# Patient Record
Sex: Male | Born: 1980 | Race: White | Hispanic: No | Marital: Married | Smoking: Former smoker
Health system: Southern US, Community
[De-identification: ages and names within clinical notes are randomized; demographics above are authoritative.]

## PROBLEM LIST (undated history)

## (undated) DIAGNOSIS — F1910 Other psychoactive substance abuse, uncomplicated: Secondary | ICD-10-CM

## (undated) DIAGNOSIS — G43909 Migraine, unspecified, not intractable, without status migrainosus: Secondary | ICD-10-CM

## (undated) DIAGNOSIS — I10 Essential (primary) hypertension: Secondary | ICD-10-CM

## (undated) DIAGNOSIS — K449 Diaphragmatic hernia without obstruction or gangrene: Secondary | ICD-10-CM

## (undated) DIAGNOSIS — F419 Anxiety disorder, unspecified: Secondary | ICD-10-CM

## (undated) DIAGNOSIS — K269 Duodenal ulcer, unspecified as acute or chronic, without hemorrhage or perforation: Secondary | ICD-10-CM

## (undated) DIAGNOSIS — F322 Major depressive disorder, single episode, severe without psychotic features: Secondary | ICD-10-CM

## (undated) DIAGNOSIS — F1011 Alcohol abuse, in remission: Secondary | ICD-10-CM

## (undated) DIAGNOSIS — K259 Gastric ulcer, unspecified as acute or chronic, without hemorrhage or perforation: Secondary | ICD-10-CM

## (undated) HISTORY — DX: Other psychoactive substance abuse, uncomplicated: F19.10

## (undated) HISTORY — DX: Essential (primary) hypertension: I10

## (undated) HISTORY — PX: OTHER SURGICAL HISTORY: SHX169

## (undated) HISTORY — DX: Anxiety disorder, unspecified: F41.9

## (undated) HISTORY — PX: ORIF ANKLE FRACTURE: SHX5408

## (undated) HISTORY — PX: WISDOM TOOTH EXTRACTION: SHX21

## (undated) HISTORY — DX: Alcohol abuse, in remission: F10.11

## (undated) HISTORY — DX: Migraine, unspecified, not intractable, without status migrainosus: G43.909

## (undated) HISTORY — DX: Major depressive disorder, single episode, severe without psychotic features: F32.2

---
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
## 2001-01-30 ENCOUNTER — Ambulatory Visit (HOSPITAL_COMMUNITY): Admission: RE | Admit: 2001-01-30 | Discharge: 2001-01-30 | Payer: Self-pay | Admitting: Orthopedic Surgery

## 2001-08-27 ENCOUNTER — Emergency Department (HOSPITAL_COMMUNITY): Admission: EM | Admit: 2001-08-27 | Discharge: 2001-08-27 | Payer: Self-pay | Admitting: Emergency Medicine

## 2004-01-03 ENCOUNTER — Emergency Department (HOSPITAL_COMMUNITY): Admission: EM | Admit: 2004-01-03 | Discharge: 2004-01-03 | Payer: Self-pay

## 2004-01-03 ENCOUNTER — Emergency Department (HOSPITAL_COMMUNITY): Admission: EM | Admit: 2004-01-03 | Discharge: 2004-01-03 | Payer: Self-pay | Admitting: Emergency Medicine

## 2004-01-06 ENCOUNTER — Emergency Department (HOSPITAL_COMMUNITY): Admission: EM | Admit: 2004-01-06 | Discharge: 2004-01-06 | Payer: Self-pay | Admitting: Emergency Medicine

## 2004-01-10 ENCOUNTER — Emergency Department (HOSPITAL_COMMUNITY): Admission: EM | Admit: 2004-01-10 | Discharge: 2004-01-10 | Payer: Self-pay | Admitting: Emergency Medicine

## 2004-01-17 ENCOUNTER — Emergency Department (HOSPITAL_COMMUNITY): Admission: EM | Admit: 2004-01-17 | Discharge: 2004-01-17 | Payer: Self-pay | Admitting: Emergency Medicine

## 2004-01-31 ENCOUNTER — Emergency Department (HOSPITAL_COMMUNITY): Admission: EM | Admit: 2004-01-31 | Discharge: 2004-01-31 | Payer: Self-pay | Admitting: Emergency Medicine

## 2004-04-16 ENCOUNTER — Ambulatory Visit: Payer: Self-pay | Admitting: Internal Medicine

## 2004-08-28 ENCOUNTER — Ambulatory Visit: Payer: Self-pay | Admitting: Endocrinology

## 2005-01-17 ENCOUNTER — Ambulatory Visit: Payer: Self-pay | Admitting: Internal Medicine

## 2005-06-09 ENCOUNTER — Encounter: Admission: RE | Admit: 2005-06-09 | Discharge: 2005-06-09 | Payer: Self-pay | Admitting: Internal Medicine

## 2005-06-09 ENCOUNTER — Ambulatory Visit: Payer: Self-pay | Admitting: Internal Medicine

## 2005-09-22 ENCOUNTER — Emergency Department (HOSPITAL_COMMUNITY): Admission: EM | Admit: 2005-09-22 | Discharge: 2005-09-22 | Payer: Self-pay | Admitting: Emergency Medicine

## 2005-09-30 ENCOUNTER — Ambulatory Visit: Payer: Self-pay | Admitting: Internal Medicine

## 2006-05-21 ENCOUNTER — Ambulatory Visit: Payer: Self-pay | Admitting: Internal Medicine

## 2007-04-10 ENCOUNTER — Encounter: Payer: Self-pay | Admitting: *Deleted

## 2007-04-10 DIAGNOSIS — G43909 Migraine, unspecified, not intractable, without status migrainosus: Secondary | ICD-10-CM

## 2007-04-10 DIAGNOSIS — S61209A Unspecified open wound of unspecified finger without damage to nail, initial encounter: Secondary | ICD-10-CM | POA: Insufficient documentation

## 2007-04-10 HISTORY — DX: Migraine, unspecified, not intractable, without status migrainosus: G43.909

## 2009-07-04 ENCOUNTER — Encounter: Admission: RE | Admit: 2009-07-04 | Discharge: 2009-07-04 | Payer: Self-pay | Admitting: Sports Medicine

## 2009-07-04 ENCOUNTER — Encounter: Payer: Self-pay | Admitting: Internal Medicine

## 2009-09-13 ENCOUNTER — Ambulatory Visit: Payer: Self-pay | Admitting: Internal Medicine

## 2009-09-13 DIAGNOSIS — F322 Major depressive disorder, single episode, severe without psychotic features: Secondary | ICD-10-CM

## 2009-09-13 HISTORY — DX: Major depressive disorder, single episode, severe without psychotic features: F32.2

## 2009-10-02 ENCOUNTER — Telehealth: Payer: Self-pay | Admitting: Internal Medicine

## 2009-10-11 ENCOUNTER — Ambulatory Visit: Payer: Self-pay | Admitting: Internal Medicine

## 2010-04-25 NOTE — Assessment & Plan Note (Signed)
Summary: 3-4 wk f/u // #/ cd   Vital Signs:  Patient profile:   30 year old male Height:      70 inches Weight:      144 pounds BMI:     20.74 O2 Sat:      98 % on Room air Temp:     97.3 degrees F oral Pulse rate:   51 / minute BP sitting:   126 / 80  (left arm) Cuff size:   regular  Vitals Entered By: Bill Salinas CMA (October 11, 2009 3:11 PM)  O2 Flow:  Room air CC: follow-up visit/ ab   CC:  follow-up visit/ ab.  History of Present Illness: Patient presents for follow-up. He was started on fluoxetine June 23rd. In the interval the dose has been increased to 40mg  daily. He did have some diarrhea but has adjust his metamucil dose and now has a normal bowel pattern. He has not had any adverse effects and does report that he is doing better.   Current Medications (verified): 1)  Imitrex 100 Mg  Tabs (Sumatriptan Succinate) .... Take One Tablet As Needed 2)  Fluoxetine Hcl 40 Mg Caps (Fluoxetine Hcl) .Marland Kitchen.. 1 By Mouth Once Daily  Allergies (verified): No Known Drug Allergies PMH-FH-SH reviewed-no changes except otherwise noted  Review of Systems  The patient denies anorexia, weight loss, hoarseness, dyspnea on exertion, abdominal pain, and muscle weakness.    Physical Exam  General:  Well-developed,well-nourished,in no acute distress; alert,appropriate and cooperative throughout examination Eyes:  C&S clear Lungs:  normal respiratory effort.   Heart:  normal rate and regular rhythm.   Neurologic:  no tremor or restless leg Psych:  Oriented X3, good eye contact, and not anxious appearing.     Impression & Recommendations:  Problem # 1:  DEPRESSION, MAJOR, SEVERE (ICD-296.23) Doing much better on fluoxitine.   Plan - continue present meds for at least 4 months.           for sleep - zolpidem every 2nd or 3rd night.        Complete Medication List: 1)  Imitrex 100 Mg Tabs (Sumatriptan succinate) .... Take one tablet as needed 2)  Fluoxetine Hcl 40 Mg Caps (Fluoxetine  hcl) .Marland Kitchen.. 1 by mouth once daily 3)  Zolpidem Tartrate 10 Mg Tabs (Zolpidem tartrate) .Marland Kitchen.. 1 by mouth at bedtime as needed for insomnia Prescriptions: ZOLPIDEM TARTRATE 10 MG TABS (ZOLPIDEM TARTRATE) 1 by mouth at bedtime as needed for insomnia  #20 x 5   Entered and Authorized by:   Jacques Navy MD   Signed by:   Jacques Navy MD on 10/11/2009   Method used:   Print then Give to Patient   RxID:   [id number]

## 2010-04-25 NOTE — Progress Notes (Signed)
Summary: FLUOXETINE  Phone Note Call from Patient Call back at Home Phone [phone]   Summary of Call: Patient is requesting a call back regarding med given at last office visit.  Initial call taken by: Lamar Sprinkles, CMA,  October 02, 2009 11:46 AM  Follow-up for Phone Call        Spoke w/pt. He has been taking 2 fluoxetine daily in error, he did not read the bottle. Pt says that he is starting to feel relief from his depression. Pt is req rx for 2 pills daily, he is out of medication tomorrow.  Also c/o diarrhea from approx 4 am to 11 am daily since being on medication. Loose watery stools 5 to 6 daily. He has not taken the metmucil daily as directed at office visit. Advised to start and we would ask MD for further suggestions. He has tried to alternate time he takes meds w/no change in symptoms.  Follow-up by: Lamar Sprinkles, CMA,  October 02, 2009 4:32 PM  Additional Follow-up for Phone Call Additional follow up Details #1::        ok to rx fluoxetine 40mg  once daily. if diarrhea continues may need to change meds Additional Follow-up by: Jacques Navy MD,  October 02, 2009 6:45 PM    Additional Follow-up for Phone Call Additional follow up Details #2::    Pt informed, He says that he read the metamucil bottle and he was only taking 1 pill daily when it reads to take up to 5. He has started 2 metamucil pills two times a day and is starting to get relief from diarrhea.  Follow-up by: Lamar Sprinkles, CMA,  October 03, 2009 9:44 AM  New/Updated Medications: FLUOXETINE HCL 40 MG CAPS (FLUOXETINE HCL) 1 by mouth once daily Prescriptions: FLUOXETINE HCL 40 MG CAPS (FLUOXETINE HCL) 1 by mouth once daily  #90 x 1   Entered by:   Lamar Sprinkles, CMA   Authorized by:   Jacques Navy MD   Signed by:   Lamar Sprinkles, CMA on 10/03/2009   Method used:   [facility]  [place] [address]       [address]       [place], [place]  [address]       Ph: [phone] or  [phone]       Fax: [phone]   RxID:   [id number]

## 2010-04-25 NOTE — Assessment & Plan Note (Signed)
Summary: HEADACHE-- APPETITE LOSS--STC   Vital Signs:  Patient profile:   30 year old male Height:      70 inches Weight:      147 pounds BMI:     21.17 O2 Sat:      97 % on Room air Temp:     98.5 degrees F oral Pulse rate:   67 / minute BP sitting:   110 / 82  (left arm) Cuff size:   regular  Vitals Entered By: Bill Salinas CMA (September 13, 2009 3:43 PM)  O2 Flow:  Room air CC: Pt here with c/o migraines and diarrhea that he gets on a daily basis.He states that symptoms are gone by noon but leaves him extremely weak and fatigue. symptoms present x 4 weeks/ ab   CC:  Pt here with c/o migraines and diarrhea that he gets on a daily basis.He states that symptoms are gone by noon but leaves him extremely weak and fatigue. symptoms present x 4 weeks/ ab.  History of Present Illness: Patient presents ostensibly for morning headache and diarrhea. On interview his chief complaint is lack of sleep. He is not sure what is keeping him from sleeping: Donnald Garre is ok, work is ok, no new financial stress. He does admit to irritability, marked loss of appetite, decreased libido, disrupted sleep pattern, emotionality, difficulty with focus and attention and perseveration. He does have several loose stools in the morning and does have a fronto-occipital HA during the day. His fatigue and symptoms have resulted in many missed days of work.   He has been suffering with these symptoms for 4 weeks. He states that he used to think that seeking help for this type of problem was for men of weak character. He finally seeks help because of potential effect on his job and feeling miserable.  Current Medications (verified): 1)  Imitrex 100 Mg  Tabs (Sumatriptan Succinate) .... Take One Tablet As Needed  Allergies (verified): No Known Drug Allergies  Past History:  Past Medical History: Last updated: 04/10/2007 Hx of LACERATION OF FINGER (ICD-883.0) MIGRAINE HEADACHE (ICD-346.90)    Past Surgical  History: Last updated: 04/10/2007 * Hx of SURGICAL REPAIR FOR  LACERATION TO MCP  JOINT RIGHT THUMB.  Review of Systems       The patient complains of anorexia, weight loss, headaches, abdominal pain, and depression.  The patient denies fever, vision loss, chest pain, syncope, dyspnea on exertion, melena, hematochezia, severe indigestion/heartburn, muscle weakness, difficulty walking, abnormal bleeding, and enlarged lymph nodes.    Physical Exam  General:  Alert but anxious appearing thin white male in no acute distress Head:  normocephalic and atraumatic.   Eyes:  C&S clear Lungs:  normal respiratory effort.   Heart:  normal rate and regular rhythm.   Neurologic:  alert & oriented X3, cranial nerves II-XII intact, and gait normal.   Skin:  turgor normal and color normal.   Psych:  Oriented X3, memory intact for recent and remote, normally interactive, and poor eye contact.  Appears moderately anxious.   Impression & Recommendations:  Problem # 1:  DEPRESSION, MAJOR, SEVERE (ICD-296.23) Patient with strongly positive inventory of vegative signs of depression. He denies any suicidal ideation. Long discussion about depression: onset- physiologic causes, i.e. neurochemical imbalance and need for treatment with medications and possibly counseling.  Plan - fluoxetine 20mg  once a day           return in 3 weeks for follow-up.  Complete Medication List: 1)  Imitrex 100 Mg Tabs (Sumatriptan succinate) .... Take one tablet as needed 2)  Fluoxetine Hcl 20 Mg Caps (Fluoxetine hcl) .Marland Kitchen.. 1 by mouth daily  Patient Instructions: 1)  Depression - many symptoms of depression: irritability, poor appetite, change in sleep pattern, decrease sex drive, worry, lack of fun and pleasure, diffiuclty focusing. Plan - start on fluoxetine, a serotonin uptake inhibitor that will help resolve the symptoms. May need a higher dose. Come back for follow up in 3 -4 weeks, sooner for any problems. For the guts in  addition to the fluoxetine take metamusil or a similar product once a day in the evening.  Prescriptions: FLUOXETINE HCL 20 MG CAPS (FLUOXETINE HCL) 1 by mouth daily  #30 x 2   Entered and Authorized by:   Jacques Navy MD   Signed by:   Jacques Navy MD on 09/13/2009   Method used:   [facility]  [place] [address]       [address]       [place], [place]  [address]       Ph: [phone] or [phone]       Fax: [phone]   RxID:   [id number]

## 2010-04-25 NOTE — Letter (Signed)
Summary: [facility]  [facility]   Imported By: Lester  07/24/2009 11:44:52  _____________________________________________________________________  External Attachment:    Type:   Image     Comment:   External Document

## 2010-05-03 ENCOUNTER — Ambulatory Visit (HOSPITAL_BASED_OUTPATIENT_CLINIC_OR_DEPARTMENT_OTHER)
Admission: RE | Admit: 2010-05-03 | Discharge: 2010-05-03 | Disposition: A | Discharge status: Home / Self Care | Payer: Worker's Compensation | Source: Ambulatory Visit | Attending: Orthopedic Surgery | Admitting: Orthopedic Surgery

## 2010-05-03 DIAGNOSIS — X58XXXA Exposure to other specified factors, initial encounter: Secondary | ICD-10-CM | POA: Insufficient documentation

## 2010-05-03 DIAGNOSIS — S61209A Unspecified open wound of unspecified finger without damage to nail, initial encounter: Secondary | ICD-10-CM | POA: Insufficient documentation

## 2010-05-03 LAB — POCT HEMOGLOBIN-HEMACUE: Hemoglobin: 15.9 g/dL (ref 13.0–17.0)

## 2010-05-05 LAB — WOUND CULTURE
Culture: NO GROWTH
Gram Stain: NONE SEEN

## 2010-05-08 LAB — ANAEROBIC CULTURE: Gram Stain: NONE SEEN

## 2010-05-13 NOTE — Op Note (Signed)
  NAMERIGEL, FILSINGER              ACCOUNT NO.:  [id number]  MEDICAL RECORD NO.:  [id number]            PATIENT TYPE:  LOCATION:                                 FACILITY:  PHYSICIAN:  Cindee Salt, M.D.            DATE OF BIRTH:  DATE OF PROCEDURE:  05/03/2010 DATE OF DISCHARGE:                              OPERATIVE REPORT   PREOPERATIVE DIAGNOSIS:  Laceration, proximal interphalangeal joint, right little finger.  POSTOPERATIVE DIAGNOSIS:  Laceration, proximal interphalangeal joint, right little finger.  OPERATION:  Incision and drainage with cultures, proximal interphalangeal, right small finger.  It is noted that he has a laceration to the extensor tendon.  SURGEON:  Cindee Salt, MD  ANESTHESIA:  IV regional with local infiltration.  ANESTHESIOLOGIST:  Janetta Hora. Gelene Mink, MD  HISTORY:  The patient is a 30 year old male who suffered a laceration over the PIP joint of his right little finger.  This was not treated primarily.  He is seen approximately 24 hours later.  He complains of pain with resisted extension of his finger with swelling, pain with motion of PIP joint.  X-rays were negative.  He was advised of exploration, incision and drainage with debridement, irrigation of the PIP joint for an open joint.  Pre, peri, and postoperative course were discussed.  In the preoperative area, the patient is seen, extremity is marked by both the patient and surgeon, and antibiotic is given.  PROCEDURE:  The patient was brought to the operating room where a forearm-based IV regional anesthetic was carried out without difficulty. He was prepped using ChloraPrep, supine position, right arm free.  A 3- minute dry time was allowed.  A time-out was taken confirming the patient and procedure.  The wound was extended proximally and distally and carried down through the subcutaneous tissue.  A laceration of the extensor tendon was immediately apparent.  This was extended slightly  to gain increased access to the joint.  The joint was copiously irrigated with saline after cultures were taken for both aerobic and anaerobic cultures.  Again, the wound was irrigated.  A vessel loop drain was placed through the extensor tendon into the joint.  A sterile compressive dressing splint to the little finger was applied.  On deflation of the tourniquet, all fingers immediately pinked.  He was taken to the recovery room for observation in satisfactory condition.  He will be discharged to home to return to the [facility] [place] on Tuesday, on Talwin NX and Septra DS.          ______________________________ Cindee Salt, M.D.     GK/MEDQ  D:  05/03/2010  T:  05/04/2010  Job:  [id number]  Electronically Signed by Cindee Salt M.D. on 05/13/2010 02:25:57 PM

## 2010-05-27 ENCOUNTER — Ambulatory Visit (HOSPITAL_BASED_OUTPATIENT_CLINIC_OR_DEPARTMENT_OTHER)
Admission: RE | Admit: 2010-05-27 | Discharge: 2010-05-27 | Disposition: A | Discharge status: Home / Self Care | Payer: Worker's Compensation | Source: Ambulatory Visit | Attending: Orthopedic Surgery | Admitting: Orthopedic Surgery

## 2010-05-27 DIAGNOSIS — F17200 Nicotine dependence, unspecified, uncomplicated: Secondary | ICD-10-CM | POA: Insufficient documentation

## 2010-05-27 DIAGNOSIS — S61209A Unspecified open wound of unspecified finger without damage to nail, initial encounter: Secondary | ICD-10-CM | POA: Insufficient documentation

## 2010-05-27 DIAGNOSIS — G43909 Migraine, unspecified, not intractable, without status migrainosus: Secondary | ICD-10-CM | POA: Insufficient documentation

## 2010-05-27 DIAGNOSIS — X58XXXA Exposure to other specified factors, initial encounter: Secondary | ICD-10-CM | POA: Insufficient documentation

## 2010-06-19 LAB — AFB CULTURE WITH SMEAR (NOT AT ARMC)

## 2010-08-09 NOTE — Op Note (Signed)
[facility]  Patient:    Trevor Mendez, Trevor Mendez Visit Number: [id number] MRN: [id number]          Service Type: DSU Location: DAY Attending Physician:  Dominica Severin Dictated by:   Elisha Ponder, M.D. Admit Date:  01/30/2001 Discharge Date: 01/30/2001                             Operative Report  DATE OF BIRTH:  09/05/1980.  PREOPERATIVE DIAGNOSES:  Right thumb dorsal laceration, status post on-the-job injury with bone and tendon involvement.  POSTOPERATIVE DIAGNOSES:  Right thumb dorsal laceration, status post on-the-job injury with bone and tendon involvement (extensor pollicis longus laceration) and cutting injury down to the bone and periosteum with encroachment of the bone and periosteum, intact superficial radially nerve branches radially and ulnarly explored intraoperatively.  OPERATION/PROCEDURE: 1. Irrigation and debridement, right thumb, skin and subcutaneous tissue,    tendinous tissue and bone including the periosteum. 2. Repair extensor pollicis longus, dorsal right thumb with associated    sagittal band.  Repair as well with multiple Mersilene throws. 3. Exploration of the radial and ulnar superficial radial nerve branches.    This was noted to be intact.  SURGEON:  Elisha Ponder, M.D.  ASSISTANT:  Sherri Rad.  ANESTHESIA:  General.  COMPLICATIONS:  None.  TOURNIQUET TIME:  Less than an hour.  INDICATIONS:  This patient is a 30 year old male who sustained laceration to his dorsal thumb 48 hours ago.  He has been seen by myself.  I have counseled him regarding the risks and benefits of surgery including the risk of amputation, bleeding, anesthesia, damage to normal structures, further surgery, damage to nerves and/or infection.  With this in mind, he desires to proceed.  All questions had been encouraged and answered preoperatively.  OPERATIVE FINDINGS:  This patient had complete extensor pollicis longus laceration  as well as sagittal band disruptions.  He had encroachment down to the periosteum and including the bone of the first metacarpal.  He underwent I&D repair of the EPL and exploration of the superficial radial nerve radially and ulnarly.  These branches were noted to be intact and there was no major nerve damage.  DESCRIPTION OF PROCEDURE:  The patient was identified by myself and anesthesia, taken to the operative suite, underwent smooth induction of general anesthesia, laid supine, appropriately padded, prepped and draped in the usual sterile fashion.  The right upper extremity with Betadine scrub and paint.  Following this, the arm arm was elevated, tourniquet was insufflated to 250 mmHg.  Previous sutures placed in urgent care were removed and the patient then underwent a I&D of the skin and subcutaneous tissue, bone, periosteum and tendon.  The patient tolerated this without difficulty.  There were no complications.  There was no significant dirt in the wound.  I did remove 1 mm of skin edge including subcu.  Following this and copious I&D of the wound, the patient underwent exploration of the superficial radial nerve radially and ulnarly. These nerve branches were noted to be intact without significant abnormalities.  I did dissect both of these to show that they were not lacerated as the patient did have some preoperative numbness.  These were noted to be intact.  After exploration, the patient then underwent repair of the periosteum with interrupted 5-0 chromic followed by repair of the extensor pollicis longus and sagittal band with multiple interrupted Mersilene throws. He tolerated this  without difficulty.  He had excellent sound repair. Following this, the patient had the tourniquet deflated.  Hemostasis was obtained with bipolar electrocautery and further irrigation was applied to the wound.  The patient then underwent closure of the wound with interrupted Prolene.  He tolerated  the procedure well without difficulty. There were no complications and all sponge and instrument counts were reported as correct.  He was placed in a sterile dressing with thumb spica splint.  The thumb was in full extension and the wrist was in extension as well.  He tolerated this well.  He was transported to the recovery room in stable condition.  All sponge and instrument counts reported as correct.  DISPOSITION:  The patient will be monitored in recovery care area.  He will then be discharged home.  He will follow up in 10-14 days and notify us if there are any problems.  All questions have been encouraged and answered. Dictated by:   Elisha Ponder, M.D. Attending Physician:  Dominica Severin DD:  01/30/01 TD:  02/01/01 Job: [id number]

## 2010-08-09 NOTE — Assessment & Plan Note (Signed)
[facility]                                 ON-CALL NOTE   DARRIE, MACMILLAN                    MRN:          [id number]  DATE:11/10/2006                            DOB:          06/09/1980    The caller is Waunita Schooner.  Primary is Dr. Debby Bud.  Phone number [phone].   SUBJECTIVE:  Mr. Polak thought he was taking some Demerol earlier today  at around 6 p.m. for wisdom tooth extraction. He by accident took his  father's blood pressure medication Toprol XL 100 mg. He himself is also  on blood pressure medication and accidentally again later in the evening  took his 10 mg beta blocker. In addition to this he has also had 6 beers  this evening. His friend is calling now because he is sleeping. She has  been taking his pulse and it has been above 60 but she does not have a  way to check blood pressure at this time. She can get a blood pressure  cuff from his parents.   ASSESSMENT/PLAN:  I notified her that the Toprol XL will stay in his  system for 24 hours. If his blood pressure was going to be low it was  probably low now. If she can get the blood pressure cuff, and verify  that his blood pressure is not less than 90/60 then he is likely safe to  stay at home, and continue to follow blood pressure and pulse. I  encouraged her to have him elevate his feet above his heart. If his  blood pressure is low, I instructed her to take him to the emergency  room.     Kerby Nora, MD  Electronically Signed    AB/MedQ  DD: 11/10/2006  DT: 11/11/2006  Job #: [id number]

## 2010-10-11 NOTE — Op Note (Signed)
  [name], Trevor              ACCOUNT NO.:  [id number]  MEDICAL RECORD NO.:  [id number]          PATIENT TYPE:  LOCATION:                                 FACILITY:  PHYSICIAN:  Cindee Salt, M.D.            DATE OF BIRTH:  DATE OF PROCEDURE:  05/27/2010 DATE OF DISCHARGE:                              OPERATIVE REPORT   PREOPERATIVE DIAGNOSIS:  Delayed repair, extensor tendon, right little finger.  POSTOPERATIVE DIAGNOSIS:  Delayed repair, extensor tendon, right little finger.  OPERATION:  Delayed repair, extensor tendon, right little finger.  SURGEON:  Cindee Salt, MD  ASSISTANT:  None.  ANESTHESIA:  General with local infiltration.  ANESTHESIOLOGIST:  Bedelia Person, MD.  HISTORY:  The patient is a 30 year old male who suffered a laceration over the PIP joint, right little finger.  He did not seek medical attention initially with a delayed presentation.  Incision and drainage was performed with irrigation of the joint.  He is admitted now for repair to the extensor tendon.  Pre, peri, and postoperative course have been discussed along with risks and complications.  He is aware there is no guarantee with surgery, possibility of infection, recurrence injury to arteries, nerves, tendons, incomplete relief of symptoms, dystrophy, possibility of loss of mobility.  In the preoperative area, the patient is seen, the extremity marked by both the patient and surgeon. Antibiotic given.  PROCEDURE:  The patient was brought to the operating room where general anesthetic was carried out without difficulty, was prepped using ChloraPrep, supine position, right arm free.  A 3-minute dry time was allowed.  Time-out taken confirming the patient and procedure.  The limb was exsanguinated with an Esmarch bandage.  Tourniquet placed high on the arm  was inflated to 250 mmHg.  The old incision was opened, extended proximally and distally.  The laceration to the extensor tendon,  approximately half of the central slip was noted.  This was opened, irrigated.  He had some feeling.  A metacarpal block was given, 0.25% Marcaine without epinephrine, 7 mL was used.  The repair was then performed with figure-of-eight 4-0 Mersilene sutures.  The wound again irrigated and the skin closed with interrupted 5-0 Vicryl Rapide sutures.  Sterile compressive dressing and splint to the finger was applied. Deflation of the tourniquet, all fingers immediately pinked.  He was taken to the recovery room for observation in satisfactory condition. He will be discharged home to return to the [facility] of [place] in 1 week on Talwin NX.          ______________________________ Cindee Salt, M.D.     GK/MEDQ  D:  05/27/2010  T:  05/28/2010  Job:  [id number]  Electronically Signed by Cindee Salt M.D. on 10/11/2010 09:00:57 AM

## 2011-10-08 ENCOUNTER — Ambulatory Visit: Payer: Self-pay | Admitting: Internal Medicine

## 2011-10-08 ENCOUNTER — Telehealth: Payer: Self-pay

## 2011-10-08 NOTE — Telephone Encounter (Signed)
Spouse called stating that pt had appt scheduled today with VAL @ 1:30 for N/V/D but was unable to make it. Pt is requesting Rx suppository to treat nausea, last OV 10/11/2009, please advise.

## 2011-10-09 ENCOUNTER — Ambulatory Visit (INDEPENDENT_AMBULATORY_CARE_PROVIDER_SITE_OTHER): Payer: Managed Care, Other (non HMO) | Admitting: Internal Medicine

## 2011-10-09 ENCOUNTER — Other Ambulatory Visit (INDEPENDENT_AMBULATORY_CARE_PROVIDER_SITE_OTHER): Payer: Managed Care, Other (non HMO)

## 2011-10-09 ENCOUNTER — Encounter: Payer: Self-pay | Admitting: Internal Medicine

## 2011-10-09 VITALS — BP 132/102 | HR 65 | Temp 97.5°F | Ht 70.0 in | Wt 147.5 lb

## 2011-10-09 DIAGNOSIS — R112 Nausea with vomiting, unspecified: Secondary | ICD-10-CM

## 2011-10-09 DIAGNOSIS — F322 Major depressive disorder, single episode, severe without psychotic features: Secondary | ICD-10-CM

## 2011-10-09 DIAGNOSIS — I10 Essential (primary) hypertension: Secondary | ICD-10-CM

## 2011-10-09 DIAGNOSIS — R197 Diarrhea, unspecified: Secondary | ICD-10-CM | POA: Insufficient documentation

## 2011-10-09 HISTORY — DX: Essential (primary) hypertension: I10

## 2011-10-09 LAB — CBC WITH DIFFERENTIAL/PLATELET
Basophils Absolute: 0 10*3/uL (ref 0.0–0.1)
Basophils Relative: 0.3 % (ref 0.0–3.0)
Eosinophils Absolute: 0.2 10*3/uL (ref 0.0–0.7)
Eosinophils Relative: 1.9 % (ref 0.0–5.0)
HCT: 53 % — ABNORMAL HIGH (ref 39.0–52.0)
Hemoglobin: 17.6 g/dL — ABNORMAL HIGH (ref 13.0–17.0)
Lymphocytes Relative: 14.4 % (ref 12.0–46.0)
Lymphs Abs: 1.2 10*3/uL (ref 0.7–4.0)
MCHC: 33.2 g/dL (ref 30.0–36.0)
MCV: 87.9 fl (ref 78.0–100.0)
Monocytes Absolute: 0.8 10*3/uL (ref 0.1–1.0)
Monocytes Relative: 10 % (ref 3.0–12.0)
Neutro Abs: 6 10*3/uL (ref 1.4–7.7)
Neutrophils Relative %: 73.4 % (ref 43.0–77.0)
Platelets: 263 10*3/uL (ref 150.0–400.0)
RBC: 6.02 Mil/uL — ABNORMAL HIGH (ref 4.22–5.81)
RDW: 14.3 % (ref 11.5–14.6)
WBC: 8.2 10*3/uL (ref 4.5–10.5)

## 2011-10-09 LAB — HEPATIC FUNCTION PANEL
ALT: 37 U/L (ref 0–53)
AST: 30 U/L (ref 0–37)
Albumin: 4.8 g/dL (ref 3.5–5.2)
Alkaline Phosphatase: 71 U/L (ref 39–117)
Bilirubin, Direct: 0.3 mg/dL (ref 0.0–0.3)
Total Bilirubin: 2.5 mg/dL — ABNORMAL HIGH (ref 0.3–1.2)
Total Protein: 7.9 g/dL (ref 6.0–8.3)

## 2011-10-09 LAB — BASIC METABOLIC PANEL
BUN: 15 mg/dL (ref 6–23)
CO2: 27 mEq/L (ref 19–32)
Calcium: 9.8 mg/dL (ref 8.4–10.5)
Chloride: 99 mEq/L (ref 96–112)
Creatinine, Ser: 1.1 mg/dL (ref 0.4–1.5)
GFR: 81.01 mL/min (ref >60.00)
Glucose, Bld: 125 mg/dL — ABNORMAL HIGH (ref 70–99)
Potassium: 4.7 mEq/L (ref 3.5–5.1)
Sodium: 138 mEq/L (ref 135–145)

## 2011-10-09 MED ORDER — PROMETHAZINE HCL 25 MG PO TABS: 25.0000 mg | ORAL_TABLET | Freq: Four times a day (QID) | ORAL | Status: DC | PRN

## 2011-10-09 MED ORDER — AMLODIPINE BESYLATE 5 MG PO TABS: 5.0000 mg | ORAL_TABLET | Freq: Every day | ORAL | Status: DC

## 2011-10-09 MED ORDER — FLUOXETINE HCL 20 MG PO CAPS: 20.0000 mg | ORAL_CAPSULE | Freq: Every day | ORAL | Status: DC

## 2011-10-09 MED ORDER — DIPHENOXYLATE-ATROPINE 2.5-0.025 MG PO TABS: 1.0000 | ORAL_TABLET | Freq: Four times a day (QID) | ORAL | Status: AC | PRN

## 2011-10-09 NOTE — Telephone Encounter (Signed)
Ok for phenergan 25 mg suppository # 12, 1 pr q 6. STONGLY advise OV for his problem.

## 2011-10-09 NOTE — Assessment & Plan Note (Signed)
Incidental, for amlod 5 qd to start next wk when improved from current illness, f/u next visit with PCP

## 2011-10-09 NOTE — Assessment & Plan Note (Addendum)
Milder at this time, but start prozac 20 qd, f/u 4 wks, consider increase to 40 as was needed previously, declines need for counsseling at this time

## 2011-10-09 NOTE — Patient Instructions (Addendum)
Take all new medications as prescribed - the phenergan for nausea, and the lomotil for diarrhea The amlodipine for blood pressure, and the generic prozac can be started next Monday when you should start to feel better Please go to LAB in the Basement for the blood and/or urine tests to be done today You will be contacted by phone if any changes need to be made immediately.  Otherwise, you will receive a letter about your results with an explanation. You are given the work note today Please return in 1 month, to Dr Debby Bud

## 2011-10-09 NOTE — Progress Notes (Signed)
  Subjective:    Patient ID: Trevor Mendez, male    DOB: 12/13/1980, 31 y.o.   MRN: [id number]  HPI  Here with acute illness; awoke sat am (5 days ago) with n/v/d - watery without abd pain, blood, or fever;  Has felt occasional chill;  Denies urinary symptoms such as dysuria, frequency, urgency,or hematuria.  Also with significant fatigue, and has lost from 158 to 147 today with decreased po intake solids and liquids.  Denies worsening reflux, dysphagia, abd pain, n/v.  No back pain, HA, rash or joint pain.  No sick contacts at work or home.  No prior hx.  Incidently notes BP elevated several occasions at work before current illness over the past 6 mo, similar to BP today. Also with mild worsening depressive symptoms, fatigue, similar to major depression in the past  - tx with prozac 40 for 6 mo;  Denies worsening suicidal ideation, or panic Past Medical History  Diagnosis Date  . MIGRAINE HEADACHE 04/10/2007    Qualifier: Diagnosis of  By: Genelle Gather CMA, [place]    . DEPRESSION, MAJOR, SEVERE 09/13/2009    Qualifier: Diagnosis of  By: Debby Bud MD, Rosalyn Gess   . HTN (hypertension) 10/09/2011   Past Surgical History  Procedure Date  . Finger surgury     reports that he has been smoking.  He uses smokeless tobacco. He reports that he drinks alcohol. He reports that he does not use illicit drugs. family history includes Hypertension in his father and mother. Allergies  Allergen Reactions  . Codeine    No current outpatient prescriptions on file prior to visit.   Review of Systems Constitutional: Negative for diaphoresis  HENT: Negative for tinnitus.   Eyes: Negative for photophobia and visual disturbance.  Respiratory: Negative for choking and stridor.   Gastrointestinal: Negative for blood in stool.  Genitourinary: Negative for hematuria and decreased urine volume.  Musculoskeletal: Negative for gait problem.  Skin: Negative for color change and wound. no rash Neurological: Negative for  tremors and numbness.  Psychiatric/Behavioral: Negative for decreased concentration. The patient is not hyperactive.      Objective:   Physical Exam Blood pressure 132/102, pulse 65, temperature 97.5 F (36.4 C), temperature source Oral, height 5\' 10"  (1.778 m), weight 147 lb 8 oz (66.906 kg), SpO2 97.00%. Physical Exam  VS noted, fatigued, mild ill appearing Constitutional: Pt appears well-developed and well-nourished.  HENT: Head: Normocephalic.  Right Ear: External ear normal.  Left Ear: External ear normal.  Bilat tm's mild erythema.  Sinus nontender.  Pharynx mild erythema Eyes: Conjunctivae and EOM are normal. Pupils are equal, round, and reactive to light.  Neck: Normal range of motion. Neck supple.  Cardiovascular: Normal rate and regular rhythm.   Pulmonary/Chest: Effort normal and breath sounds normal.  Abd:  Soft, NT, non-distended, + BS -  benign Neurological: Pt is alert. No confused No joint swelling  Skin: Skin is warm. No erythema. No rash Psychiatric: Pt behavior is normal. Thought content normal. but + depressed affect    Assessment & Plan:

## 2011-10-09 NOTE — Assessment & Plan Note (Signed)
C/w gastroenteritis as above, prob viral, for prn lomotil, check cbc, for work note as well

## 2011-10-09 NOTE — Assessment & Plan Note (Signed)
5 days with watery diarrhea, o/w bening exam - c/w prob acute gastroenteritis, prob viral most likely, for bmet today but o/w phenergan prn

## 2011-10-10 NOTE — Telephone Encounter (Signed)
Patient states saw Dr. Milton Ferguson for this problem and is feeling better

## 2011-11-11 ENCOUNTER — Ambulatory Visit (INDEPENDENT_AMBULATORY_CARE_PROVIDER_SITE_OTHER): Payer: Managed Care, Other (non HMO) | Admitting: Internal Medicine

## 2011-11-11 DIAGNOSIS — I10 Essential (primary) hypertension: Secondary | ICD-10-CM

## 2011-11-11 DIAGNOSIS — F322 Major depressive disorder, single episode, severe without psychotic features: Secondary | ICD-10-CM

## 2011-11-11 MED ORDER — HYDROCHLOROTHIAZIDE 12.5 MG PO CAPS: 12.5000 mg | ORAL_CAPSULE | Freq: Every day | ORAL | Status: DC

## 2011-11-11 NOTE — Patient Instructions (Addendum)
Blood pressure - not quite there - need to be 130's over 80's. Plan - add HCTZ 12.,5 mg once a day to the amlodipine           Continue to check BP at home - call in if the numbers are 1`40+ over 90+ and we can make adjustments without an office visit  Psych - glad you are doing better on the prozac. In terms of "what else you can do" give serious consideration to short term problem focused counseling to understand what is driving you and tools you can add to your tool box in order to cope better and possibly do better on less or no medication. IT IS HARD WORK TO DO COUNSELING!!! You have to be brave and be willing to face you inner demons.

## 2011-11-11 NOTE — Progress Notes (Signed)
  Subjective:    Patient ID: Scarlette Ar, male    DOB: [birth year], 31 y.o.   MRN: [id number]  HPI Mr. Stohr returns for follow-up hypertension- recently started on Amlodipine 5 mg. He continues to have DBP with SBP in the 140 range.   He feels that he is doing better on the Prozac 20 mg bid.   Past Medical History  Diagnosis Date  . MIGRAINE HEADACHE 04/10/2007    Qualifier: Diagnosis of  By: Genelle Gather CMA, [place]    . DEPRESSION, MAJOR, SEVERE 09/13/2009    Qualifier: Diagnosis of  By: Debby Bud MD, Rosalyn Gess   . HTN (hypertension) 10/09/2011   Past Surgical History  Procedure Date  . Finger surgury    Family History  Problem Relation Age of Onset  . Hypertension Mother   . Hypertension Father    History   Social History  . Marital Status: Married    Spouse Name: N/A    Number of Children: 1  . 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 Smoking status: Current Everyday Smoker  . Smokeless tobacco: Current User  . Alcohol Use: Yes     occasoinal social  . Drug Use: No  . Sexually Active: Yes -- Male partner(s)   Other Topics Concern  . 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   Current Outpatient Prescriptions on File Prior to Visit  Medication Sig Dispense Refill  . amLODipine (NORVASC) 5 MG tablet Take 1 tablet (5 mg total) by mouth daily.  90 tablet  3  . FLUoxetine (PROZAC) 20 MG capsule Take 1 capsule (20 mg total) by mouth daily.  90 capsule  3  . hydrochlorothiazide (MICROZIDE) 12.5 MG capsule Take 1 capsule (12.5 mg total) by mouth daily.  30 capsule  11  . promethazine (PHENERGAN) 25 MG tablet Take 1 tablet (25 mg total) by mouth every 6 (six) hours as needed for nausea.  40 tablet  0       Review of Systems System review is negative for any constitutional, cardiac, pulmonary, GI or neuro symptoms or complaints other  than as described in the HPI.     Objective:   Physical Exam BP 150/100, on repeat 148/88 Gen'l - WNWD white man in no distress HEENT- C&S cler Cor- RRR Pulm normal respirations Pscyh - calm, animated affect       Assessment & Plan:

## 2011-11-12 ENCOUNTER — Encounter: Payer: Self-pay | Admitting: Internal Medicine

## 2011-11-12 MED ORDER — FLUOXETINE HCL 20 MG PO CAPS: 20.0000 mg | ORAL_CAPSULE | Freq: Two times a day (BID) | ORAL | Status: DC

## 2011-11-12 NOTE — Assessment & Plan Note (Signed)
Strong family history of HTN. Few life-style factors. Tolerating medication but not at good level of control. He asked about duration of therapy - given lack of modifiable life style issues and family history he is informed of the probable need for life-time therapy. Discussed the chronic nature of disease and the long term effects.  Plan Continue amlodipine 5 mg daily  Add HCTZ 12.5 mg daily  Monitor BP at home and report back.

## 2011-11-12 NOTE — Assessment & Plan Note (Signed)
Recurrent depression. He has plenty of stressor but there may be a neurochemical basis. He is interested in adjunct treatments to medication. Discussed the value of therapeutic counseling and how it may help with insight and development of helpful coping mechanisms.  Plan - continue Prozac 20 mg BID  He will consider counseling and investigate his insurance coverage then get back in touch.  Follow-up in 3 months

## 2011-11-20 ENCOUNTER — Other Ambulatory Visit: Payer: Self-pay | Admitting: *Deleted

## 2011-11-20 DIAGNOSIS — F322 Major depressive disorder, single episode, severe without psychotic features: Secondary | ICD-10-CM

## 2011-11-20 MED ORDER — FLUOXETINE HCL 20 MG PO CAPS: 20.0000 mg | ORAL_CAPSULE | Freq: Two times a day (BID) | ORAL | Status: DC

## 2011-11-20 NOTE — Telephone Encounter (Signed)
Received fax stating pt states fluoxetine has been increase to two capsule daily. Need new rx .../LMB

## 2011-11-26 ENCOUNTER — Encounter: Payer: Self-pay | Admitting: Internal Medicine

## 2011-11-26 ENCOUNTER — Ambulatory Visit (INDEPENDENT_AMBULATORY_CARE_PROVIDER_SITE_OTHER): Payer: Managed Care, Other (non HMO) | Admitting: Internal Medicine

## 2011-11-26 VITALS — BP 104/82 | HR 86 | Temp 98.7°F | Resp 16 | Wt 148.0 lb

## 2011-11-26 DIAGNOSIS — I10 Essential (primary) hypertension: Secondary | ICD-10-CM

## 2011-11-26 DIAGNOSIS — F322 Major depressive disorder, single episode, severe without psychotic features: Secondary | ICD-10-CM

## 2011-11-26 DIAGNOSIS — G43909 Migraine, unspecified, not intractable, without status migrainosus: Secondary | ICD-10-CM

## 2011-11-26 MED ORDER — KETOROLAC TROMETHAMINE 10 MG PO TABS: 10.0000 mg | ORAL_TABLET | Freq: Four times a day (QID) | ORAL | Status: AC | PRN

## 2011-11-26 MED ORDER — ALPRAZOLAM 0.5 MG PO TABS: 0.5000 mg | ORAL_TABLET | Freq: Four times a day (QID) | ORAL | Status: AC | PRN

## 2011-11-26 NOTE — Patient Instructions (Addendum)
Headache and symptoms that are consistent with anxiety with panic. Plan For headache pain take ketorolac 10 mg every 6 hours as needed  For breakthrough anxiety take alprazolam 0.5 mg every 6 hours as needed; for panic take 1 mg (2 tabs).  Anxiety and Panic Attacks Your caregiver has informed you that you are having an anxiety or panic attack. There may be many forms of this. Most of the time these attacks come suddenly and without warning. They come at any time of day, including periods of sleep, and at any time of life. They may be strong and unexplained. Although panic attacks are very scary, they are physically harmless. Sometimes the cause of your anxiety is not known. Anxiety is a protective mechanism of the body in its fight or flight mechanism. Most of these perceived danger situations are actually nonphysical situations (such as anxiety over losing a job). CAUSES   The causes of an anxiety or panic attack are many. Panic attacks may occur in otherwise healthy people given a certain set of circumstances. There may be a genetic cause for panic attacks. Some medications may also have anxiety as a side effect. SYMPTOMS   Some of the most common feelings are:  Intense terror.   Dizziness, feeling faint.   Hot and cold flashes.   Fear of going crazy.   Feelings that nothing is real.   Sweating.   Shaking.   Chest pain or a fast heartbeat (palpitations).   Smothering, choking sensations.   Feelings of impending doom and that death is near.   Tingling of extremities, this may be from over-breathing.   Altered reality (derealization).   Being detached from yourself (depersonalization).  Several symptoms can be present to make up anxiety or panic attacks. DIAGNOSIS   The evaluation by your caregiver will depend on the type of symptoms you are experiencing. The diagnosis of anxiety or panic attack is made when no physical illness can be determined to be a cause of the  symptoms. TREATMENT   Treatment to prevent anxiety and panic attacks may include:  Avoidance of circumstances that cause anxiety.   Reassurance and relaxation.   Regular exercise.   Relaxation therapies, such as yoga.   Psychotherapy with a psychiatrist or therapist.   Avoidance of caffeine, alcohol and illegal drugs.   Prescribed medication.  SEEK IMMEDIATE MEDICAL CARE IF:    You experience panic attack symptoms that are different than your usual symptoms.   You have any worsening or concerning symptoms.  Document Released: 03/10/2005 Document Revised: 02/27/2011 Document Reviewed: 07/12/2009 [facility] Patient Information 2012 [place], [place].

## 2011-11-29 NOTE — Assessment & Plan Note (Signed)
Patient with depression now with evidence of anxiety with panic - sense of being overwhelmed and that his life is coming apart. He is in a difficult situation with a new child and a job he doesn't like. He has his worst times in the early morning before having to go to work. Currently on prozac 40 mg  Plan Continue prozac  Add alprazolam 0.5 mg prn anxiety, 1 mg for panic.  Consider counseling

## 2011-11-29 NOTE — Assessment & Plan Note (Signed)
Presentation today is not like his typical migraine - suspect tension HA  Plan  Ketorolac 10 mg q 8 prn

## 2011-11-29 NOTE — Progress Notes (Signed)
  Subjective:    Patient ID: Trevor Mendez, male    DOB: 05-13-1980, 31 y.o.   MRN: [id number]  HPI [name] returns for evaluation of continued HA. This is different from his usual migrain and describes a pressure like sensation that he has trouble characterizng. The pain has been present for several days. He has no photophobia, visual changes, N/V, paresthesia. He denies any sinus symptoms: rhinorrhea, fever. He does have considerable stress related to family responsibilities and a work situation that is difficult for him - considering leaving this job.   Past Medical History  Diagnosis Date  . MIGRAINE HEADACHE 04/10/2007    Qualifier: Diagnosis of  By: Genelle Gather CMA, [place]    . DEPRESSION, MAJOR, SEVERE 09/13/2009    Qualifier: Diagnosis of  By: Debby Bud MD, Rosalyn Gess   . HTN (hypertension) 10/09/2011   Past Surgical History  Procedure Date  . Finger surgury    Family History  Problem Relation Age of Onset  . Hypertension Mother   . Hypertension Father    History   Social History  . Marital Status: Married    Spouse Name: N/A    Number of Children: 1  . 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 Smoking status: Current Everyday Smoker  . Smokeless tobacco: Current User  . Alcohol Use: Yes     occasoinal social  . Drug Use: No  . Sexually Active: Yes -- Male partner(s)   Other Topics Concern  . 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    Current Outpatient Prescriptions on File Prior to Visit  Medication Sig Dispense Refill  . amLODipine (NORVASC) 5 MG tablet Take 1 tablet (5 mg total) by mouth daily.  90 tablet  3  . FLUoxetine (PROZAC) 20 MG capsule Take 1 capsule (20 mg total) by mouth 2 (two) times daily.  60 capsule  3  . hydrochlorothiazide (MICROZIDE) 12.5 MG capsule Take 1 capsule (12.5 mg total) by mouth daily.  30  capsule  11  . promethazine (PHENERGAN) 25 MG tablet Take 1 tablet (25 mg total) by mouth every 6 (six) hours as needed for nausea.  40 tablet  0      Review of Systems System review is negative for any constitutional, cardiac, pulmonary, GI or neuro symptoms or complaints other than as described in the HPI.     Objective:   Physical Exam Filed Vitals:   11/26/11 1707  BP: 104/82  Pulse: 86  Temp: 98.7 F (37.1 C)  Resp: 16   WNWD white man in no acute distress HEENT- C&S clear, PERRLA Cor- RRR Pulm - normal respirations Neuro - A&O x 3, no focal deficits.       Assessment & Plan:

## 2011-11-29 NOTE — Assessment & Plan Note (Signed)
BP Readings from Last 3 Encounters:  11/26/11 104/82  10/09/11 132/102  10/11/09 126/80   Good control

## 2012-02-25 ENCOUNTER — Telehealth: Payer: Self-pay | Admitting: Internal Medicine

## 2012-02-25 ENCOUNTER — Emergency Department (HOSPITAL_COMMUNITY)
Admission: EM | Admit: 2012-02-25 | Discharge: 2012-02-25 | Disposition: A | Discharge status: Home / Self Care | Payer: Managed Care, Other (non HMO) | Attending: Emergency Medicine | Admitting: Emergency Medicine

## 2012-02-25 ENCOUNTER — Other Ambulatory Visit: Payer: Self-pay | Admitting: *Deleted

## 2012-02-25 ENCOUNTER — Emergency Department (HOSPITAL_COMMUNITY): Payer: Managed Care, Other (non HMO)

## 2012-02-25 ENCOUNTER — Encounter (HOSPITAL_COMMUNITY): Payer: Self-pay

## 2012-02-25 DIAGNOSIS — R5381 Other malaise: Secondary | ICD-10-CM | POA: Insufficient documentation

## 2012-02-25 DIAGNOSIS — R0789 Other chest pain: Secondary | ICD-10-CM

## 2012-02-25 DIAGNOSIS — R42 Dizziness and giddiness: Secondary | ICD-10-CM | POA: Insufficient documentation

## 2012-02-25 DIAGNOSIS — Z7982 Long term (current) use of aspirin: Secondary | ICD-10-CM | POA: Insufficient documentation

## 2012-02-25 DIAGNOSIS — I10 Essential (primary) hypertension: Secondary | ICD-10-CM | POA: Insufficient documentation

## 2012-02-25 DIAGNOSIS — F322 Major depressive disorder, single episode, severe without psychotic features: Secondary | ICD-10-CM | POA: Insufficient documentation

## 2012-02-25 DIAGNOSIS — Z79899 Other long term (current) drug therapy: Secondary | ICD-10-CM | POA: Insufficient documentation

## 2012-02-25 DIAGNOSIS — Z8669 Personal history of other diseases of the nervous system and sense organs: Secondary | ICD-10-CM | POA: Insufficient documentation

## 2012-02-25 DIAGNOSIS — F17200 Nicotine dependence, unspecified, uncomplicated: Secondary | ICD-10-CM | POA: Insufficient documentation

## 2012-02-25 DIAGNOSIS — F410 Panic disorder [episodic paroxysmal anxiety] without agoraphobia: Secondary | ICD-10-CM | POA: Insufficient documentation

## 2012-02-25 DIAGNOSIS — R209 Unspecified disturbances of skin sensation: Secondary | ICD-10-CM | POA: Insufficient documentation

## 2012-02-25 LAB — CBC WITH DIFFERENTIAL/PLATELET
Eosinophils Absolute: 0.1 10*3/uL (ref 0.0–0.7)
HCT: 45.3 % (ref 39.0–52.0)
Hemoglobin: 15.8 g/dL (ref 13.0–17.0)
Lymphs Abs: 1.3 10*3/uL (ref 0.7–4.0)
MCH: 30.2 pg (ref 26.0–34.0)
MCV: 86.6 fL (ref 78.0–100.0)
Monocytes Absolute: 1 10*3/uL (ref 0.1–1.0)
Monocytes Relative: 13 % — ABNORMAL HIGH (ref 3–12)
Neutrophils Relative %: 70 % (ref 43–77)
RBC: 5.23 MIL/uL (ref 4.22–5.81)

## 2012-02-25 LAB — POCT I-STAT TROPONIN I: Troponin i, poc: 0 ng/mL (ref 0.00–0.08)

## 2012-02-25 LAB — BASIC METABOLIC PANEL
BUN: 14 mg/dL (ref 6–23)
Creatinine, Ser: 0.82 mg/dL (ref 0.50–1.35)
GFR calc non Af Amer: >90 mL/min (ref >90)
Glucose, Bld: 85 mg/dL (ref 70–99)
Potassium: 3.4 mEq/L — ABNORMAL LOW (ref 3.5–5.1)

## 2012-02-25 MED ORDER — POTASSIUM CHLORIDE CRYS ER 20 MEQ PO TBCR
40.0000 meq | EXTENDED_RELEASE_TABLET | Freq: Once | ORAL | Status: AC
  Administered 2012-02-25: 40 meq via ORAL
  Filled 2012-02-25: qty 2

## 2012-02-25 MED ORDER — ASPIRIN 81 MG PO CHEW
324.0000 mg | CHEWABLE_TABLET | Freq: Once | ORAL | Status: AC
  Administered 2012-02-25: 324 mg via ORAL
  Filled 2012-02-25: qty 4

## 2012-02-25 MED ORDER — ALPRAZOLAM 0.5 MG PO TABS: 0.5000 mg | ORAL_TABLET | Freq: Four times a day (QID) | ORAL | Status: DC | PRN

## 2012-02-25 MED ORDER — DIAZEPAM 5 MG/ML IJ SOLN
5.0000 mg | Freq: Once | INTRAMUSCULAR | Status: AC
  Administered 2012-02-25: 5 mg via INTRAVENOUS
  Filled 2012-02-25: qty 2

## 2012-02-25 NOTE — ED Notes (Signed)
Pt sts the back of his neck starting hurting yesterday but took meds and it went away.  Pt sts this morning he started feeling pressure in his chest and had a feeling of something not being right throughout his body.  Pt sts his tongue also feel thick

## 2012-02-25 NOTE — Telephone Encounter (Signed)
Patient went to [facility] ED

## 2012-02-25 NOTE — Telephone Encounter (Signed)
Pt is having problems with depression and anxiety.  He cannot sleep.  BP is 145/90.  Nerves are shot.  Wants to come in today.

## 2012-02-25 NOTE — ED Notes (Addendum)
Since 3am today pt states dizziness, central chest pressure, pressure to lower back of head, feels like he is "spinning out of control" rates pain 6/10. Currently has dizziness, headache and intermittent chest pressure. A&Ox4, ambulatory, VSS. EKG NSR.

## 2012-02-25 NOTE — ED Provider Notes (Addendum)
History     CSN: [id number]  Arrival date & time 02/25/12  1054   First MD Initiated Contact with Patient 02/25/12 1137      Chief Complaint  Patient presents with  . Chest Pain  . Dizziness  . Headache    (Consider location/radiation/quality/duration/timing/severity/associated sxs/prior treatment) HPI Comments: Mr. Pickrel presents for evaluation of a chest discomfort.  He reports this is his second missed day of work in less than a month for similar discomfort.  He states he feels dizzy and out of control.  This is associate with chest tightness, numbness and tingling in his hands, and a headache.  He states he has been treated previously for both migraine and tension headaches and has gotten to a point where he can usually distinguish one type of headache from the other but his current discomfort is different from both.  He denies any drug use or alcohol abuse, but he does smoke.  He denies any family history of blood clots or early heart disease.  The history is provided by the patient. No language interpreter was used.    Past Medical History  Diagnosis Date  . MIGRAINE HEADACHE 04/10/2007    Qualifier: Diagnosis of  By: Genelle Gather CMA, [place]    . DEPRESSION, MAJOR, SEVERE 09/13/2009    Qualifier: Diagnosis of  By: Debby Bud MD, Rosalyn Gess   . HTN (hypertension) 10/09/2011    Past Surgical History  Procedure Date  . Finger surgury     Family History  Problem Relation Age of Onset  . Hypertension Mother   . Hypertension Father     History  Substance Use Topics  . Smoking status: Current Every Day Smoker  . Smokeless tobacco: Current User  . Alcohol Use: Yes     Comment: occasoinal social      Review of Systems  Constitutional: Positive for fatigue. Negative for fever, chills, diaphoresis, activity change and appetite change.  HENT: Negative.   Eyes: Negative.   Respiratory: Positive for chest tightness. Negative for cough, choking, shortness of breath, wheezing and  stridor.   Cardiovascular: Negative for chest pain, palpitations and leg swelling.  Gastrointestinal: Negative for nausea, vomiting, abdominal pain and diarrhea.  Genitourinary: Negative.   Musculoskeletal: Negative.   Skin: Negative.   Neurological: Positive for dizziness, light-headedness and numbness. Negative for tremors, seizures, syncope, facial asymmetry, speech difficulty, weakness and headaches.  Hematological: Negative.   Psychiatric/Behavioral: Positive for dysphoric mood. Negative for suicidal ideas, hallucinations, behavioral problems, confusion, self-injury, decreased concentration and agitation. The patient is nervous/anxious. The patient is not hyperactive.     Allergies  Codeine  Home Medications   Current Outpatient Rx  Name  Route  Sig  Dispense  Refill  . AMLODIPINE BESYLATE 5 MG PO TABS   Oral   Take 1 tablet (5 mg total) by mouth daily.   90 tablet   3   . FLUOXETINE HCL 20 MG PO CAPS   Oral   Take 1 capsule (20 mg total) by mouth 2 (two) times daily.   60 capsule   3   . HYDROCHLOROTHIAZIDE 12.5 MG PO CAPS   Oral   Take 1 capsule (12.5 mg total) by mouth daily.   30 capsule   11   . PROMETHAZINE HCL 25 MG PO TABS   Oral   Take 1 tablet (25 mg total) by mouth every 6 (six) hours as needed for nausea.   40 tablet   0     BP 153/97  Pulse 70  Temp 97.9 F (36.6 C) (Oral)  Resp 18  SpO2 100%  Physical Exam  Nursing note and vitals reviewed. Constitutional: He is oriented to person, place, and time. He appears well-developed and well-nourished. No distress.  HENT:  Head: Normocephalic and atraumatic.  Right Ear: External ear normal.  Left Ear: External ear normal.  Nose: Nose normal.  Mouth/Throat: Oropharynx is clear and moist. No oropharyngeal exudate.  Eyes: Conjunctivae normal are normal. Pupils are equal, round, and reactive to light. Right eye exhibits no discharge. Left eye exhibits no discharge. No scleral icterus.  Neck: Normal  range of motion. Neck supple. No JVD present. No tracheal deviation present.  Cardiovascular: Normal rate, regular rhythm and intact distal pulses.  Exam reveals no gallop and no friction rub.   No murmur heard. Pulmonary/Chest: Effort normal and breath sounds normal. No stridor. No respiratory distress. He has no wheezes. He has no rales. He exhibits no tenderness.  Abdominal: Soft. Bowel sounds are normal. He exhibits no distension. There is no tenderness. There is no rebound and no guarding.  Musculoskeletal: Normal range of motion. He exhibits no edema and no tenderness.  Lymphadenopathy:    He has no cervical adenopathy.  Neurological: He is alert and oriented to person, place, and time. No cranial nerve deficit.  Skin: Skin is warm and dry. No rash noted. He is not diaphoretic. No erythema. No pallor.  Psychiatric: His behavior is normal. Judgment and thought content normal. His mood appears anxious. His affect is not angry, not blunt, not labile and not inappropriate. His speech is not rapid and/or pressured, not delayed and not tangential. He is not agitated, not aggressive, is not hyperactive, not slowed, not withdrawn, not actively hallucinating and not combative. Thought content is not paranoid and not delusional. Cognition and memory are normal. Cognition and memory are not impaired. He does not exhibit a depressed mood. He expresses no homicidal and no suicidal ideation. He expresses no suicidal plans and no homicidal plans. He is attentive.    ED Course  Procedures (including critical care time)   Labs Reviewed  CBC WITH DIFFERENTIAL  BASIC METABOLIC PANEL   No results found.   No diagnosis found.   Date: 02/25/2012  Rate: 84 bpm  Rhythm: sinus  QRS Axis: normal  Intervals: normal  ST/T Wave abnormalities: normal  Conduction Disutrbances:incomplete RBBB (RSR' in V1)  Narrative Interpretation:   Old EKG Reviewed: none available      MDM  Pt presents for  evaluation of nonspecific chest discomfort.  He appears acutely anxious, note elevated BP, NAD.  There is no evidence of acute ischemia on his EKG.  Will perform a routine screening evaluation consisting of basic labs, EKG, and CXR.  He has a hx of depression and describes symptoms that are less concerning for angina, thromboembolic, event, PTX, PNA, or dissection and more like a panic or anxiety disorder.  Will reassess as the results become available.  1525.  Pt stable, NAD.  Trop is now negative x2.  He is currently pain free.  BP is also improved.  His symptoms do not appear to be secondary to ischemic heart disease.  He is at low risk for PE by Well's criteria.  There is no PNA, PTX, or effusion on CXR.  Plan discharge home to follow-up with his PMD.  Will also provide him information to establish care with a mental health provider for further evaluation of anxiety.      Tanette Chauca T  Robin Petrakis, MD 02/25/12 1530  Tobin Chad, MD 03/12/12 [phone]

## 2012-02-26 ENCOUNTER — Encounter: Payer: Self-pay | Admitting: Internal Medicine

## 2012-02-26 ENCOUNTER — Ambulatory Visit (INDEPENDENT_AMBULATORY_CARE_PROVIDER_SITE_OTHER): Payer: Managed Care, Other (non HMO) | Admitting: Internal Medicine

## 2012-02-26 VITALS — BP 120/90 | HR 60 | Temp 98.8°F | Ht 70.0 in | Wt 150.4 lb

## 2012-02-26 DIAGNOSIS — F419 Anxiety disorder, unspecified: Secondary | ICD-10-CM

## 2012-02-26 DIAGNOSIS — F411 Generalized anxiety disorder: Secondary | ICD-10-CM

## 2012-02-26 MED ORDER — ALPRAZOLAM 0.5 MG PO TABS: 0.5000 mg | ORAL_TABLET | Freq: Four times a day (QID) | ORAL | Status: DC | PRN

## 2012-02-26 NOTE — Patient Instructions (Signed)

## 2012-02-26 NOTE — Progress Notes (Signed)
Subjective:    Patient ID: Trevor Mendez, male    DOB: 05-02-1980, 31 y.o.   MRN: [id number]  HPI  Pt presents to the clinic to f/u from his ER visit on. He went in with c/o chest pains. He was visably anxious. The drew labs and got an EKG to rule out cardiac ischemia. Ekg and troponins were all negative. They gave him some IV diazepam and he felt better after that. He was discharged with a diagnosis of anxiety. He feels like this was precipitated by running out of his xanax. 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 He masks the stress by drinking alcohol every night. He is ready to make some changes. He denies SI/HI.  Review of Systems      Past Medical History  Diagnosis Date  . MIGRAINE HEADACHE 04/10/2007    Qualifier: Diagnosis of  By: Genelle Gather CMA, [place]    . DEPRESSION, MAJOR, SEVERE 09/13/2009    Qualifier: Diagnosis of  By: Debby Bud MD, Rosalyn Gess   . HTN (hypertension) 10/09/2011    Current Outpatient Prescriptions  Medication Sig Dispense Refill  . ALPRAZolam (XANAX) 0.5 MG tablet Take 1 tablet (0.5 mg total) by mouth 4 (four) times daily as needed. For anxiety  90 tablet  3  . amLODipine (NORVASC) 5 MG tablet Take 5 mg by mouth daily.      . Aspirin-Acetaminophen-Caffeine (GOODY HEADACHE PO) Take 1 packet by mouth daily.      Marland Kitchen FLUoxetine (PROZAC) 20 MG capsule Take 20 mg by mouth 2 (two) times daily.      . hydrochlorothiazide (MICROZIDE) 12.5 MG capsule Take 12.5 mg by mouth daily.      . promethazine (PHENERGAN) 25 MG tablet Take 1 tablet (25 mg total) by mouth every 6 (six) hours as needed for nausea.  40 tablet  0   No current facility-administered medications for this visit.   Facility-Administered Medications Ordered in Other Visits  Medication Dose Route Frequency Provider Last Rate Last Dose  . [COMPLETED] potassium chloride SA (K-DUR,KLOR-CON) CR tablet 40 mEq  40 mEq Oral Once Tobin Chad, MD   40 mEq at  02/25/12 1453    Allergies  Allergen Reactions  . Codeine Nausea And Vomiting    Family History  Problem Relation Age of Onset  . Hypertension Mother   . Hypertension Father     History   Social History  . Marital Status: Married    Spouse Name: N/A    Number of Children: 1  . 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 Smoking status: Current Every Day Smoker  . Smokeless tobacco: Current User  . Alcohol Use: Yes     Comment: occasoinal social  . Drug Use: No  . Sexually Active: Yes -- Male partner(s)   Other Topics Concern  . 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     Constitutional: Denies fever, malaise, fatigue, headache or abrupt weight changes.  Respiratory: Denies difficulty breathing, shortness of breath, cough or sputum production.   Cardiovascular: Denies chest pain, chest tightness, palpitations or swelling in the hands or feet.  Skin: Denies redness, rashes, lesions or ulcercations.  Neurological: Pt reports anxiety and feeling nervous. Denies dizziness, difficulty with memory, difficulty with  speech or problems with balance and coordination.   No other specific complaints in a complete review of systems (except as listed in HPI above).  Objective:   Physical Exam  BP 120/90  Pulse 60  Temp 98.8 F (37.1 C) (Oral)  Ht 5\' 10"  (1.778 m)  Wt 150 lb 6.4 oz (68.221 kg)  BMI 21.58 kg/m2  SpO2 97% Wt Readings from Last 3 Encounters:  02/26/12 150 lb 6.4 oz (68.221 kg)  11/26/11 148 lb (67.132 kg)  10/09/11 147 lb 8 oz (66.906 kg)    General: Appears his stated age, well developed, well nourished in NAD.  Cardiovascular: Normal rate and rhythm. S1,S2 noted.  No murmur, rubs or gallops noted. No JVD or BLE edema. No carotid bruits noted. Pulmonary/Chest: Normal effort and positive vesicular breath sounds. No  respiratory distress. No wheezes, rales or ronchi noted.  Neurological: Alert and oriented. Cranial nerves II-XII intact. Coordination normal. +DTRs bilaterally. Psychiatric: Mood depressed and affect normal. Pt crying. Behavior is normal. Judgment and thought content normal.        Assessment & Plan:   Anxiety, new onset with additional workup required:  Continue taking Prozac as prescribed May take xanax up to three times a day as needed for anxiety Practice relaxation techniques such a visual distraction or deep breathing May benefit from cognitive behavioral therapy, will give names and number for psychiatrist in the area.  RTC as needed or if symptoms persist.

## 2012-04-01 ENCOUNTER — Other Ambulatory Visit: Payer: Self-pay | Admitting: Internal Medicine

## 2012-07-12 ENCOUNTER — Other Ambulatory Visit: Payer: Self-pay | Admitting: Internal Medicine

## 2012-07-13 NOTE — Telephone Encounter (Signed)
Faxed hardcopy to pharmacy. 

## 2012-07-13 NOTE — Telephone Encounter (Signed)
Done hardcopy to robin  

## 2012-08-20 ENCOUNTER — Ambulatory Visit (INDEPENDENT_AMBULATORY_CARE_PROVIDER_SITE_OTHER): Payer: Managed Care, Other (non HMO) | Admitting: Internal Medicine

## 2012-08-20 ENCOUNTER — Emergency Department (HOSPITAL_COMMUNITY)
Admission: EM | Admit: 2012-08-20 | Discharge: 2012-08-21 | Disposition: A | Discharge status: Home / Self Care | Payer: Managed Care, Other (non HMO) | Attending: Emergency Medicine | Admitting: Emergency Medicine

## 2012-08-20 ENCOUNTER — Encounter (HOSPITAL_COMMUNITY): Payer: Self-pay

## 2012-08-20 ENCOUNTER — Encounter: Payer: Self-pay | Admitting: Internal Medicine

## 2012-08-20 VITALS — BP 138/82 | HR 79 | Temp 97.8°F | Ht 70.0 in | Wt 148.0 lb

## 2012-08-20 DIAGNOSIS — G43909 Migraine, unspecified, not intractable, without status migrainosus: Secondary | ICD-10-CM | POA: Insufficient documentation

## 2012-08-20 DIAGNOSIS — F32A Depression, unspecified: Secondary | ICD-10-CM

## 2012-08-20 DIAGNOSIS — Z79899 Other long term (current) drug therapy: Secondary | ICD-10-CM | POA: Insufficient documentation

## 2012-08-20 DIAGNOSIS — I10 Essential (primary) hypertension: Secondary | ICD-10-CM | POA: Insufficient documentation

## 2012-08-20 DIAGNOSIS — F329 Major depressive disorder, single episode, unspecified: Secondary | ICD-10-CM

## 2012-08-20 DIAGNOSIS — F3289 Other specified depressive episodes: Secondary | ICD-10-CM | POA: Insufficient documentation

## 2012-08-20 DIAGNOSIS — F17200 Nicotine dependence, unspecified, uncomplicated: Secondary | ICD-10-CM | POA: Insufficient documentation

## 2012-08-20 DIAGNOSIS — R45851 Suicidal ideations: Secondary | ICD-10-CM

## 2012-08-20 DIAGNOSIS — F1010 Alcohol abuse, uncomplicated: Secondary | ICD-10-CM

## 2012-08-20 DIAGNOSIS — R440 Auditory hallucinations: Secondary | ICD-10-CM

## 2012-08-20 DIAGNOSIS — R443 Hallucinations, unspecified: Secondary | ICD-10-CM

## 2012-08-20 LAB — CBC
HCT: 44.5 % (ref 39.0–52.0)
Hemoglobin: 14.8 g/dL (ref 13.0–17.0)
MCH: 27.2 pg (ref 26.0–34.0)
MCHC: 33.3 g/dL (ref 30.0–36.0)
RDW: 16.1 % — ABNORMAL HIGH (ref 11.5–15.5)

## 2012-08-20 LAB — COMPREHENSIVE METABOLIC PANEL
Albumin: 3.8 g/dL (ref 3.5–5.2)
Alkaline Phosphatase: 64 U/L (ref 39–117)
BUN: 15 mg/dL (ref 6–23)
Calcium: 9.9 mg/dL (ref 8.4–10.5)
GFR calc Af Amer: >90 mL/min (ref >90)
Glucose, Bld: 108 mg/dL — ABNORMAL HIGH (ref 70–99)
Potassium: 3.8 mEq/L (ref 3.5–5.1)
Total Protein: 7.3 g/dL (ref 6.0–8.3)

## 2012-08-20 LAB — RAPID URINE DRUG SCREEN, HOSP PERFORMED
Amphetamines: NOT DETECTED
Benzodiazepines: POSITIVE — AB
Cocaine: NOT DETECTED
Opiates: NOT DETECTED

## 2012-08-20 LAB — ETHANOL: Alcohol, Ethyl (B): <11 mg/dL (ref 0–11)

## 2012-08-20 MED ORDER — LORAZEPAM 1 MG PO TABS: 1.0000 mg | ORAL_TABLET | Freq: Three times a day (TID) | ORAL | Status: DC | PRN

## 2012-08-20 MED ORDER — IBUPROFEN 600 MG PO TABS: 600.0000 mg | ORAL_TABLET | Freq: Three times a day (TID) | ORAL | Status: DC | PRN

## 2012-08-20 MED ORDER — AMLODIPINE BESYLATE 5 MG PO TABS
5.0000 mg | ORAL_TABLET | Freq: Every day | ORAL | Status: DC
  Administered 2012-08-21: 5 mg via ORAL
  Filled 2012-08-20: qty 1

## 2012-08-20 MED ORDER — LORAZEPAM 1 MG PO TABS
1.0000 mg | ORAL_TABLET | Freq: Four times a day (QID) | ORAL | Status: DC | PRN
  Administered 2012-08-20 – 2012-08-21 (×3): 1 mg via ORAL
  Filled 2012-08-20 (×3): qty 1

## 2012-08-20 MED ORDER — HYDROCHLOROTHIAZIDE 12.5 MG PO CAPS
12.5000 mg | ORAL_CAPSULE | Freq: Every day | ORAL | Status: DC
  Administered 2012-08-21: 12.5 mg via ORAL
  Filled 2012-08-20: qty 1

## 2012-08-20 MED ORDER — FLUOXETINE HCL 20 MG PO CAPS
20.0000 mg | ORAL_CAPSULE | Freq: Two times a day (BID) | ORAL | Status: DC
  Administered 2012-08-20 – 2012-08-21 (×2): 20 mg via ORAL
  Filled 2012-08-20 (×4): qty 1

## 2012-08-20 MED ORDER — ACETAMINOPHEN 325 MG PO TABS: 650.0000 mg | ORAL_TABLET | ORAL | Status: DC | PRN

## 2012-08-20 MED ORDER — THIAMINE HCL 100 MG/ML IJ SOLN: 100.0000 mg | Freq: Every day | INTRAMUSCULAR | Status: DC

## 2012-08-20 MED ORDER — ONDANSETRON HCL 4 MG PO TABS: 4.0000 mg | ORAL_TABLET | Freq: Three times a day (TID) | ORAL | Status: DC | PRN

## 2012-08-20 MED ORDER — FOLIC ACID 1 MG PO TABS
1.0000 mg | ORAL_TABLET | Freq: Every day | ORAL | Status: DC
  Administered 2012-08-20 – 2012-08-21 (×2): 1 mg via ORAL
  Filled 2012-08-20 (×3): qty 1

## 2012-08-20 MED ORDER — ADULT MULTIVITAMIN W/MINERALS CH
1.0000 | ORAL_TABLET | Freq: Every day | ORAL | Status: DC
  Administered 2012-08-20 – 2012-08-21 (×2): 1 via ORAL
  Filled 2012-08-20 (×2): qty 1

## 2012-08-20 MED ORDER — AMLODIPINE BESYLATE 5 MG PO TABS
5.0000 mg | ORAL_TABLET | Freq: Every day | ORAL | Status: DC
  Filled 2012-08-20: qty 1

## 2012-08-20 MED ORDER — VITAMIN B-1 100 MG PO TABS
100.0000 mg | ORAL_TABLET | Freq: Every day | ORAL | Status: DC
  Administered 2012-08-20 – 2012-08-21 (×2): 100 mg via ORAL
  Filled 2012-08-20 (×2): qty 1

## 2012-08-20 MED ORDER — LORAZEPAM 2 MG/ML IJ SOLN: 1.0000 mg | Freq: Four times a day (QID) | INTRAMUSCULAR | Status: DC | PRN

## 2012-08-20 MED ORDER — ALUM & MAG HYDROXIDE-SIMETH 200-200-20 MG/5ML PO SUSP: 30.0000 mL | ORAL | Status: DC | PRN

## 2012-08-20 MED ORDER — ZOLPIDEM TARTRATE 5 MG PO TABS
5.0000 mg | ORAL_TABLET | Freq: Every evening | ORAL | Status: DC | PRN
  Administered 2012-08-21: 5 mg via ORAL
  Filled 2012-08-20: qty 1

## 2012-08-20 NOTE — Progress Notes (Signed)
Subjective:    Patient ID: Trevor Mendez, male    DOB: [birth year], 32 y.o.   MRN: [id number]  HPI  Pt presents to the clinic today with c/o depression. This episode started 2 months ago.It has progressively gotten in the last 3 days.He has a history of depression in the past. He is on prozac and xanax for this. He feel like the meds are not working. He is hearing voices. He is not having visual hallucinations. He did have SI yesterday. He did sit on his couch staring at a gun on his table yesterday for a few hours. He is not sure what made him not pick it up. He has no HI. Currently he has no suicidal thought. He does realize that he needs help. He is reaching out today for the help he needs.  Review of Systems      Past Medical History  Diagnosis Date  . MIGRAINE HEADACHE 04/10/2007    Qualifier: Diagnosis of  By: Genelle Gather CMA, [place]    . DEPRESSION, MAJOR, SEVERE 09/13/2009    Qualifier: Diagnosis of  By: Debby Bud MD, Rosalyn Gess   . HTN (hypertension) 10/09/2011    Current Outpatient Prescriptions  Medication Sig Dispense Refill  . ALPRAZolam (XANAX) 0.5 MG tablet Take 1 tablet (0.5 mg total) by mouth 3 (three) times daily as needed for anxiety.  90 tablet  1  . amLODipine (NORVASC) 5 MG tablet Take 5 mg by mouth daily.      . Aspirin-Acetaminophen-Caffeine (GOODY HEADACHE PO) Take 1 packet by mouth daily.      Marland Kitchen FLUoxetine (PROZAC) 20 MG capsule TAKE ONE CAPSULE BY MOUTH TWICE A DAY  60 capsule  3  . hydrochlorothiazide (MICROZIDE) 12.5 MG capsule Take 12.5 mg by mouth daily.       No current facility-administered medications for this visit.    Allergies  Allergen Reactions  . Codeine Nausea And Vomiting    Family History  Problem Relation Age of Onset  . Hypertension Mother   . Hypertension Father     History   Social History  . Marital Status: Married    Spouse Name: N/A    Number of Children: 1  . 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 Smoking status: Current Every Day Smoker  . Smokeless tobacco: Current User  . Alcohol Use: Yes     Comment: occasoinal social  . Drug Use: No  . Sexually Active: Yes -- Male partner(s)   Other Topics Concern  . 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     Constitutional: Denies fever, malaise, fatigue, headache or abrupt weight changes.   Neurological: Denies dizziness, difficulty with memory, difficulty with speech or problems with balance and coordination. Psych: Pt reports depression and hearing voices. Denies current SI/HI.   No other specific complaints in a complete review of systems (except as listed in HPI above).  Objective:   Physical Exam  BP 138/82  Pulse 79  Temp(Src) 97.8 F (36.6 C) (Oral)  Ht 5\' 10"  (1.778 m)  Wt 148 lb (67.132 kg)  BMI 21.24 kg/m2  SpO2 97% Wt Readings from Last 3 Encounters:  08/20/12 148 lb (67.132 kg)  02/26/12 150 lb 6.4 oz (68.221 kg)  11/26/11 148 lb (67.132 kg)    General: Appears her stated age, well developed,  well nourished in NAD.  Cardiovascular: Normal rate and rhythm. S1,S2 noted.  No murmur, rubs or gallops noted. No JVD or BLE edema. No carotid bruits noted. Pulmonary/Chest: Normal effort and positive vesicular breath sounds. No respiratory distress. No wheezes, rales or ronchi noted.  Neurological: Alert and oriented. Cranial nerves II-XII intact. Coordination normal. +DTRs bilaterally. Psychiatric: Mood anxious and affectflat. Behavior is paranoid. Judgment and thought content normal.         Assessment & Plan:   Acute major Depressive episode with auditory hallucinations:  Pt transported to ED at [facility] long for further evaluation and management [facility] called, no beds available  Will f/u after your evaluation

## 2012-08-20 NOTE — ED Notes (Signed)
Aaox3.  Pt calm and cooperative.  Pt denies SI/HI/AH at this time.

## 2012-08-20 NOTE — ED Provider Notes (Signed)
History    32 year old male with depression. Ongoing issue for over a year. It has been worse in the last 2-3 months. Is not sure why though. No acute stressors that he can identify. He has been having increasing suicidal thoughts. Has been thinking about shooting himself. He does have access to firearms. Increasing alcohol use. He's been drinking consistently about a 12 pack of beer per day for the past several months. He did drink on a regular basis prior to this, but not this degree. Denies any drug use. Family history of suicide. Has spoken with his family doctor about these issues previously. He is on Prozac.  CSN: [id number]  Arrival date & time 08/20/12  1550   First MD Initiated Contact with Patient 08/20/12 1624      Chief Complaint  Patient presents with  . Medical Clearance  . Suicidal    (Consider location/radiation/quality/duration/timing/severity/associated sxs/prior treatment) HPI  Past Medical History  Diagnosis Date  . MIGRAINE HEADACHE 04/10/2007    Qualifier: Diagnosis of  By: Genelle Gather CMA, [place]    . DEPRESSION, MAJOR, SEVERE 09/13/2009    Qualifier: Diagnosis of  By: Debby Bud MD, Rosalyn Gess   . HTN (hypertension) 10/09/2011    Past Surgical History  Procedure Laterality Date  . Finger surgury      Family History  Problem Relation Age of Onset  . Hypertension Mother   . Hypertension Father     History  Substance Use Topics  . Smoking status: Current Every Day Smoker  . Smokeless tobacco: Current User  . Alcohol Use: Yes     Comment: everyday      Review of Systems  All systems reviewed and negative, other than as noted in HPI.  Allergies  Codeine  Home Medications   Current Outpatient Rx  Name  Route  Sig  Dispense  Refill  . ALPRAZolam (XANAX) 0.5 MG tablet   Oral   Take 1 tablet (0.5 mg total) by mouth 3 (three) times daily as needed for anxiety.   90 tablet   1   . amLODipine (NORVASC) 5 MG tablet   Oral   Take 5 mg by mouth  daily.         . Aspirin-Acetaminophen-Caffeine (GOODY HEADACHE PO)   Oral   Take 1 packet by mouth daily.         Marland Kitchen FLUoxetine (PROZAC) 20 MG capsule      TAKE ONE CAPSULE BY MOUTH TWICE A DAY   60 capsule   3   . hydrochlorothiazide (MICROZIDE) 12.5 MG capsule   Oral   Take 12.5 mg by mouth daily.           BP 136/89  Pulse 77  Temp(Src) 98.8 F (37.1 C) (Oral)  Resp 18  SpO2 99%  Physical Exam  Nursing note and vitals reviewed. Constitutional: He is oriented to person, place, and time. He appears well-developed and well-nourished. No distress.  HENT:  Head: Normocephalic and atraumatic.  Eyes: Conjunctivae are normal. Right eye exhibits no discharge. Left eye exhibits no discharge.  Neck: Neck supple.  Cardiovascular: Normal rate, regular rhythm and normal heart sounds.  Exam reveals no gallop and no friction rub.   No murmur heard. Pulmonary/Chest: Effort normal and breath sounds normal. No respiratory distress.  Abdominal: Soft. He exhibits no distension. There is no tenderness.  Musculoskeletal: He exhibits no edema and no tenderness.  Neurological: He is alert and oriented to person, place, and time. No cranial nerve deficit. He  exhibits normal muscle tone. Coordination normal.  Skin: Skin is warm and dry.  Psychiatric: He has a normal mood and affect. His behavior is normal. Thought content normal.  Patient is calm and cooperative. His speech is clear and content is appropriate. He does not appear to be responding to internal stimuli or cognitively impaired.     ED Course  Procedures (including critical care time)  Labs Reviewed  CBC  COMPREHENSIVE METABOLIC PANEL  ETHANOL  URINE RAPID DRUG SCREEN (HOSP PERFORMED)   No results found.   1. Depression   2. Suicidal ideation   3. Alcohol abuse       MDM  31yM with severe depression. Suicidal ideation. Apathetic. Plan and means to carry it out. Family history of suicide. Increasing ETOH use.  Certainly high risk. Psych consultation.       Raeford Razor, MD 08/23/12 1556

## 2012-08-20 NOTE — Patient Instructions (Signed)

## 2012-08-20 NOTE — ED Notes (Signed)
Pt admitted to room 43 at the Psych ED. States that he has had thoughts of hurting himself and that it does not bother him to think about his death. States that he has had a problem of depression for a long time, but has never dealt with it. Now that he feels his death would not bother him at all, it is frightening to him. Feels that he might act on it. Wants to be helped and feels unsafe outside of the hospital

## 2012-08-20 NOTE — ED Notes (Signed)
Pt sent by PCP d/t pt having SI episodes since yesterday, with no plan, pt a/o x4, pt cooperative at this time.

## 2012-08-21 DIAGNOSIS — F329 Major depressive disorder, single episode, unspecified: Secondary | ICD-10-CM

## 2012-08-21 NOTE — ED Notes (Signed)
ACT into see 

## 2012-08-21 NOTE — ED Notes (Signed)
Up to the bsthroom

## 2012-08-21 NOTE — ED Notes (Signed)
Pt ambulatory to dc window w/ NT, pt had no belongings.

## 2012-08-21 NOTE — ED Notes (Addendum)
Sitting quietly, feeling some better after ativan.  IOP information reviewed w/ pt, pt encouraged to contact them Monday am.  Pt reports that he is safe to go home, is not having suicidal thoughts.  Pt encouraged to return/seek treatment if he does not feel safe or has return fo the suicidal thoughts.  Pt verbalized understanding and reported that he would. Feeling better after the ativan

## 2012-08-21 NOTE — ED Notes (Signed)
Talking quietly w/ wife, requesting something for anxiety

## 2012-08-21 NOTE — ED Notes (Signed)
Pt's wife into see 

## 2012-08-21 NOTE — BH Assessment (Signed)
Assessment Note   Trevor Mendez is a 32 y.o. male who presents voluntarily to emerg dept with depression and SI, no plan or intent to harm self.  Pt tell this writer that he has been SI x4-6 wks w/no plan to harm self.  Pt says his stress level has increased because of personal financial issues, work related stress and he is the father of an 33 month old child.  Pt says, "there are two versions of me, the normal guy i used to be and the person that doesn't care; dr. Domenick Gong and mr hyde".  Pt says he's overwhelmed.   Pt admits self-medicating.  Pt.'s alcohol consumption has increased in the last 4-5 months, drinking 1-12 pack of beer daily, last drink was 2 days ago.  Pt consumed 10 beers and 4-5 shots.  Pt says he realized he had a problem when it didn't him that he was drinking so much.  Pt has family hx of alcohol; father and various members of his family had alcohol issues.       Pt has no past outpt/inpt mental health hx but states that he has a family hx of completed SI's by cousins in his family.  Pt denies AVH/HI.  Pt says he came to the hospital at the request of his primary care physician.  Pt describes his depressive sxs: isolating from family/friends, insomnia, crying spells at times,  Decreased desire to pursue activities and anxiety.  Pt says has tremors with anxiety and attempts to hide them.   Pt contracted for safety and is requested referrals for IOP and outpatient psychiatrists and therapists.  Pt was examined by [facility] PA--Charles Eloisa [facility].  Pt was provided outpatient referral for SA/Psych IOP =, will contact on 08/23/12 for enrollment.      Axis I: MDD, reccurent, severe w/o psych; Alcohol Abuse  Axis II: Deferred Axis III:  Past Medical History  Diagnosis Date  . MIGRAINE HEADACHE 04/10/2007    Qualifier: Diagnosis of  By: Genelle Gather CMA, [place]    . DEPRESSION, MAJOR, SEVERE 09/13/2009    Qualifier: Diagnosis of  By: Debby Bud MD, Rosalyn Gess   . HTN (hypertension) 10/09/2011   Axis IV:  economic problems, occupational problems, other psychosocial or environmental problems, problems related to social environment and problems with primary support group Axis V: 41-50 serious symptoms  Past Medical History:  Past Medical History  Diagnosis Date  . MIGRAINE HEADACHE 04/10/2007    Qualifier: Diagnosis of  By: Genelle Gather CMA, [place]    . DEPRESSION, MAJOR, SEVERE 09/13/2009    Qualifier: Diagnosis of  By: Debby Bud MD, Rosalyn Gess   . HTN (hypertension) 10/09/2011    Past Surgical History  Procedure Laterality Date  . Finger surgury      Family History:  Family History  Problem Relation Age of Onset  . Hypertension Mother   . Hypertension Father     Social History:  reports that he has been smoking.  He uses smokeless tobacco. He reports that  drinks alcohol. He reports that he does not use illicit drugs.  Additional Social History:  Alcohol / Drug Use Pain Medications: None  Prescriptions: See MAR  Over the Counter: None  History of alcohol / drug use?: Yes Longest period of sobriety (when/how long): None  Negative Consequences of Use: Personal relationships;Financial;Work / School Withdrawal Symptoms: Other (Comment) (No w/d sxs ) Substance #1 Name of Substance 1: Alcohol  1 - Age of First Use: 20's  1 - Amount (size/oz): 12  Pk  1 - Frequency: Daily  1 - Duration: 4-5 Months  1 - Last Use / Amount: 2 Days Ago   CIWA: CIWA-Ar BP: 118/70 mmHg Pulse Rate: 62 Nausea and Vomiting: no nausea and no vomiting Tactile Disturbances: none Tremor: no tremor Auditory Disturbances: not present Paroxysmal Sweats: no sweat visible Visual Disturbances: not present Anxiety: no anxiety, at ease Headache, Fullness in Head: none present Agitation: normal activity Orientation and Clouding of Sensorium: oriented and can do serial additions CIWA-Ar Total: 0 COWS:    Allergies:  Allergies  Allergen Reactions  . Codeine Nausea And Vomiting    Home Medications:  (Not in a  hospital admission)  OB/GYN Status:  No LMP for male patient.  General Assessment Data Location of Assessment: [facility] Living Arrangements: Spouse/significant other;Children (Wife and 83 month old child in the home ) Can pt return to current living arrangement?: Yes Admission Status: Voluntary Is patient capable of signing voluntary admission?: Yes Transfer from: Acute Hospital Referral Source: MD  Education Status Is patient currently in school?: No Current Grade: None  Highest grade of school patient has completed: None  Name of school: None  Contact person: None   Risk to self Suicidal Ideation: No-Not Currently/Within Last 6 Months Suicidal Intent: No-Not Currently/Within Last 6 Months Is patient at risk for suicide?: No Suicidal Plan?: No-Not Currently/Within Last 6 Months Access to Means: No What has been your use of drugs/alcohol within the last 12 months?: Abusing; alcohol  Previous Attempts/Gestures: No How many times?: 0 Other Self Harm Risks: None  Triggers for Past Attempts: None known Intentional Self Injurious Behavior: None Family Suicide History: Yes;See progress notes Recent stressful life event(s): Financial Problems;Other (Comment) (Work related stress; Family stress ) Persecutory voices/beliefs?: No Depression: Yes Depression Symptoms: Isolating;Loss of interest in usual pleasures;Fatigue;Insomnia Substance abuse history and/or treatment for substance abuse?: No Suicide prevention information given to non-admitted patients: Not applicable  Risk to Others Homicidal Ideation: No Thoughts of Harm to Others: No Current Homicidal Intent: No Current Homicidal Plan: No Access to Homicidal Means: No Identified Victim: None  History of harm to others?: No Assessment of Violence: None Noted Violent Behavior Description: None  Does patient have access to weapons?: No Criminal Charges Pending?: No Does patient have a court date:  No  Psychosis Hallucinations: None noted Delusions: None noted  Mental Status Report Appear/Hygiene: Other (Comment) (Appropriate ) Eye Contact: Good Motor Activity: Unremarkable Speech: Logical/coherent;Soft Level of Consciousness: Alert Mood: Depressed;Anxious;Sad Affect: Anxious;Depressed;Sad Anxiety Level: Minimal Thought Processes: Coherent;Relevant Judgement: Unimpaired Orientation: Person;Place;Time;Situation Obsessive Compulsive Thoughts/Behaviors: None  Cognitive Functioning Concentration: Decreased Memory: Recent Intact;Remote Intact IQ: Average Insight: Fair Impulse Control: Fair Appetite: Good Weight Loss: 0 Sleep: Decreased Total Hours of Sleep:  (Intermittent sleep pattens ) Vegetative Symptoms: None  ADLScreening [facility] Assessment Services) Patient's cognitive ability adequate to safely complete daily activities?: Yes Patient able to express need for assistance with ADLs?: Yes Independently performs ADLs?: Yes (appropriate for developmental age)  Abuse/Neglect [facility]) Physical Abuse: Denies Verbal Abuse: Denies Sexual Abuse: Denies  Prior Inpatient Therapy Prior Inpatient Therapy: No Prior Therapy Dates: None  Prior Therapy Facilty/Provider(s): None  Reason for Treatment: None   Prior Outpatient Therapy Prior Outpatient Therapy: No Prior Therapy Dates: None  Prior Therapy Facilty/Provider(s): None  Reason for Treatment: None   ADL Screening (condition at time of admission) Patient's cognitive ability adequate to safely complete daily activities?: Yes Patient able to express need for assistance with ADLs?: Yes Independently performs ADLs?: Yes (appropriate  for developmental age) Weakness of Legs: None Weakness of Arms/Hands: None  Home Assistive Devices/Equipment Home Assistive Devices/Equipment: None  Therapy Consults (therapy consults require a physician order) PT Evaluation Needed: No OT Evalulation Needed: No SLP Evaluation Needed:  No Abuse/Neglect Assessment (Assessment to be complete while patient is alone) Physical Abuse: Denies Verbal Abuse: Denies Sexual Abuse: Denies Exploitation of patient/patient's resources: Denies Self-Neglect: Denies Values / Beliefs Cultural Requests During Hospitalization: None Spiritual Requests During Hospitalization: None Consults Spiritual Care Consult Needed: No Social Work Consult Needed: No Merchant navy officer (For Healthcare) Advance Directive: Patient does not have advance directive;Patient would not like information Pre-existing out of facility DNR order (yellow form or pink MOST form): No Nutrition Screen- [facility] Patient's home diet: Regular Have you recently lost weight without trying?: No Have you been eating poorly because of a decreased appetite?: No Malnutrition Screening Tool Score: 0  Additional Information 1:1 In Past 12 Months?: No CIRT Risk: No Elopement Risk: No Does patient have medical clearance?: Yes     Disposition:  Disposition Initial Assessment Completed for this Encounter: Yes Disposition of Patient: Referred to;Outpatient treatment (IOP for SA/Psych ) Patient referred to: Outpatient clinic referral;Other (Comment) (Referral for SA/Psych IOP )  On Site Evaluation by:   Reviewed with Physician:     Murrell Redden 08/21/2012 4:01 AM

## 2012-08-21 NOTE — Consult Note (Signed)
S-Pt seen at request of [facility] for clearance to D/C in am and plan IOP for earlt stage alcoholism triggered by stressors     of job and family.Pt states he has been trying to deal with stress on own for past 1 1/2 yr and has only gotten worse.His drinking has progressed to blackout stage and his depression has worsened to the point where he felt yesterday that the thought of suicide no longr bothered him.He reports an extensive family hx of alcoholism.He has distant cousins who have committed suicide-?alcohol related. He does not feel he can work and be successfully treated but he does not require hospital at this point. He admits he has no intention of acting on his thinking. As was discussed with [facility] pt is willing to attend IOP for early alcoholism and what appears to be substance induced mood disorder.  O- He is alert/oriented x3/Appropriate affect/Speech and thought comprehensible/Insight is limited but he doesd deny his alcohol problem nor t my perception that he appears to have earlt stage ([place]) alcoholism as well as significant stressors that have triggered this.  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

## 2012-08-21 NOTE — ED Notes (Signed)
Pt's wife is here to pick him up. 

## 2012-08-21 NOTE — ED Provider Notes (Signed)
Pt stable awaiting placement  Benny Lennert, MD 08/21/12 [phone]

## 2012-08-21 NOTE — ED Notes (Signed)
Up to the desk to call wife for a ride home

## 2012-08-23 ENCOUNTER — Telehealth: Payer: Self-pay | Admitting: *Deleted

## 2012-08-23 ENCOUNTER — Ambulatory Visit (HOSPITAL_COMMUNITY)
Admission: RE | Admit: 2012-08-23 | Discharge: 2012-08-23 | Disposition: A | Discharge status: Home / Self Care | Payer: Managed Care, Other (non HMO) | Attending: Psychiatry | Admitting: Psychiatry

## 2012-08-23 DIAGNOSIS — F3289 Other specified depressive episodes: Secondary | ICD-10-CM | POA: Insufficient documentation

## 2012-08-23 DIAGNOSIS — F329 Major depressive disorder, single episode, unspecified: Secondary | ICD-10-CM | POA: Insufficient documentation

## 2012-08-23 DIAGNOSIS — F1320 Sedative, hypnotic or anxiolytic dependence, uncomplicated: Secondary | ICD-10-CM | POA: Insufficient documentation

## 2012-08-23 DIAGNOSIS — F1010 Alcohol abuse, uncomplicated: Secondary | ICD-10-CM | POA: Insufficient documentation

## 2012-08-23 NOTE — Telephone Encounter (Signed)
Pt wanted to know if NP would fill out FMLA paperwork for him for depression.

## 2012-08-23 NOTE — BH Assessment (Addendum)
Assessment Note   Trevor Mendez is an 32 y.o. male. Pt walked in for assessment stating he was seen in ER at [facility] last Thursday and referred to IOP. States he called [name] today made appointment for Wednesday at 10am for CD-IOP and needs assessment completed. Has been dealing with depression for 2 years but it has gotten worse in last 6 months. Scared of his thoughts of "not giving a fu--."  Has thought about suicide, last week had a loaded gun but states he couldn't go through with suicide. Has a positive support system and states he couldn't do it because of his wife and 74 month old daughter. Has had several male family members complete suicide in past such as paternal Grandfather and several great uncles. He has been abusing his prescribed Xanax. Using 0.5mg  up to 8 pills a day on his worst day. Age of first use 42 when it was prescribed to him Nov, 2013. Using alcohol in between xanax use of up to 12 pack a day. Not using on a daily basis but drinks up to 12 beers on days he doesn't use as much xanax. Last use of alcohol last wednesday had 10-12 beers and couple of shots. Age of first use 36. Has been using this way for past 4 months. States he never runs out of xanax as he uses alcohol in between to get him through. Has no withdrawal symptoms, no history of seizures or blackouts. Self medicates with Nyquil or benadryl to help him sleep. Contracts for safety and signed a no harm contract. States he wants to get his life together and eager to start CD-IOP. Did offer to call MD to discuss detox options but he declined wanting detox at this time stating he would like to start with outpatient and if it's needed later he would consider it. Given suicide prevention information and copy of no harm contract. MSE not required as appointment had been made prior to arrival.   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 MIGRAINE HEADACHE 04/10/2007    Qualifier: Diagnosis of  By: Genelle Gather CMA, [place]    . DEPRESSION, MAJOR, SEVERE 09/13/2009    Qualifier: Diagnosis of  By: Debby Bud MD, Rosalyn Gess   . HTN (hypertension) 10/09/2011   Axis IV: problems related to social environment Axis V: 51-60 moderate symptoms  Past Medical History:  Past Medical History  Diagnosis Date  . MIGRAINE HEADACHE 04/10/2007    Qualifier: Diagnosis of  By: Genelle Gather CMA, [place]    . DEPRESSION, MAJOR, SEVERE 09/13/2009    Qualifier: Diagnosis of  By: Debby Bud MD, Rosalyn Gess   . HTN (hypertension) 10/09/2011    Past Surgical History  Procedure Laterality Date  . Finger surgury      Family History:  Family History  Problem Relation Age of Onset  . Hypertension Mother   . Hypertension Father     Social History:  reports that he has been smoking.  He uses smokeless tobacco. He reports that  drinks alcohol. He reports that he does not use illicit drugs.  Additional Social History:     CIWA:   COWS:    Allergies:  Allergies  Allergen Reactions  . Codeine Nausea And Vomiting    Home Medications:  (Not in a hospital admission)  OB/GYN Status:  No LMP for male patient.  General Assessment  Data Location of Assessment: [facility] Services Living Arrangements: Spouse/significant other (wife and 81 month old daughter) Can pt return to current living arrangement?: Yes Referral Source: MD [name] Norrins)  Education Status Is patient currently in school?: No  Risk to self Suicidal Ideation: No-Not Currently/Within Last 6 Months Suicidal Intent: No-Not Currently/Within Last 6 Months Is patient at risk for suicide?: Yes Suicidal Plan?: No Access to Means: Yes Specify Access to Suicidal Means: guns in home What has been your use of drugs/alcohol within the last 12 months?: abusing xanax/etoh Previous Attempts/Gestures: No Intentional Self Injurious  Behavior: None Family Suicide History: Yes (PGF-suicide completion as well as several great uncles) Recent stressful life event(s): Financial Problems;Other (Comment) (job more demanding) Persecutory voices/beliefs?: No Depression: Yes Depression Symptoms: Insomnia;Isolating;Guilt;Loss of interest in usual pleasures;Feeling angry/irritable Substance abuse history and/or treatment for substance abuse?: Yes Suicide prevention information given to non-admitted patients: Yes  Risk to Others Homicidal Ideation: No Thoughts of Harm to Others: No Current Homicidal Intent: No Current Homicidal Plan: No Access to Homicidal Means: No History of harm to others?: No Assessment of Violence: None Noted Does patient have access to weapons?: Yes (Comment) (guns in home) Criminal Charges Pending?: No Does patient have a court date: No  Psychosis Hallucinations: None noted Delusions: None noted  Mental Status Report Appear/Hygiene: Other (Comment) (WNL) Eye Contact: Good Motor Activity: Unremarkable Speech: Logical/coherent Level of Consciousness: Alert Mood: Depressed Affect: Depressed Anxiety Level: Minimal Thought Processes: Coherent;Relevant Judgement: Unimpaired Orientation: Person;Place;Time;Situation Obsessive Compulsive Thoughts/Behaviors: None  Cognitive Functioning Concentration: Decreased Memory: Recent Intact;Remote Intact IQ: Average Insight: Fair Impulse Control: Good Appetite: Fair Weight Loss: 12 (12lb in 3 weeks) Sleep: Decreased Total Hours of Sleep: 3 Vegetative Symptoms: None  ADLScreening [facility] Assessment Services) Patient's cognitive ability adequate to safely complete daily activities?: Yes Patient able to express need for assistance with ADLs?: Yes Independently performs ADLs?: Yes (appropriate for developmental age)  Abuse/Neglect [facility]) Physical Abuse: Denies Verbal Abuse: Denies Sexual Abuse: Denies  Prior Inpatient Therapy Prior Inpatient Therapy:  No  Prior Outpatient Therapy Prior Outpatient Therapy: No  ADL Screening (condition at time of admission) Patient's cognitive ability adequate to safely complete daily activities?: Yes Patient able to express need for assistance with ADLs?: Yes Independently performs ADLs?: Yes (appropriate for developmental age) Weakness of Legs: None Weakness of Arms/Hands: None  Home Assistive Devices/Equipment Home Assistive Devices/Equipment: None    Abuse/Neglect Assessment (Assessment to be complete while patient is alone) Physical Abuse: Denies Verbal Abuse: Denies Sexual Abuse: Denies Exploitation of patient/patient's resources: Denies Self-Neglect: Denies Values / Beliefs Cultural Requests During Hospitalization: None Spiritual Requests During Hospitalization: None   Advance Directives (For Healthcare) Advance Directive: Patient does not have advance directive Pre-existing out of facility DNR order (yellow form or pink MOST form): No Nutrition Screen- [facility] Adult/[facility] Have you recently lost weight without trying?: Yes If yes, how much weight have you lost?: 2-13 lb Have you been eating poorly because of a decreased appetite?: Yes Malnutrition Screening Tool Score: 2  Additional Information 1:1 In Past 12 Months?: No CIRT Risk: No Elopement Risk: No Does patient have medical clearance?: No     Disposition:  Disposition Initial Assessment Completed for this Encounter: Yes Disposition of Patient: Outpatient treatment Type of outpatient treatment: Chemical Dependence - Intensive Outpatient Patient referred to: Outpatient clinic referral  On Site Evaluation by:   Reviewed with Physician:     Leafy Kindle 08/23/2012 3:34 PM

## 2012-08-23 NOTE — Telephone Encounter (Signed)
This should be done by his PCP Dr.  Debby Bud. If he will not do it, then I will. Rene Kocher

## 2012-08-23 NOTE — Telephone Encounter (Signed)
Pt left message on triage stating that he needed to touch base with NP again-OV on Friday. Left message for pt to callback with more information/clarification or to make OV with NP today if he wishes.

## 2012-08-25 ENCOUNTER — Encounter (HOSPITAL_COMMUNITY): Payer: Self-pay | Admitting: Emergency Medicine

## 2012-08-25 ENCOUNTER — Inpatient Hospital Stay (HOSPITAL_COMMUNITY)
Admission: RE | Admit: 2012-08-25 | Discharge: 2012-08-28 | DRG: 897 | Disposition: A | Discharge status: Home / Self Care | Payer: Managed Care, Other (non HMO) | Attending: Psychiatry | Admitting: Psychiatry

## 2012-08-25 ENCOUNTER — Emergency Department (HOSPITAL_COMMUNITY)
Admission: EM | Admit: 2012-08-25 | Discharge: 2012-08-25 | Disposition: A | Discharge status: Other Acute Inpatient Hospital | Payer: Managed Care, Other (non HMO) | Attending: Emergency Medicine | Admitting: Emergency Medicine

## 2012-08-25 ENCOUNTER — Encounter (HOSPITAL_COMMUNITY): Payer: Self-pay | Admitting: Behavioral Health

## 2012-08-25 ENCOUNTER — Encounter (HOSPITAL_COMMUNITY): Payer: Self-pay | Admitting: *Deleted

## 2012-08-25 DIAGNOSIS — R197 Diarrhea, unspecified: Secondary | ICD-10-CM

## 2012-08-25 DIAGNOSIS — I10 Essential (primary) hypertension: Secondary | ICD-10-CM | POA: Insufficient documentation

## 2012-08-25 DIAGNOSIS — R112 Nausea with vomiting, unspecified: Secondary | ICD-10-CM

## 2012-08-25 DIAGNOSIS — F1310 Sedative, hypnotic or anxiolytic abuse, uncomplicated: Secondary | ICD-10-CM | POA: Insufficient documentation

## 2012-08-25 DIAGNOSIS — Z79899 Other long term (current) drug therapy: Secondary | ICD-10-CM | POA: Insufficient documentation

## 2012-08-25 DIAGNOSIS — G43909 Migraine, unspecified, not intractable, without status migrainosus: Secondary | ICD-10-CM

## 2012-08-25 DIAGNOSIS — F322 Major depressive disorder, single episode, severe without psychotic features: Secondary | ICD-10-CM

## 2012-08-25 DIAGNOSIS — F332 Major depressive disorder, recurrent severe without psychotic features: Secondary | ICD-10-CM | POA: Diagnosis present

## 2012-08-25 DIAGNOSIS — F1010 Alcohol abuse, uncomplicated: Principal | ICD-10-CM | POA: Diagnosis present

## 2012-08-25 DIAGNOSIS — F411 Generalized anxiety disorder: Secondary | ICD-10-CM | POA: Diagnosis present

## 2012-08-25 DIAGNOSIS — F17200 Nicotine dependence, unspecified, uncomplicated: Secondary | ICD-10-CM | POA: Insufficient documentation

## 2012-08-25 DIAGNOSIS — S61209A Unspecified open wound of unspecified finger without damage to nail, initial encounter: Secondary | ICD-10-CM

## 2012-08-25 LAB — COMPREHENSIVE METABOLIC PANEL
ALT: 28 U/L (ref 0–53)
Albumin: 3.9 g/dL (ref 3.5–5.2)
Alkaline Phosphatase: 63 U/L (ref 39–117)
BUN: 11 mg/dL (ref 6–23)
Calcium: 9.4 mg/dL (ref 8.4–10.5)
Potassium: 3.3 mEq/L — ABNORMAL LOW (ref 3.5–5.1)
Sodium: 138 mEq/L (ref 135–145)
Total Protein: 7 g/dL (ref 6.0–8.3)

## 2012-08-25 LAB — RAPID URINE DRUG SCREEN, HOSP PERFORMED
Amphetamines: NOT DETECTED
Benzodiazepines: POSITIVE — AB
Cocaine: NOT DETECTED

## 2012-08-25 LAB — ETHANOL: Alcohol, Ethyl (B): <11 mg/dL (ref 0–11)

## 2012-08-25 LAB — CBC
MCHC: 33.4 g/dL (ref 30.0–36.0)
RDW: 15.9 % — ABNORMAL HIGH (ref 11.5–15.5)

## 2012-08-25 MED ORDER — CHLORDIAZEPOXIDE HCL 25 MG PO CAPS
25.0000 mg | ORAL_CAPSULE | ORAL | Status: AC
  Administered 2012-08-27 (×2): 25 mg via ORAL
  Filled 2012-08-25 (×2): qty 1

## 2012-08-25 MED ORDER — AMLODIPINE BESYLATE 5 MG PO TABS
5.0000 mg | ORAL_TABLET | Freq: Every day | ORAL | Status: DC
  Administered 2012-08-25 – 2012-08-28 (×4): 5 mg via ORAL
  Filled 2012-08-25 (×7): qty 1

## 2012-08-25 MED ORDER — CHLORDIAZEPOXIDE HCL 25 MG PO CAPS
25.0000 mg | ORAL_CAPSULE | Freq: Every day | ORAL | Status: AC
  Administered 2012-08-28: 25 mg via ORAL
  Filled 2012-08-25: qty 1

## 2012-08-25 MED ORDER — CHLORDIAZEPOXIDE HCL 25 MG PO CAPS: 25.0000 mg | ORAL_CAPSULE | Freq: Four times a day (QID) | ORAL | Status: AC | PRN

## 2012-08-25 MED ORDER — ONDANSETRON 4 MG PO TBDP: 4.0000 mg | ORAL_TABLET | Freq: Four times a day (QID) | ORAL | Status: AC | PRN

## 2012-08-25 MED ORDER — FLUOXETINE HCL 20 MG PO CAPS
20.0000 mg | ORAL_CAPSULE | Freq: Two times a day (BID) | ORAL | Status: DC
  Administered 2012-08-25 – 2012-08-28 (×6): 20 mg via ORAL
  Filled 2012-08-25 (×11): qty 1

## 2012-08-25 MED ORDER — ALUM & MAG HYDROXIDE-SIMETH 200-200-20 MG/5ML PO SUSP: 30.0000 mL | ORAL | Status: DC | PRN

## 2012-08-25 MED ORDER — TRAZODONE HCL 50 MG PO TABS: 50.0000 mg | ORAL_TABLET | Freq: Once | ORAL | Status: DC

## 2012-08-25 MED ORDER — MAGNESIUM HYDROXIDE 400 MG/5ML PO SUSP: 30.0000 mL | Freq: Every day | ORAL | Status: DC | PRN

## 2012-08-25 MED ORDER — CHLORDIAZEPOXIDE HCL 25 MG PO CAPS
50.0000 mg | ORAL_CAPSULE | Freq: Once | ORAL | Status: AC
  Administered 2012-08-25: 50 mg via ORAL
  Filled 2012-08-25: qty 2

## 2012-08-25 MED ORDER — ADULT MULTIVITAMIN W/MINERALS CH
1.0000 | ORAL_TABLET | Freq: Every day | ORAL | Status: DC
  Administered 2012-08-25 – 2012-08-28 (×4): 1 via ORAL
  Filled 2012-08-25 (×6): qty 1

## 2012-08-25 MED ORDER — THIAMINE HCL 100 MG/ML IJ SOLN
100.0000 mg | Freq: Once | INTRAMUSCULAR | Status: AC
  Administered 2012-08-25: 100 mg via INTRAMUSCULAR

## 2012-08-25 MED ORDER — LOPERAMIDE HCL 2 MG PO CAPS: 2.0000 mg | ORAL_CAPSULE | ORAL | Status: AC | PRN

## 2012-08-25 MED ORDER — TRAZODONE HCL 50 MG PO TABS
50.0000 mg | ORAL_TABLET | Freq: Every evening | ORAL | Status: DC | PRN
  Administered 2012-08-25: 50 mg via ORAL
  Filled 2012-08-25: qty 1

## 2012-08-25 MED ORDER — ACETAMINOPHEN 325 MG PO TABS: 650.0000 mg | ORAL_TABLET | Freq: Four times a day (QID) | ORAL | Status: DC | PRN

## 2012-08-25 MED ORDER — NICOTINE 21 MG/24HR TD PT24
21.0000 mg | MEDICATED_PATCH | Freq: Every day | TRANSDERMAL | Status: DC
  Administered 2012-08-26 – 2012-08-27 (×2): 21 mg via TRANSDERMAL
  Filled 2012-08-25 (×5): qty 1

## 2012-08-25 MED ORDER — HYDROXYZINE HCL 25 MG PO TABS: 25.0000 mg | ORAL_TABLET | Freq: Four times a day (QID) | ORAL | Status: AC | PRN

## 2012-08-25 MED ORDER — CHLORDIAZEPOXIDE HCL 25 MG PO CAPS
25.0000 mg | ORAL_CAPSULE | Freq: Three times a day (TID) | ORAL | Status: AC
  Administered 2012-08-26 (×3): 25 mg via ORAL
  Filled 2012-08-25 (×3): qty 1

## 2012-08-25 MED ORDER — TRAZODONE HCL 50 MG PO TABS
50.0000 mg | ORAL_TABLET | Freq: Once | ORAL | Status: AC
  Administered 2012-08-25: 50 mg via ORAL
  Filled 2012-08-25 (×2): qty 1

## 2012-08-25 MED ORDER — TRAZODONE HCL 50 MG PO TABS
50.0000 mg | ORAL_TABLET | Freq: Every evening | ORAL | Status: DC | PRN
  Administered 2012-08-26 – 2012-08-27 (×4): 50 mg via ORAL
  Filled 2012-08-25 (×8): qty 1

## 2012-08-25 MED ORDER — POTASSIUM CHLORIDE CRYS ER 20 MEQ PO TBCR
40.0000 meq | EXTENDED_RELEASE_TABLET | Freq: Once | ORAL | Status: AC
  Administered 2012-08-25: 40 meq via ORAL
  Filled 2012-08-25: qty 2

## 2012-08-25 MED ORDER — CHLORDIAZEPOXIDE HCL 25 MG PO CAPS
25.0000 mg | ORAL_CAPSULE | Freq: Four times a day (QID) | ORAL | Status: AC
  Administered 2012-08-25 (×2): 25 mg via ORAL
  Filled 2012-08-25 (×2): qty 1

## 2012-08-25 MED ORDER — VITAMIN B-1 100 MG PO TABS
100.0000 mg | ORAL_TABLET | Freq: Every day | ORAL | Status: DC
  Administered 2012-08-26 – 2012-08-28 (×3): 100 mg via ORAL
  Filled 2012-08-25 (×5): qty 1

## 2012-08-25 MED ORDER — HYDROCHLOROTHIAZIDE 12.5 MG PO CAPS
12.5000 mg | ORAL_CAPSULE | Freq: Every day | ORAL | Status: DC
  Administered 2012-08-25 – 2012-08-28 (×4): 12.5 mg via ORAL
  Filled 2012-08-25 (×6): qty 1

## 2012-08-25 NOTE — ED Provider Notes (Signed)
Patient requesting detox from Xanax. Last took Xanax 1 hour ago. Presently feels shaky patient is alert appears comfortable Glasgow Coma Score 15  Doug Sou, MD 08/25/12 1201

## 2012-08-25 NOTE — BH Assessment (Signed)
Assessment Note   Trevor Mendez is an 32 y.o. male.Referred to the assessment office from the outpatient department to be admitted for detox. He was accompanied by his wife. He states he had received a phone call this am from the outpatient dept to come in to be admitted at 10 am today. He is prepared and has brought his luggage. He is using 1.5mg  -4 mg of Xanax a day and has been using at this level since mid Nov 2013. He also has been drinking heavily 8-12 beers qd for years. He had his last drink seven days ago and took 1.5 mg of Xanax 20 min before arrival here.He denies suicidal ideation and is not homicidal or psychotic.Marland Kitchen He is willing and ready for detox and after detox plans to return to Behavioral health for CD IOP. Consulted with Fransisca Kaufmann NP and she requested that he first go to [facility] for medical clearance and than Dr. Lucianne Muss has accepted him for admission to Dr. Runell Gess service.He was escorted to [facility] by security and a Pensions consultant along with his wife. 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 MIGRAINE HEADACHE 04/10/2007    Qualifier: Diagnosis of  By: Genelle Gather CMA, [place]    . DEPRESSION, MAJOR, SEVERE 09/13/2009    Qualifier: Diagnosis of  By: Debby Bud MD, Rosalyn Gess   . HTN (hypertension) 10/09/2011   Axis IV: economic problems, occupational problems and other psychosocial or environmental problems Axis V: 21-30 behavior considerably influenced by delusions or hallucinations OR serious impairment in judgment, communication OR inability to function in almost all areas  Past Medical History:  Past Medical History  Diagnosis Date  . MIGRAINE HEADACHE 04/10/2007    Qualifier: Diagnosis of  By: Genelle Gather CMA, [place]    . DEPRESSION, MAJOR, SEVERE 09/13/2009    Qualifier: Diagnosis of  By: Debby Bud MD, Rosalyn Gess   . HTN  (hypertension) 10/09/2011    Past Surgical History  Procedure Laterality Date  . Finger surgury      Family History:  Family History  Problem Relation Age of Onset  . Hypertension Mother   . Hypertension Father     Social History:  reports that he has been smoking.  He uses smokeless tobacco. He reports that  drinks alcohol. He reports that he uses illicit drugs (Benzodiazepines).  Additional Social History:  Alcohol / Drug Use Pain Medications: not abusing Prescriptions: Xanax, using 1.5 mg to 8 mg daily since Nov 2013 History of alcohol / drug use?: Yes Negative Consequences of Use: Personal relationships Substance #1 Name of Substance 1: Xanax 1 - Age of First Use: 31 1 - Amount (size/oz): 1.5-8mg  daily 1 - Frequency: daily 1 - Duration: 7 months 1 - Last Use / Amount: this am Substance #2 Name of Substance 2: alcohol 2 - Age of First Use: unknown 2 - Amount (size/oz): 8-12 beers  2 - Frequency: daily 2 - Duration: years 2 - Last Use / Amount: 7 days ago  CIWA: CIWA-Ar Nausea and Vomiting: no nausea and no vomiting Tactile Disturbances: none Tremor: no tremor Auditory Disturbances: not present Paroxysmal Sweats: no sweat visible Visual Disturbances: not present Anxiety: two Headache, Fullness in Head: moderate Agitation: normal activity Orientation and Clouding of Sensorium: oriented and can do serial additions CIWA-Ar Total: 5 COWS:    Allergies:  Allergies  Allergen Reactions  .  Codeine Nausea And Vomiting    Home Medications:  (Not in a hospital admission)  OB/GYN Status:  No LMP for male patient.  General Assessment Data Location of Assessment: [facility] Living Arrangements: Spouse/significant other Can pt return to current living arrangement?: Yes Admission Status: Voluntary Is patient capable of signing voluntary admission?: Yes Transfer from: Home Referral Source:  (outpatiet services sent up for admission to  detox)  Education Status Is patient currently in school?: No  Risk to self Suicidal Ideation: No Suicidal Intent: No Is patient at risk for suicide?: No Suicidal Plan?: No Access to Means: No Specify Access to Suicidal Means: guns at home What has been your use of drugs/alcohol within the last 12 months?: daily use of benzodiazapines and recent ETOH use Previous Attempts/Gestures: No How many times?: 0 Other Self Harm Risks: 0 Triggers for Past Attempts: None known Intentional Self Injurious Behavior: None Family Suicide History: Yes Recent stressful life event(s): Financial Problems Persecutory voices/beliefs?: No Depression: Yes Depression Symptoms: Guilt;Loss of interest in usual pleasures;Feeling worthless/self pity Substance abuse history and/or treatment for substance abuse?: Yes Suicide prevention information given to non-admitted patients: Not applicable  Risk to Others Homicidal Ideation: No Thoughts of Harm to Others: No Current Homicidal Intent: No Current Homicidal Plan: No Access to Homicidal Means: No History of harm to others?: No Assessment of Violence: None Noted Does patient have access to weapons?: Yes (Comment) Criminal Charges Pending?: No Does patient have a court date: No  Psychosis Hallucinations: None noted Delusions: None noted  Mental Status Report Appear/Hygiene:  (unremarkable) Eye Contact: Good Motor Activity: Freedom of movement Speech: Logical/coherent Level of Consciousness: Alert Mood: Depressed;Anxious Affect: Appropriate to circumstance Anxiety Level: Moderate Thought Processes: Coherent;Relevant Judgement: Unimpaired Orientation: Person;Place;Time;Situation Obsessive Compulsive Thoughts/Behaviors: None  Cognitive Functioning Concentration: Decreased Memory: Recent Intact;Remote Intact IQ: Average Insight: Fair Impulse Control: Fair Appetite: Good Weight Loss: 0 Weight Gain: 0 Sleep: Decreased Total Hours of Sleep:   (not assessed) Vegetative Symptoms: None  ADLScreening [facility] Assessment Services) Patient's cognitive ability adequate to safely complete daily activities?: Yes Patient able to express need for assistance with ADLs?: Yes Independently performs ADLs?: Yes (appropriate for developmental age)  Abuse/Neglect [facility]) Physical Abuse: Denies (not assessed) Verbal Abuse: Denies (not assessed) Sexual Abuse:  (not assessed)  Prior Inpatient Therapy Prior Inpatient Therapy: No  Prior Outpatient Therapy Prior Outpatient Therapy: No  ADL Screening (condition at time of admission) Patient's cognitive ability adequate to safely complete daily activities?: Yes Patient able to express need for assistance with ADLs?: Yes Independently performs ADLs?: Yes (appropriate for developmental age) Weakness of Legs: None Weakness of Arms/Hands: None  Home Assistive Devices/Equipment Home Assistive Devices/Equipment: None    Abuse/Neglect Assessment (Assessment to be complete while patient is alone) Physical Abuse: Denies (not assessed) Verbal Abuse: Denies (not assessed) Sexual Abuse:  (not assessed) Exploitation of patient/patient's resources: Denies Self-Neglect: Denies Values / Beliefs Cultural Requests During Hospitalization: None Spiritual Requests During Hospitalization: None   Advance Directives (For Healthcare) Advance Directive: Patient does not have advance directive Pre-existing out of facility DNR order (yellow form or pink MOST form): No Nutrition Screen- [facility] Adult/[facility] Patient's home diet: Regular Have you recently lost weight without trying?: No If yes, how much weight have you lost?: 2-13 lb Have you been eating poorly because of a decreased appetite?: Yes Malnutrition Screening Tool Score: 2  Additional Information 1:1 In Past 12 Months?: No CIRT Risk: No Elopement Risk: No Does patient have medical clearance?:  (being sent  for medical clearance to [facility])     Disposition:   Disposition Initial Assessment Completed for this Encounter: Yes Disposition of Patient: Inpatient treatment program (after being medically cleared) Type of inpatient treatment program: Adult Type of outpatient treatment: Adult Patient referred to: Outpatient clinic referral  On Site Evaluation by:   Reviewed with Physician:     Wynona Luna 08/25/2012 11:22 AM

## 2012-08-25 NOTE — ED Notes (Signed)
Pt is from [facility].  Has a bed at [facility] after we med clear him.  Abusing xanax and ETOH.  Last used xanax today 1.5 mg.  Has not drank alcohol for 7 days.  Has been drinking heavily for 8-14 hours a day.

## 2012-08-25 NOTE — ED Provider Notes (Signed)
History     CSN: [id number]  Arrival date & time 08/25/12  1029   First MD Initiated Contact with Patient 08/25/12 1033      Chief Complaint  Patient presents with  . Medical Clearance    (Consider location/radiation/quality/duration/timing/severity/associated sxs/prior treatment) HPI  32 year old male with history of suicidal ideation and history of alcohol abuse who was sent from [facility] for medical clearance. Patient has been abusing Xanax for the past 7 months due to history of panic attack. States he has been addicted to Xanax, last use was today. He also drink alcohol and smoke cigarettes. However last alcohol intake was 7 days ago. He is scheduled to start a intensive outpatient care for his addiction, in which he is willing. He is here for medical clearance. He did had prior history of suicidal ideation but no active suicidal ideation this time. Denies homicidal ideation or hallucination. Denies any other significant problem. Does have history of hypertension and is currently taking amlodipine, and hydrochlorothiazide. Currently denies fever, headache, chest pain, shortness of breath, abdominal pain, nausea, vomiting, diarrhea, or rash. Denies any numbness or weakness.  Past Medical History  Diagnosis Date  . MIGRAINE HEADACHE 04/10/2007    Qualifier: Diagnosis of  By: Genelle Gather CMA, [place]    . DEPRESSION, MAJOR, SEVERE 09/13/2009    Qualifier: Diagnosis of  By: Debby Bud MD, Rosalyn Gess   . HTN (hypertension) 10/09/2011    Past Surgical History  Procedure Laterality Date  . Finger surgury      Family History  Problem Relation Age of Onset  . Hypertension Mother   . Hypertension Father     History  Substance Use Topics  . Smoking status: Current Every Day Smoker  . Smokeless tobacco: Current User  . Alcohol Use: Yes     Comment: everyday      Review of Systems  All other systems reviewed and are negative.    Allergies  Codeine  Home Medications   Current  Outpatient Rx  Name  Route  Sig  Dispense  Refill  . ALPRAZolam (XANAX) 0.5 MG tablet   Oral   Take 1 tablet (0.5 mg total) by mouth 3 (three) times daily as needed for anxiety.   90 tablet   1   . amLODipine (NORVASC) 5 MG tablet   Oral   Take 5 mg by mouth daily.         . Aspirin-Acetaminophen-Caffeine (GOODY HEADACHE PO)   Oral   Take 1 packet by mouth daily.         Marland Kitchen FLUoxetine (PROZAC) 20 MG capsule      TAKE ONE CAPSULE BY MOUTH TWICE A DAY   60 capsule   3   . hydrochlorothiazide (MICROZIDE) 12.5 MG capsule   Oral   Take 12.5 mg by mouth daily.           There were no vitals taken for this visit.  Physical Exam  Nursing note and vitals reviewed. Constitutional: He is oriented to person, place, and time. He appears well-developed and well-nourished. No distress.  Awake, alert, nontoxic appearance  HENT:  Head: Atraumatic.  Mouth/Throat: Oropharynx is clear and moist.  Eyes: Conjunctivae and EOM are normal. Pupils are equal, round, and reactive to light. Right eye exhibits no discharge. Left eye exhibits no discharge.  Pupils 4mm and reactive bilaterally.  Neck: Normal range of motion. Neck supple.  Cardiovascular: Normal rate and regular rhythm.   Pulmonary/Chest: Effort normal. No respiratory distress. He exhibits no  tenderness.  Abdominal: Soft. There is no tenderness. There is no rebound.  Musculoskeletal: He exhibits no tenderness.  ROM appears intact, no obvious focal weakness  Neurological: He is alert and oriented to person, place, and time. Coordination and gait normal. GCS eye subscore is 4. GCS verbal subscore is 5. GCS motor subscore is 6.  Skin: Skin is warm and dry. No rash noted.  Psychiatric: He has a normal mood and affect. His speech is normal and behavior is normal. Judgment and thought content normal. Thought content is not paranoid. Cognition and memory are normal. He expresses no homicidal and no suicidal ideation.    ED Course   Procedures (including critical care time)  10:46 AM Pt is here for medical clearance.  He is currently being managed by [facility] and will undergo intensive outpatient treatment for his Benzo's addiction.    12:01 PM Pt is medically cleared.  K+ 3.3, will give supplementation.  Pt to return to [facility] for further care.  My attending has evaluated pt and agrees with plan.    Labs Reviewed  CBC - Abnormal; Notable for the following:    RDW 15.9 (*)    All other components within normal limits  COMPREHENSIVE METABOLIC PANEL - Abnormal; Notable for the following:    Potassium 3.3 (*)    All other components within normal limits  URINE RAPID DRUG SCREEN (HOSP PERFORMED) - Abnormal; Notable for the following:    Benzodiazepines POSITIVE (*)    All other components within normal limits  ETHANOL   No results found.   1. Benzodiazepine abuse       MDM  BP 116/78  Pulse 60  Temp(Src) 98 F (36.7 C) (Oral)  Resp 16  SpO2 99%  I have reviewed nursing notes and vital signs.  I reviewed available ER/hospitalization records thought the EMR         Fayrene Helper, [place] 08/25/12 1204

## 2012-08-25 NOTE — Progress Notes (Signed)
Admission note: Pt seeking help to detox off of xanax and alcohol. Pt reports abusing xanax and self medicating with alcohol. He's been drinking heavily daily to substitute Xanax in order not to run out of xanax and raise a red flag at the pharmacy. 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 Pt became overwhelmed.  Pt feels that everything is falling apart and nothing seems to be going his way. Pt is requesting to be off meds and would like to develop tools to help him cope with his stressors. Per pt, that's something he never learned to do on his own.  Pt currently denies SI/HI/AVH.   Pt assessed, searched and explained the policy here at [facility]. Pt wife Carollee Herter took home pt belongings. Pt have 7 Xanax pills in his locker.

## 2012-08-25 NOTE — ED Provider Notes (Signed)
Medical screening examination/treatment/procedure(s) were conducted as a shared visit with non-physician practitioner(s) and myself.  I personally evaluated the patient during the encounter  Doug Sou, MD 08/25/12 1512

## 2012-08-25 NOTE — Tx Team (Signed)
Initial Interdisciplinary Treatment Plan  PATIENT STRENGTHS: (choose at least two) Ability for insight Active sense of humor Capable of independent living  PATIENT STRESSORS: Financial difficulties Substance abuse   PROBLEM LIST: Problem List/Patient Goals Date to be addressed Date deferred Reason deferred Estimated date of resolution  Substance abuse 08/25/12     SI 08/25/12     Depression 08/25/12                                          DISCHARGE CRITERIA:  Ability to meet basic life and health needs Adequate post-discharge living arrangements Improved stabilization in mood, thinking, and/or behavior Verbal commitment to aftercare and medication compliance Withdrawal symptoms are absent or subacute and managed without 24-hour nursing intervention  PRELIMINARY DISCHARGE PLAN: Attend aftercare/continuing care group Attend PHP/IOP  PATIENT/FAMIILY INVOLVEMENT: This treatment plan has been presented to and reviewed with the patient, Fintan Grater, and/or family member.  The patient and family have been given the opportunity to ask questions and make suggestions.  Brentley Landfair L 08/25/2012, 2:17 PM

## 2012-08-26 NOTE — Progress Notes (Signed)
D. Pt has been up and has been active while in milieu and has been participating this afternoon. Pt spoke about coming in and wanting to detox off alcohol and benzos. Pt spoke about not being able to quit the xanax and realized that he needed to come in and get help. Pt reported having some difficulties sleeping and feels too contained in this environment but realizes he needs to be here to be able to get clean and to help himself. Pt did endorse some depression and hopelessness today but has denied any SI A. Support and encouragement provided, medication education complete. R. Pt verbalized understanding, will continue to monitor.

## 2012-08-26 NOTE — [facility] Counselor (Signed)
Adult Comprehensive Assessment  Patient ID: Trevor Mendez, male   DOB: [birth year], 32 y.o.   MRN: [id number]  Information Source: Information source: Patient  Current Stressors:  Educational / Learning stressors: N/A Employment / Job issues: N/A Family Relationships: N/A Surveyor, quantity / Lack of resources (include bankruptcy): N/A Housing / Lack of housing: N/A Physical health (include injuries & life threatening diseases): N/A Social relationships: N/A Substance abuse: Benzo dependence Bereavement / Loss: pt states that he's been to 8 funerals in the last 1.5 years but doesn't report it being significant.  Living/Environment/Situation:  Living Arrangements: Spouse/significant other Living conditions (as described by patient or guardian): Pt states that he lives with his wife and daughter in [place].  Pt states that it is a safe, comfortable and stable home.   How long has patient lived in current situation?: 7 years What is atmosphere in current home: Supportive;Loving;Comfortable  Family History:  Marital status: Married Number of Years Married: 5 What types of issues is patient dealing with in the relationship?: Pt states that his wife is very supportive.  No issues reported besides pt's drug use.   Additional relationship information: N/A Does patient have children?: Yes How many children?: 1 How is patient's relationship with their children?: Pt has an 49 month daughter and has a good relationship with her.    Childhood History:  By whom was/is the patient raised?: Both parents Additional childhood history information: Pt states that he had a good, normal childhood.  Description of patient's relationship with caregiver when they were a child: Pt states that he got along well with parents growing up.  Patient's description of current relationship with people who raised him/her: Pt states that he still has a great relationship with parents and reports that they are very  supportive.   Does patient have siblings?: Yes Number of Siblings: 1 Description of patient's current relationship with siblings: Pt states that he has a great relationship with sister, states that she is honest and supportive.   Did patient suffer any verbal/emotional/physical/sexual abuse as a child?: No Did patient suffer from severe childhood neglect?: No Has patient ever been sexually abused/assaulted/raped as an adolescent or adult?: No Was the patient ever a victim of a crime or a disaster?: No Witnessed domestic violence?: No Has patient been effected by domestic violence as an adult?: No  Education:  Highest grade of school patient has completed: some college Currently a Consulting civil engineer?: No Name of school: N/A Learning disability?: No  Employment/Work Situation:   Employment situation: Employed Where is patient currently employed?: [employer] long has patient been employed?: 4 years Patient's job has been impacted by current illness: No What is the longest time patient has a held a job?: 4 years Where was the patient employed at that time?: Current job above Has patient ever been in the [employer]?: No Has patient ever served in Buyer, retail?: No  Financial Resources:   Financial resources: Income from [employer];Income from spouse Does patient have a representative payee or guardian?: No  Alcohol/Substance Abuse:   What has been your use of drugs/alcohol within the last 12 months?: Xanax - 3-10 pills daily, .5 mg, Alcohol - 12-18 beers daily when trying to get off of xanax on his own (last drink 7 days ago) If attempted suicide, did drugs/alcohol play a role in this?: No Alcohol/Substance Abuse Treatment Hx: Denies past history If yes, describe treatment: N/A Has alcohol/substance abuse ever caused legal problems?: No  Social Support System:   Patient's  Community Support System: Good Describe Community Support System: Pt states that his wife and family are  very supportive of him getting clean.  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   In what areas does patient struggle / problems for patient: Substance use  Discharge Plan:   Does patient have access to transportation?: Yes Will patient be returning to same living situation after discharge?: Yes Currently receiving community mental health services: No If no, would patient like referral for services when discharged?: Yes (What county?) (pt wants to step down to [facility]) Does patient have financial barriers related to discharge medications?: No  Summary/Recommendations:     Patient is a 32 year old Caucasian Male with a diagnosis of benzodiazapine dependence and alcohol dependence.  Patient lives in [place], [place] with his family.  Patient will benefit from crisis stabilization, medication evaluation, group therapy and psycho education in addition to case management for discharge planning.    [name], [name]. 08/26/2012

## 2012-08-26 NOTE — [facility] Group Notes (Signed)
[facility] LCSW Group Therapy  08/26/2012  1:15 PM   Type of Therapy:  Group Therapy  Participation Level:  Active  Participation Quality:  Appropriate and Attentive  Affect:  Appropriate  Cognitive:  Alert and Appropriate  Insight:  Developing/Improving and Engaged  Engagement in Therapy:  Developing/Improving and Engaged  Modes of Intervention:  Activity, Clarification, Confrontation, Discussion, Education, Exploration, Limit-setting, Orientation, Problem-solving, Rapport Building, Dance movement psychotherapist, Socialization and Support  Summary of Progress/Problems: The topic for group was balance in life.  Pt participated in the discussion about when their life was in balance and out of balance and how this feels.  Pt discussed ways to get back in balance and short term goals they can work on to get where they want to be.  Pt shared that a balanced life for him is having his time managed appropriately so he doesn't have time to think about using.  Pt shared that unbalance life is depression and being down, which he admits he has been dealing with depression.  Pt was able to process how he feels managing his time helps prevent using and was able to identify idle time as a trigger.    Trevor Mendez, LCSWA 08/26/2012 2:28 PM

## 2012-08-26 NOTE — Progress Notes (Signed)
Patient ID: Trevor Mendez, male   DOB: [birth year], 32 y.o.   MRN: [id number]  D: Took over patient's care @ 2330. Patient in bed sleeping. Respiration regular and unlabored. No sign of distress noted at this time A: 15 mins checks for safety. R: Patient is asleep and safe.

## 2012-08-26 NOTE — [facility] Group Notes (Signed)
[facility] LCSW Aftercare Discharge Planning Group Note   08/26/2012 8:45 AM  Participation Quality:  Alert and Appropriate   Mood/Affect:  Appropriate  Depression Rating:  2-3  Anxiety Rating:  2-3  Thoughts of Suicide:  Pt denies SI/HI  Will you contract for safety?   Yes  Current AVH:  No  Plan for Discharge/Comments:  Pt attended discharge planning group and actively participated in group.  CSW provided pt with today's workbook.  Pt states that he came to the hospital for detox from xanax.  Pt states that he wants to go to [facility] after he completes detox.  Pt states that he lives in [place], [place] with his wife and child and can return there.  Pt states that he is working but plans to take FMLA to focus on [facility].  CSW made the referral to [facility] today.  They will come up to assess pt while here.  No further needs voiced by pt at this time.    Transportation Means: Pt has access to transportation  Supports: Pt states that his family and wife are very supportive  Reyes Ivan, LCSWA 08/26/2012 10:04 AM

## 2012-08-26 NOTE — [facility] Suicide Risk Assessment (Signed)
[facility] INPATIENT: Family/Significant Other Suicide Prevention Education  Suicide Prevention Education:  Education Completed; No one has been identified by the patient as the family member/significant other with whom the patient will be residing, and identified as the person(s) who will aid the patient in the event of a mental health crisis (suicidal ideations/suicide attempt). With written consent from the patient, the family member/significant other has been provided the following suicide prevention education, prior to the and/or following the discharge of the patient.  The suicide prevention education provided includes the following:  Suicide risk factors  Suicide prevention and interventions  National Suicide Hotline telephone number  [facility] assessment telephone number  [facility] Emergency Assistance 911  [facility] and/or Residential Mobile Crisis Unit telephone number Request made of family/significant other to:  Remove weapons (e.g., guns, rifles, knives), all items previously/currently identified as safety concern.  Remove drugs/medications (over-the-counter, prescriptions, illicit drugs), all items previously/currently identified as a safety concern. The family member/significant other verbalizes understanding of the suicide prevention education information provided. The family member/significant other agrees to remove the items of safety concern listed above. Pt did not c/o SI at admission, nor have they endorsed SI during their stay here. SPE not required.  Reyes Ivan, [place] 08/26/2012  9:38 AM

## 2012-08-26 NOTE — [facility] Suicide Risk Assessment (Signed)
Suicide Risk Assessment  Admission Assessment     Nursing information obtained from:  Patient Demographic factors:  Male;Caucasian Current Mental Status:  Self-harm thoughts Loss Factors:  Financial problems / change in socioeconomic status Historical Factors:  Prior suicide attempts Risk Reduction Factors:  Responsible for children under 31 years of age;Religious beliefs about death;Employed;Positive social support  CLINICAL FACTORS:   Depression:   Anhedonia Hopelessness Insomnia Recent sense of peace/wellbeing Severe Alcohol/Substance Abuse/Dependencies  COGNITIVE FEATURES THAT CONTRIBUTE TO RISK:  Closed-mindedness Polarized thinking    SUICIDE RISK:   Minimal: No identifiable suicidal ideation.  Patients presenting with no risk factors but with morbid ruminations; may be classified as minimal risk based on the severity of the depressive symptoms  PLAN OF CARE: Admitted voluntarily for alcohol and benzo abuse vs dependence and detox treatment. He has interest in participating in IOP after discharge. He has no safety issues.  I certify that inpatient services furnished can reasonably be expected to improve the patient's condition.  Trevor Mendez,JANARDHAHA R. 08/26/2012, 4:12 PM

## 2012-08-26 NOTE — Progress Notes (Signed)
Recreation Therapy Notes  Date: 06.05.2014 Time: 3:00pm Location: [address] Dayroom      Group Topic/Focus: Leisure/Lifestyle Changes  Participation Level: Active  Participation Quality: Appropriate and Attentive  Affect: Euthymic  Cognitive: Appropriate   Additional Comments: Activity: Adapted Toilet Paper Game; Explanation: Patients were asked to select the amount of toilet paper they estimated they will need for the rest of the afternoon. Patients were asked to count their squares and announce the number to the group. Patients were then asked to break into groups of 2 -3 patients, using the average number of squares (11) patient groups were asked to identify that number of ways they can positively make changes to their lives. Group lists were then compared on the white board. Opening group discussion focused on the types of lifestyle changes that can be made.   Patient actively participated in group activity. Patient worked well with peers in group. Patient with peers developed a list of primarily mind set changes, such as be patient, and stay positive. Patient was unable to identify one suggestion written on the board he could not implement into his life. Patient contributed to wrap up discussion about the importance of positive life changes to sobriety and overall wellbeing.   Marykay Lex Kampbell Holaway, LRT/CTRS  Jearl Klinefelter 08/26/2012 4:45 PM

## 2012-08-26 NOTE — H&P (Signed)
Psychiatric Admission Assessment Adult  Patient Identification:  Trevor Mendez  Date of Evaluation:  08/26/2012  Chief Complaint:  DEPRESSIVE DISORDER, ETOH DEPENDENCE  History of Present Illness:This is a 32 year old Caucasian male. Admitted to [facility] as a walk-in complaining of problem with Xanax and alcohol abuse/dependence. He is here requesting detoxification treatment and tools to help him stay sober. Patient reports, "I have been battling depression for a couple of years. I was also having some anxiety with it. My doctor prescribed me some Xanax last November to take up to 3 times daily as needed.  It got to a point whereby I know I am abusing it. When not abusing Xanax, I was abusing alcohol. I will substitute Xanax with alcohol. The Xanax is for panic attacks. I was having it 3 times a week. At the time this was going on, I was having a rough time, tough job and a rough boss. I was also walking 56 hours a week. Things started to spiral out of control for me. But now, work is better. But I can't seem to be able to quit Xanax. I was not feeling suicidal per say, rather, I did not care if I live or die. That did scare me quite a bit. I came in here to get cleaned off of this drug and then given a tool to work with to deal with stress and anxiety. I don't want to be on any kind of medicine after this detox".   Elements:  Location:  [facility] adult unit. Quality:  Excessive use of either and or Xanax. Severity:  "I know that I was abusing Xanax and alcohol". Timing:  Started last November, 2013. Duration:  It's been about 7 months. Context:  Excessive use of alcohol in place of Xanax, and or xanax in place of alcohol.  Associated Signs/Synptoms:  Depression Symptoms:  feelings of worthlessness/guilt, anxiety,  (Hypo) Manic Symptoms:  Impulsivity,  Anxiety Symptoms:  Excessive Worry,  Psychotic Symptoms:  Hallucinations: Denies  PTSD Symptoms: Had a traumatic exposure:  None  reported  Psychiatric Specialty Exam: Physical Exam  Constitutional: He appears well-developed and well-nourished.  HENT:  Head: Normocephalic.  Eyes: Pupils are equal, round, and reactive to light.  Neck: Normal range of motion.  Cardiovascular: Normal rate.   Respiratory: Effort normal.  GI: Soft.  Musculoskeletal: Normal range of motion.  Neurological: He is alert.  Skin: Skin is warm.  Psychiatric: His speech is normal and behavior is normal. Judgment and thought content normal. His mood appears anxious. Cognition and memory are normal.    Review of Systems  Constitutional: Positive for malaise/fatigue.  HENT: Negative.   Respiratory: Negative.   Cardiovascular: Negative.   Gastrointestinal: Negative.   Musculoskeletal: Negative.   Skin: Negative.   Neurological: Positive for tremors and weakness.  Endo/Heme/Allergies: Negative.   Psychiatric/Behavioral: Positive for substance abuse. Negative for depression, suicidal ideas, hallucinations and memory loss. The patient is nervous/anxious. The patient does not have insomnia.     Blood pressure 94/59, pulse 72, temperature 97.4 F (36.3 C), temperature source Oral, resp. rate 16, height 5\' 10"  (1.778 m), weight 67.586 kg (149 lb).Body mass index is 21.38 kg/(m^2).  General Appearance: Fairly Groomed  Patent attorney::  Good  Speech:  Clear and Coherent  Volume:  Normal  Mood:  Anxious  Affect:  Congruent  Thought Process:  Coherent and Goal Directed  Orientation:  Full (Time, Place, and Person)  Thought Content:  Rumination  Suicidal Thoughts:  No  Homicidal Thoughts:  No  Memory:  Immediate;   Good Recent;   Good Remote;   Good  Judgement:  Fair  Insight:  Good  Psychomotor Activity:  Normal  Concentration:  Good  Recall:  Good  Akathisia:  No  Handed:  Right  AIMS (if indicated):     Assets:  Communication Skills Desire for Improvement Physical Health  Sleep:  Number of Hours: 6.25    Past Psychiatric  History: Diagnosis: Benzodiazepine dependence  Hospitalizations: [facility]  Outpatient Care: [facility]  Substance Abuse Care: None   Self-Mutilation: Denies  Suicidal Attempts: Denies attempts and or thoughts  Violent Behaviors: None reported   Past Medical History:   Past Medical History  Diagnosis Date  . MIGRAINE HEADACHE 04/10/2007    Qualifier: Diagnosis of  By: Genelle Gather CMA, [place]    . DEPRESSION, MAJOR, SEVERE 09/13/2009    Qualifier: Diagnosis of  By: Debby Bud MD, Rosalyn Gess   . HTN (hypertension) 10/09/2011   Cardiac History:  HTN  Allergies:   Allergies  Allergen Reactions  . Codeine Nausea And Vomiting   PTA Medications: Prescriptions prior to admission  Medication Sig Dispense Refill  . ALPRAZolam (XANAX) 0.5 MG tablet Take 1 tablet (0.5 mg total) by mouth 3 (three) times daily as needed for anxiety.  90 tablet  1  . amLODipine (NORVASC) 5 MG tablet Take 5 mg by mouth daily.      . Aspirin-Acetaminophen-Caffeine (GOODY HEADACHE PO) Take 1 packet by mouth daily.      Marland Kitchen FLUoxetine (PROZAC) 20 MG capsule TAKE ONE CAPSULE BY MOUTH TWICE A DAY  60 capsule  3  . hydrochlorothiazide (MICROZIDE) 12.5 MG capsule Take 12.5 mg by mouth daily.        Previous Psychotropic Medications:  Medication/Dose  See medication lists               Substance Abuse History in the last 12 months:  yes  Consequences of Substance Abuse: Medical Consequences:  Liver damage, Possible death by overdose Legal Consequences:  Arrests, jail time, Loss of driving privilege. Family Consequences:  Family discord, divorce and or separation.  Social History:  reports that he has been smoking.  He uses smokeless tobacco. He reports that  drinks alcohol. He reports that he uses illicit drugs (Benzodiazepines). Additional Social History: History of alcohol / drug use?: Yes Name of Substance 1: Xanax 1 - Age of First Use: 31 1 - Amount (size/oz): 1.5 mg 1 - Frequency: daily 1 - Duration: 7 months 1  - Last Use / Amount: today Name of Substance 2: alcohol 2 - Frequency: 14-18 hours  2 - Duration: daily  2 - Last Use / Amount: 7 days ago  Current Place of Residence: [place], [place]   Place of Birth: 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  Legal History: None pending  Hobbies/Interests: None reported  Family History:   Family History  Problem Relation Age of Onset  . Hypertension Mother   . Hypertension Father     Results for orders placed during the hospital encounter of 08/25/12 (from the past 72 hour(s))  CBC     Status: Abnormal   Collection Time  08/25/12 10:45 AM      Result Value Range   WBC 6.9  4.0 - 10.5 K/uL   RBC 5.07  4.22 - 5.81 MIL/uL   Hemoglobin 13.8  13.0 - 17.0 g/dL   HCT 91.4  78.2 - 95.6 %   MCV 81.5  78.0 - 100.0 fL   MCH 27.2  26.0 - 34.0 pg   MCHC 33.4  30.0 - 36.0 g/dL   RDW 21.3 (*) 08.6 - 57.8 %   Platelets 270  150 - 400 K/uL  COMPREHENSIVE METABOLIC PANEL     Status: Abnormal   Collection Time    08/25/12 10:45 AM      Result Value Range   Sodium 138  135 - 145 mEq/L   Potassium 3.3 (*) 3.5 - 5.1 mEq/L   Chloride 100  96 - 112 mEq/L   CO2 28  19 - 32 mEq/L   Glucose, Bld 97  70 - 99 mg/dL   BUN 11  6 - 23 mg/dL   Creatinine, Ser 4.69  0.50 - 1.35 mg/dL   Calcium 9.4  8.4 - 62.9 mg/dL   Total Protein 7.0  6.0 - 8.3 g/dL   Albumin 3.9  3.5 - 5.2 g/dL   AST 27  0 - 37 U/L   ALT 28  0 - 53 U/L   Alkaline Phosphatase 63  39 - 117 U/L   Total Bilirubin 0.4  0.3 - 1.2 mg/dL   GFR calc non Af Amer >90  >90 mL/min   GFR calc Af Amer >90  >90 mL/min   Comment:            The eGFR has been  calculated     using the CKD EPI equation.     This calculation has not been     validated in all clinical     situations.     eGFR's persistently     <90 mL/min signify     possible Chronic Kidney Disease.  ETHANOL     Status: None   Collection Time    08/25/12 10:45 AM      Result Value Range   Alcohol, Ethyl (B) <11  0 - 11 mg/dL   Comment:            LOWEST DETECTABLE LIMIT FOR     SERUM ALCOHOL IS 11 mg/dL     FOR MEDICAL PURPOSES ONLY  URINE RAPID DRUG SCREEN (HOSP PERFORMED)     Status: Abnormal   Collection Time    08/25/12 11:34 AM      Result Value Range   Opiates NONE DETECTED  NONE DETECTED   Cocaine NONE DETECTED  NONE DETECTED   Benzodiazepines POSITIVE (*) NONE DETECTED   Amphetamines NONE DETECTED  NONE DETECTED   Tetrahydrocannabinol NONE DETECTED  NONE DETECTED   Barbiturates NONE DETECTED  NONE DETECTED   Comment:            DRUG SCREEN FOR MEDICAL PURPOSES     ONLY.  IF CONFIRMATION IS NEEDED     FOR ANY PURPOSE, NOTIFY LAB     WITHIN 5 DAYS.                LOWEST DETECTABLE LIMITS     FOR URINE DRUG SCREEN     Drug Class       Cutoff (ng/mL)     Amphetamine      1000     Barbiturate  200     Benzodiazepine   200     Tricyclics       300     Opiates          300     Cocaine          300     THC              50   Psychological Evaluations:  Assessment:   AXIS I:  Alcohol Abuse and Benzodiazeoine dependence AXIS II:  Deferred AXIS III:   Past Medical History  Diagnosis Date  . MIGRAINE HEADACHE 04/10/2007    Qualifier: Diagnosis of  By: Genelle Gather CMA, [place]    . DEPRESSION, MAJOR, SEVERE 09/13/2009    Qualifier: Diagnosis of  By: Debby Bud MD, Rosalyn Gess   . HTN (hypertension) 10/09/2011   AXIS IV:  Substance dependence AXIS V:  41-50 serious symptoms  Treatment Plan/Recommendations: 1. Admit for crisis management and stabilization, estimated length of stay 3-5 days.  2. Medication management to reduce current symptoms to base line and  improve the patient's overall level of functioning  3. Treat health problems as indicated.  4. Develop treatment plan to decrease risk of relapse upon discharge and the need for readmission.  5. Psycho-social education regarding relapse prevention and self care.  6. Health care follow up as needed for medical problems.  7. Review, reconcile, and reinstate any pertinent home medications for other health issues where appropriate. 8. Call for consults with hospitalist for any additional specialty patient care services as needed.  Treatment Plan Summary: Daily contact with patient to assess and evaluate symptoms and progress in treatment Medication management  Current Medications:  Current Facility-Administered Medications  Medication Dose Route Frequency Provider Last Rate Last Dose  . acetaminophen (TYLENOL) tablet 650 mg  650 mg Oral Q6H PRN Sanjuana Kava, NP      . alum & mag hydroxide-simeth (MAALOX/MYLANTA) 200-200-20 MG/5ML suspension 30 mL  30 mL Oral Q4H PRN Sanjuana Kava, NP      . amLODipine (NORVASC) tablet 5 mg  5 mg Oral Daily Sanjuana Kava, NP   5 mg at 08/26/12 0826  . chlordiazePOXIDE (LIBRIUM) capsule 25 mg  25 mg Oral Q6H PRN Sanjuana Kava, NP      . chlordiazePOXIDE (LIBRIUM) capsule 25 mg  25 mg Oral TID Sanjuana Kava, NP   25 mg at 08/26/12 0826   Followed by  . [START ON 08/27/2012] chlordiazePOXIDE (LIBRIUM) capsule 25 mg  25 mg Oral BH-qamhs Sanjuana Kava, NP       Followed by  . [START ON 08/28/2012] chlordiazePOXIDE (LIBRIUM) capsule 25 mg  25 mg Oral Daily Sanjuana Kava, NP      . FLUoxetine (PROZAC) capsule 20 mg  20 mg Oral BID Sanjuana Kava, NP   20 mg at 08/26/12 1610  . hydrochlorothiazide (MICROZIDE) capsule 12.5 mg  12.5 mg Oral Daily Sanjuana Kava, NP   12.5 mg at 08/26/12 0825  . hydrOXYzine (ATARAX/VISTARIL) tablet 25 mg  25 mg Oral Q6H PRN Sanjuana Kava, NP      . loperamide (IMODIUM) capsule 2-4 mg  2-4 mg Oral PRN Sanjuana Kava, NP      . magnesium  hydroxide (MILK OF MAGNESIA) suspension 30 mL  30 mL Oral Daily PRN Sanjuana Kava, NP      . multivitamin with minerals tablet 1 tablet  1 tablet Oral Daily Sanjuana Kava, NP  1 tablet at 08/26/12 0825  . nicotine (NICODERM CQ - dosed in mg/24 hours) patch 21 mg  21 mg Transdermal Q0600 Sanjuana Kava, NP   21 mg at 08/26/12 0454  . ondansetron (ZOFRAN-ODT) disintegrating tablet 4 mg  4 mg Oral Q6H PRN Sanjuana Kava, NP      . thiamine (VITAMIN B-1) tablet 100 mg  100 mg Oral Daily Sanjuana Kava, NP   100 mg at 08/26/12 0825  . traZODone (DESYREL) tablet 50 mg  50 mg Oral QHS,MR X 1 Kerry Hough, PA-C        Observation Level/Precautions:  15 minute checks  Laboratory:  Reviewed ED lab findings on file  Psychotherapy: Group sessions, AA/NA meeting   Medications: See medication lists   Consultations:  As needed  Discharge Concerns:  Sobriety  Estimated LOS: 3-5 days  Other:     I certify that inpatient services furnished can reasonably be expected to improve the patient's condition.   Sanjuana Kava, PMHNP-BC 6/5/201411:05 AM  Patient is personally seen and examined, case discussed with physician extender and reviewed the information documented and agree with the treatment plan.   Durwood Dittus,JANARDHAHA R. 08/26/2012 6:38 PM

## 2012-08-26 NOTE — Progress Notes (Signed)
Adult Psychoeducational Group Note  Date:  08/26/2012 Time:  3:13 PM  Group Topic/Focus:  Self Esteem Action Plan:   The focus of this group is to help patients create a plan to continue to build self-esteem after discharge.  Participation Level:  Minimal  Participation Quality:  Appropriate and Attentive  Affect:  Appropriate  Cognitive:  Appropriate  Insight: Appropriate  Engagement in Group:  Limited  Modes of Intervention:  Activity and Discussion  Additional Comments:  Patient was quiet, but appropriate during group.   Lyndee Hensen 08/26/2012, 3:13 PM

## 2012-08-27 DIAGNOSIS — F1310 Sedative, hypnotic or anxiolytic abuse, uncomplicated: Secondary | ICD-10-CM | POA: Diagnosis present

## 2012-08-27 DIAGNOSIS — F411 Generalized anxiety disorder: Secondary | ICD-10-CM | POA: Diagnosis present

## 2012-08-27 DIAGNOSIS — F329 Major depressive disorder, single episode, unspecified: Secondary | ICD-10-CM

## 2012-08-27 NOTE — Progress Notes (Addendum)
[facility] MD Progress Note  08/27/2012 4:43 PM Trevor Mendez  MRN:  [id number] Subjective:  States that he got to drink and overuse the Xanax he was being prescribed. Sates that he got dependent on Xanax. He was dealing with anxiety, panic attacks. 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 He states his mind has been really clouded until now. Feels that being off is already helping him. He admits that he got too overwhelmed, overworked, burned out, what created a lot of anxiety, and the Xanax that he used to help himself, turned against him Diagnosis:  Major Depression, Anxiety Disorder NOS  ADL's:  Intact  Sleep: Fair  Appetite:  Fair  Suicidal Ideation:  Plan:  denies Intent:  denies Means:  denies Homicidal Ideation:  Plan:  denies Intent:  denies Means:  denies AEB (as evidenced by):  Psychiatric Specialty Exam: Review of Systems  Constitutional: Negative.   HENT: Negative.   Eyes: Negative.   Respiratory: Negative.   Cardiovascular: Negative.   Gastrointestinal: Negative.   Genitourinary: Negative.   Musculoskeletal: Negative.   Skin: Negative.   Neurological: Negative.   Endo/Heme/Allergies: Negative.   Psychiatric/Behavioral: Positive for depression and substance abuse. The patient is nervous/anxious.     Blood pressure 104/70, pulse 61, temperature 96.6 F (35.9 C), temperature source Oral, resp. rate 16, height 5\' 10"  (1.778 m), weight 67.586 kg (149 lb).Body mass index is 21.38 kg/(m^2).  General Appearance: Fairly Groomed  Patent attorney::  Fair  Speech:  Clear and Coherent  Volume:  Normal  Mood:  Anxious and worried  Affect:  Appropriate  Thought Process:  Coherent and Goal Directed  Orientation:  Full (Time, Place, and Person)  Thought Content:  worries, concearns  Suicidal Thoughts:  No  Homicidal Thoughts:  No  Memory:  Immediate;   Fair Recent;   Fair Remote;   Fair  Judgement:  Fair   Insight:  Present  Psychomotor Activity:  Normal  Concentration:  Fair  Recall:  Fair  Akathisia:  No  Handed:  Right  AIMS (if indicated):     Assets:  Desire for Improvement  Sleep:  Number of Hours: 5.25   Current Medications: Current Facility-Administered Medications  Medication Dose Route Frequency Provider Last Rate Last Dose  . acetaminophen (TYLENOL) tablet 650 mg  650 mg Oral Q6H PRN Sanjuana Kava, NP      . alum & mag hydroxide-simeth (MAALOX/MYLANTA) 200-200-20 MG/5ML suspension 30 mL  30 mL Oral Q4H PRN Sanjuana Kava, NP      . amLODipine (NORVASC) tablet 5 mg  5 mg Oral Daily Sanjuana Kava, NP   5 mg at 08/27/12 0824  . chlordiazePOXIDE (LIBRIUM) capsule 25 mg  25 mg Oral Q6H PRN Sanjuana Kava, NP      . chlordiazePOXIDE (LIBRIUM) capsule 25 mg  25 mg Oral BH-qamhs Sanjuana Kava, NP   25 mg at 08/27/12 0824   Followed by  . [START ON 08/28/2012] chlordiazePOXIDE (LIBRIUM) capsule 25 mg  25 mg Oral Daily Sanjuana Kava, NP      . FLUoxetine (PROZAC) capsule 20 mg  20 mg Oral BID Sanjuana Kava, NP   20 mg at 08/27/12 0824  . hydrochlorothiazide (MICROZIDE) capsule 12.5 mg  12.5 mg Oral Daily Sanjuana Kava, NP   12.5 mg at 08/27/12 0824  . hydrOXYzine (ATARAX/VISTARIL) tablet 25 mg  25 mg Oral Q6H  PRN Sanjuana Kava, NP      . loperamide (IMODIUM) capsule 2-4 mg  2-4 mg Oral PRN Sanjuana Kava, NP      . magnesium hydroxide (MILK OF MAGNESIA) suspension 30 mL  30 mL Oral Daily PRN Sanjuana Kava, NP      . multivitamin with minerals tablet 1 tablet  1 tablet Oral Daily Sanjuana Kava, NP   1 tablet at 08/27/12 0824  . nicotine (NICODERM CQ - dosed in mg/24 hours) patch 21 mg  21 mg Transdermal Q0600 Sanjuana Kava, NP   21 mg at 08/27/12 0981  . ondansetron (ZOFRAN-ODT) disintegrating tablet 4 mg  4 mg Oral Q6H PRN Sanjuana Kava, NP      . thiamine (VITAMIN B-1) tablet 100 mg  100 mg Oral Daily Sanjuana Kava, NP   100 mg at 08/27/12 0824  . traZODone (DESYREL) tablet 50 mg  50 mg  Oral QHS,MR X 1 Spencer E Simon, PA-C   50 mg at 08/26/12 2356    Lab Results: No results found for this or any previous visit (from the past 48 hour(s)).  Physical Findings: AIMS: Facial and Oral Movements Muscles of Facial Expression: None, normal Lips and Perioral Area: None, normal Jaw: None, normal Tongue: None, normal,Extremity Movements Upper (arms, wrists, hands, fingers): None, normal Lower (legs, knees, ankles, toes): None, normal, Trunk Movements Neck, shoulders, hips: None, normal, Overall Severity Severity of abnormal movements (highest score from questions above): None, normal Incapacitation due to abnormal movements: None, normal Patient's awareness of abnormal movements (rate only patient's report): No Awareness, Dental Status Current problems with teeth and/or dentures?: No Does patient usually wear dentures?: No  CIWA:  CIWA-Ar Total: 0 COWS:  COWS Total Score: 4  Treatment Plan Summary: Daily contact with patient to assess and evaluate symptoms and progress in treatment Medication management  Plan: Supportive approach/coping skills/relapse prevention           CBT to address the anxiety/stress management           Will discharge tomorrow with plans to start  the [facility] on Monday Medical Decision Making Problem Points:  Review of psycho-social stressors (1) Data Points:  Review of medication regiment & side effects (2)  I certify that inpatient services furnished can reasonably be expected to improve the patient's condition.   Haroon Shatto A 08/27/2012, 4:43 PM

## 2012-08-27 NOTE — Progress Notes (Signed)
Patient ID: Trevor Mendez, male   DOB: 06/11/1980, 31 y.o.   MRN: [id number]  D: Took over patient's care @ 0330. Patient in bed sleeping. Respiration regular and unlabored. No sign of distress noted at this time A: 15 mins checks for safety. R: Patient remains asleep. Pt is safe.  

## 2012-08-27 NOTE — Tx Team (Signed)
Interdisciplinary Treatment Plan Update (Adult)  Date: 08/27/2012  Time Reviewed: 9:45 AM   Progress in Treatment: Attending groups: Yes Participating in groups: Yes Taking medication as prescribed:  Yes Tolerating medication:  Yes Family/Significant othe contact made: No, N/A Patient understands diagnosis: Yes Discussing patient identified problems/goals with staff: Yes Medical problems stabilized or resolved:  Yes Denies suicidal/homicidal ideation: Yes Patient has not harmed self or Others: Yes  New problem(s) identified: None Identified  Discharge Plan or Barriers:  Pt will follow up with [facility] with [facility].    Additional comments: N/A  Reason for Continuation of Hospitalization: Withdrawal Symptoms Complete detox protocol Depression  Estimated length of stay: 2 days, D/C Sunday  For review of initial/current patient goals, please see plan of care.  Attendees: Patient:     Family:     Physician:  Dr. Geoffery Lyons 08/27/2012 9:48 AM   Nursing:   Alease Frame RN 08/27/2012 9:48 AM   Clinical Social Worker: Reyes Ivan, LCSWA 08/27/2012 9:48 AM   Other:  Quintella Reichert, RN 08/27/2012 9:48 AM   Other:  Trula Slade, LCSWA 08/27/2012 9:48 AM   Other:  Massie Kluver, Community Care Coordinator 08/27/2012 9:48 AM   Other:  Ronda Fairly, LCSWA 08/27/2012 9:48 AM    Scribe for Treatment Team:   Reyes Ivan, LCSWA  08/27/2012 9:48 AM

## 2012-08-27 NOTE — Progress Notes (Signed)
D apatient states he slept fair today, appetie is good, going to DR for meals, energy level is high and aiblity to pay attention is good, depressed 1/10 and hopeless is 1/10 today, denies Si or HI and no AVH, wanting to take advantage of his second chance, taking meds as ordered by MD, attending group. A q63min safety checks continue and support offered, encouraged to continue going to group and participating R patient remains safe on the unit

## 2012-08-27 NOTE — [facility] Group Notes (Signed)
[facility] LCSW Aftercare Discharge Planning Group Note   08/27/2012 8:45 AM  Participation Quality:  Alert and Appropriate   Mood/Affect:  Appropriate  Depression Rating:  0-1  Anxiety Rating:  0-1  Thoughts of Suicide:  Pt denies SI/HI  Will you contract for safety?   Yes  Current AVH:  No  Plan for Discharge/Comments:  Pt attended discharge planning group and actively participated in group.  CSW provided pt with today's workbook.  Pt states that he wants to go to [facility] after he completes detox.  Pt states that he lives in [place], [place] with his wife and child and can return there.  No further needs voiced by pt at this time.    Transportation Means: Pt has access to transportation  Supports: Pt states that his family and wife are very supportive  Reyes Ivan, LCSWA 08/27/2012 9:51 AM

## 2012-08-27 NOTE — Progress Notes (Signed)
[facility] Group Notes:  (Nursing/MHT/Case Management/Adjunct)  Date:  08/26/2012 Time:  2100 Type of Therapy:  wrap up group  Participation Level:  Active  Participation Quality:  Appropriate, Attentive, Sharing and Supportive  Affect:  Appropriate  Cognitive:  Alert and Appropriate  Insight:  Good  Engagement in Group:  Engaged  Modes of Intervention:  Clarification, Education and Support  Summary of Progress/Problems: Pt feels as if he has been at his lowest, hit bottom. 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  Pt really enjoyed the AA speakers and is interested in continuing 12 step meetings once discharged.    Shelah Lewandowsky 08/27/2012, 3:48 AM

## 2012-08-27 NOTE — [facility] Group Notes (Signed)
[facility] LCSW Group Therapy  08/27/2012  1:15 PM   Type of Therapy:  Group Therapy  Participation Level:  Active  Participation Quality:  Appropriate and Attentive  Affect:  Appropriate  Cognitive:  Alert and Appropriate  Insight:  Developing/Improving and Engaged  Engagement in Therapy:  Developing/Improving and Engaged  Modes of Intervention:  Activity, Clarification, Confrontation, Discussion, Education, Exploration, Limit-setting, Orientation, Problem-solving, Rapport Building, Dance movement psychotherapist, Socialization and Support  Summary of Progress/Problems: The topic for today was feelings about relapse.  Pt discussed what relapse prevention is to them and identified triggers that they are on the path to relapse.  Pt processed their feeling towards relapse and was able to relate to peers.  Pt discussed coping skills that can be used for relapse prevention.   Pt shared that he has a lot of fear around the possibility of future relapses, as this is his first detox.  Pt was able to process these feelings of fear and anxiety about relapse and states that the possibility of relapse is always on his mind.  Pt actively participated in group discussion and provided feedback to peers.    Reyes Ivan, LCSWA 08/27/2012 2:19 PM

## 2012-08-27 NOTE — Progress Notes (Signed)
Adult Psychoeducational Group Note  Date:  08/27/2012 Time:  1:40 PM  Group Topic/Focus:  Relapse Prevention Planning:   The focus of this group is to define relapse and discuss the need for planning to combat relapse.  Participation Level:  Active  Participation Quality:  Appropriate and Attentive  Affect:  Appropriate  Cognitive:  Appropriate  Insight: Appropriate  Engagement in Group:  Engaged  Modes of Intervention:  Activity, Discussion and Education  Additional Comments:  Patient participated and contributed to an activity involving the creation of a relapse prevention plan. Patient shared experiences and remained appropriate during group.   Raeden Schippers R 08/27/2012, 1:40 PM

## 2012-08-28 DIAGNOSIS — F1310 Sedative, hypnotic or anxiolytic abuse, uncomplicated: Secondary | ICD-10-CM

## 2012-08-28 MED ORDER — TRAZODONE HCL 50 MG PO TABS: 50.0000 mg | ORAL_TABLET | Freq: Every evening | ORAL | Status: DC | PRN

## 2012-08-28 MED ORDER — AMLODIPINE BESYLATE 5 MG PO TABS: 5.0000 mg | ORAL_TABLET | Freq: Every day | ORAL | Status: DC

## 2012-08-28 MED ORDER — HYDROCHLOROTHIAZIDE 12.5 MG PO CAPS: 12.5000 mg | ORAL_CAPSULE | Freq: Every day | ORAL | Status: DC

## 2012-08-28 NOTE — [facility] Group Notes (Signed)
[facility] Group Notes:  (Clinical Social Work)  08/28/2012     10-11AM  Summary of Progress/Problems:   The main focus of today's process group was for the patient to identify ways in which they have in the past sabotaged their own recovery. Motivational Interviewing was utilized to ask the group members what they get out of their substance use, and what reasons they may have for wanting to change.  The Stages of Change were explained using a handout, and patients identified where they currently are with regard to stages of change.  The patient expressed that he uses pills and alcohol due to stress, for a release or break from reality.  He wants to stop because he is like "a t--- in a toilet bowl going down the drain, getting closer and closer".  He feels he is in preparation stage, and his wife is cleaning out all the medicine cabinets while he is in the hospital, as his father is cleaning out all the alcohol.  He is starting CD-IOP on Monday as well.  He wants to make sure the bottle of Xanax that he brought into the hospital, which has been placed in his locker, is destroyed and does not go home with him.  Type of Therapy:  Group Therapy - Process   Participation Level:  Active  Participation Quality:  Appropriate, Attentive, Sharing and Supportive  Affect:  Appropriate  Cognitive:  Alert, Appropriate and Oriented  Insight:  Engaged  Engagement in Therapy:  Engaged  Modes of Intervention:  Education, Support and Processing, Motivational Interviewing  Ambrose Mantle, LCSW 08/28/2012, 12:08 PM

## 2012-08-28 NOTE — Progress Notes (Signed)
D. Pt was up this evening and visible and participating in various activities. Pt did endorse some feelings of depression and anxiety but has denied any SI  Pt did speak about only sleeping about 3 hours last night and was hoping that he would sleep better this evening and is hoping to be discharged during the weekend. Pt has not verbalized any complaints of pain this evening. A. Support and encouragement provided, medication education complete. R. Pt verbalized understanding, will continue to monitor.

## 2012-08-28 NOTE — Progress Notes (Signed)
Patient was discharged left with all belongings and follow up information   Pt requested this writer to dispose of his remaining 7 xanax tablets and same was done in front of him in the sharps container   Pt wife came to pick him up  He verbalized understanding of all follow up instructions   He denies suicidal and homicidal ideation and his mood is within normal limits for this patient

## 2012-08-28 NOTE — Progress Notes (Signed)
Patient ID: Trevor Mendez, male   DOB: 08/10/1980, 32 y.o.   MRN: [id number]  D: Took over patient's care @ 0330. Patient in bed sleeping. Respiration regular and unlabored. No sign of distress noted at this time A: 15 mins checks for safety. R: Patient remains asleep. Pt is safe.

## 2012-08-28 NOTE — [facility] Suicide Risk Assessment (Signed)
Suicide Risk Assessment  Discharge Assessment     Demographic Factors:  Male  Mental Status Per Nursing Assessment::   On Admission:  Self-harm thoughts  Current Mental Status by Physician: no si, mood ok, no hi, no avh  Loss Factors: Decrease in vocational status  Historical Factors: Impulsivity  Risk Reduction Factors:   Employed  Continued Clinical Symptoms:  Severe Anxiety and/or Agitation  Cognitive Features That Contribute To Risk:  Closed-mindedness    Suicide Risk:  Minimal: No identifiable suicidal ideation.  Patients presenting with no risk factors but with morbid ruminations; may be classified as minimal risk based on the severity of the depressive symptoms  Discharge Diagnoses:   AXIS I:  Substance Abuse AXIS II:  Deferred AXIS III:   Past Medical History  Diagnosis Date  . MIGRAINE HEADACHE 04/10/2007    Qualifier: Diagnosis of  By: Genelle Gather CMA, [place]    . DEPRESSION, MAJOR, SEVERE 09/13/2009    Qualifier: Diagnosis of  By: Debby Bud MD, Rosalyn Gess   . HTN (hypertension) 10/09/2011   AXIS IV:  other psychosocial or environmental problems AXIS V:  51-60 moderate symptoms  Plan Of Care/Follow-up recommendations:  Activity:  as tolerated  Is patient on multiple antipsychotic therapies at discharge:  No   Has Patient had three or more failed trials of antipsychotic monotherapy by history:  No  Recommended Plan for Multiple Antipsychotic Therapies: Additional reason(s) for multiple antispychotic treatment:  n/a  Wonda Cerise 08/28/2012, 11:17 AM

## 2012-08-28 NOTE — Discharge Summary (Signed)
Physician Discharge Summary Note  Patient:  Trevor Mendez is an 32 y.o., male MRN:  [id number] DOB:  11/01/1980 Patient phone:  [phone] (home)  Patient address:   [address] [place] [address],   Date of Admission:  08/25/2012 Date of Discharge: 08/28/2012  Reason for Admission:  Benzodiazepine depend and alcohol depe  Discharge Diagnoses: Active Problems:   Benzodiazepine abuse, continuous   Generalized anxiety disorder  Review of Systems  Constitutional: Negative.   HENT: Negative.   Eyes: Negative.   Respiratory: Negative.   Cardiovascular: Negative.   Gastrointestinal: Negative.   Genitourinary: Negative.   Musculoskeletal: Negative.   Skin: Negative.   Neurological: Negative.   Endo/Heme/Allergies: Negative.   Psychiatric/Behavioral: Positive for substance abuse (Was treated for alcohol dependaence and will be starting IOP from Monday). Negative for depression, suicidal ideas, hallucinations and memory loss. The patient has insomnia (Was treated with Trazadone and will be going home on Trazadone for sleep). The patient is not nervous/anxious.    Axis Diagnosis:   AXIS I:  Alcohol Abuse, Generalized Anxiety Disorder and Major Depression, Recurrent severe AXIS II:  Deferred AXIS III:   Past Medical History  Diagnosis Date  . MIGRAINE HEADACHE 04/10/2007    Qualifier: Diagnosis of  By: Genelle Gather CMA, [place]    . DEPRESSION, MAJOR, SEVERE 09/13/2009    Qualifier: Diagnosis of  By: Debby Bud MD, Rosalyn Gess   . HTN (hypertension) 10/09/2011   AXIS IV:  other psychosocial or environmental problems and problems related to social environment AXIS V:  61-70 mild symptoms  Level of Care:  IOP  Hospital Course:  Trevor Mendez is an 32 y.o. male.Referred to the assessment office from the outpatient department to be admitted for detox. He was accompanied by his wife. He states he had received a phone call this am from the outpatient dept to come in to be  admitted at 10 am today. He is prepared and has brought his luggage. He is using 1.5mg  -4 mg of Xanax a day and has been using at this level since mid Nov 2013. He also has been drinking heavily 8-12 beers qd for years. He had his last drink seven days ago and took 1.5 mg of Xanax 20 min before arrival here.He denies suicidal ideation and is not homicidal or psychotic.Marland Kitchen He is willing and ready for detox and after detox plans to return to Behavioral health for CD IOP.Marland Kitchen  Today Mr Trevor Mendez is being discharged home.  He successfully was detoxed off of alcohol and Benzodiazepines.  While in the unit he participated in group sessions, was compliant with his medications.  He is alert, oriented x4 and participated in discharge interview and instructions.  He states he will be starting intensive outpatient treatment on Monday and may also join AA meetings.  Patient also does not want to return home  With his  left over xanax anymore.  Patient will be discharged home as soon as his ride who is his wife arrives.  He denies SI/HI/AVH.     Consults:  None  Significant Diagnostic Studies:  labs: CBC, CMP, TOXICOLOGY,UDS,UA  Discharge Vitals:   Blood pressure 114/73, pulse 68, temperature 97.9 F (36.6 C), temperature source Oral, resp. rate 18, height 5\' 10"  (1.778 m), weight 67.586 kg (149 lb). Body mass index is 21.38 kg/(m^2). Lab Results:   Results for orders placed during the hospital encounter of 08/25/12 (from the past 72 hour(s))  URINE RAPID DRUG SCREEN (HOSP PERFORMED)  Status: Abnormal   Collection Time    08/25/12 11:34 AM      Result Value Range   Opiates NONE DETECTED  NONE DETECTED   Cocaine NONE DETECTED  NONE DETECTED   Benzodiazepines POSITIVE (*) NONE DETECTED   Amphetamines NONE DETECTED  NONE DETECTED   Tetrahydrocannabinol NONE DETECTED  NONE DETECTED   Barbiturates NONE DETECTED  NONE DETECTED   Comment:            DRUG SCREEN FOR MEDICAL PURPOSES     ONLY.  IF CONFIRMATION IS  NEEDED     FOR ANY PURPOSE, NOTIFY LAB     WITHIN 5 DAYS.                LOWEST DETECTABLE LIMITS     FOR URINE DRUG SCREEN     Drug Class       Cutoff (ng/mL)     Amphetamine      1000     Barbiturate      200     Benzodiazepine   200     Tricyclics       300     Opiates          300     Cocaine          300     THC              50    Physical Findings: AIMS: Facial and Oral Movements Muscles of Facial Expression: None, normal Lips and Perioral Area: None, normal Jaw: None, normal Tongue: None, normal,Extremity Movements Upper (arms, wrists, hands, fingers): None, normal Lower (legs, knees, ankles, toes): None, normal, Trunk Movements Neck, shoulders, hips: None, normal, Overall Severity Severity of abnormal movements (highest score from questions above): None, normal Incapacitation due to abnormal movements: None, normal Patient's awareness of abnormal movements (rate only patient's report): No Awareness, Dental Status Current problems with teeth and/or dentures?: No Does patient usually wear dentures?: No  CIWA:  CIWA-Ar Total: 0 COWS:  COWS Total Score: 4  Psychiatric Specialty Exam: See Psychiatric Specialty Exam and Suicide Risk Assessment completed by Attending Physician prior to discharge.  Discharge destination:  Home  Is patient on multiple antipsychotic therapies at discharge:  No   Has Patient had three or more failed trials of antipsychotic monotherapy by history:  No  Recommended Plan for Multiple Antipsychotic Therapies: NA  Discharge Orders   Future Orders Complete By Expires     Discharge instructions  As directed     Comments:      Follow up with your psychiatrist/ medical doctors for your healthcare need.  Abstain from alcohol and other substance use.  Take all medications as ordered.        Medication List    STOP taking these medications       ALPRAZolam 0.5 MG tablet  Commonly known as:  XANAX     GOODY HEADACHE PO      TAKE these  medications     Indication   amLODipine 5 MG tablet  Commonly known as:  NORVASC  Take 1 tablet (5 mg total) by mouth daily.   Indication:  High Blood Pressure     FLUoxetine 20 MG capsule  Commonly known as:  PROZAC  TAKE ONE CAPSULE BY MOUTH TWICE A DAY      hydrochlorothiazide 12.5 MG capsule  Commonly known as:  MICROZIDE  Take 1 capsule (12.5 mg total) by mouth daily.   Indication:  High Blood Pressure     traZODone 50 MG tablet  Commonly known as:  DESYREL  Take 1 tablet (50 mg total) by mouth at bedtime and may repeat dose one time if needed.   Indication:  Trouble Sleeping           Follow-up Information   Follow up with [facility] - Outpatient - CDIOP On 08/30/2012. (Meet with Dewayne Hatch at 10:30 am and begin group on this date.  Groups are Monday, Wednesday, Friday 1 - 4 pm)    Contact information:   [address] [place], [place] [address] [phone]      Follow-up recommendations: Other:  Activities as tolerated, diet as instructed by primary Physician, Follow up appointments as recommended. Other:  Activities as tolerated, diet as instructed by primary Physician, Follow up appointments as recommended.   Comments:  Take your medicated as instructed.  Do not use alcohol to self treat depression.   Go to the nearest hospital ER for any kind of emergency.  Keep all appointment with your other Physicians for your other medical issues.   Total Discharge Time:  Greater than 30 minutes.  SignedDahlia Byes, C  PMHNP-BC 08/28/2012, 11:02 AM

## 2012-08-28 NOTE — Progress Notes (Signed)
Psychoeducational Group Note  Date:  08/28/2012 Time:  0945 am  Group Topic/Focus:  Identifying Needs:   The focus of this group is to help patients identify their personal needs that have been historically problematic and identify healthy behaviors to address their needs.  Participation Level:  Active  Participation Quality:  Appropriate  Affect:  Appropriate  Cognitive:  Appropriate  Insight:  Engaged  Engagement in Group:  Engaged  Additional Comments:    Andrena Mews 08/28/2012,3:35 PM

## 2012-08-30 ENCOUNTER — Other Ambulatory Visit (HOSPITAL_COMMUNITY): Payer: 59 | Attending: Psychiatry

## 2012-08-30 DIAGNOSIS — F1020 Alcohol dependence, uncomplicated: Secondary | ICD-10-CM | POA: Insufficient documentation

## 2012-08-30 DIAGNOSIS — F411 Generalized anxiety disorder: Secondary | ICD-10-CM | POA: Insufficient documentation

## 2012-09-01 ENCOUNTER — Other Ambulatory Visit (HOSPITAL_COMMUNITY): Payer: 59 | Admitting: Psychology

## 2012-09-01 DIAGNOSIS — F1320 Sedative, hypnotic or anxiolytic dependence, uncomplicated: Secondary | ICD-10-CM

## 2012-09-01 DIAGNOSIS — F1020 Alcohol dependence, uncomplicated: Secondary | ICD-10-CM

## 2012-09-01 NOTE — Progress Notes (Signed)
Patient Discharge Instructions:  Next Level Care Provider Has Access to the EMR, 09/01/12 Records provided to [facility] via [facility]/Epic access.  Jerelene Redden, 09/01/2012, 4:02 PM

## 2012-09-02 ENCOUNTER — Encounter (HOSPITAL_COMMUNITY): Payer: Self-pay | Admitting: Psychology

## 2012-09-02 DIAGNOSIS — F1320 Sedative, hypnotic or anxiolytic dependence, uncomplicated: Secondary | ICD-10-CM | POA: Insufficient documentation

## 2012-09-02 DIAGNOSIS — F1020 Alcohol dependence, uncomplicated: Secondary | ICD-10-CM | POA: Insufficient documentation

## 2012-09-02 NOTE — Progress Notes (Signed)
    Daily Group Progress Note  Program: CD-IOP   Group Time: 1-2:30 pm  Participation Level: Active  Behavioral Response: Appropriate and Sharing  Type of Therapy: Psycho-ed  Topic: Chaplain: Addressing Grief and Loss. The first half of group was spent in a psycho-ed session with Theda Belfast, the head of the Chaplaincy Program at [facility]. After introductions, the chaplain asked group members to consider what they had lost or changed due to the initial loss, whether it was a job, a person, a relationship, or any kind of loss. Members shared about their lives and grief and were able to identify the ways their lives had, indirectly, been irreparably changed or altered due to the actual loss itself. The session proved to be a very emotional one with very intense disclosures among members. The group agreed this vision allowed them to see their losses from a different perspective than they had in the past and helped them express their losses differently.   Group Time: 2:45- 4pm  Participation Level: Active  Behavioral Response: Sharing  Type of Therapy: Process Group  Topic: Group Process: the second part of group was spent in process. Members shared about their progress in early recovery and some of the obstacles or challenges they have faced. During this session, the newer group members shared more about their lives and allowed their group members to get to know them and understand them a little more clearly. It was a good session and very comforting and validating to the group.   Summary: The patient was attentive and engaged actively in the group. He shared with the chaplain that when his grandfather died, a lot of things changed for him. He had great repect and love for him and his absence has been very hard. He shared that he had lost a part of himself with his grandfather's passing. He noted he had never grown tobacco after he passed. He made some good comments and responded well to  this session with the chaplain. In process, the patient reported he hd attended AA meetings on Monday, Tuesday and Wednesday evenings. He stated he was "shocked" to see a fellow who used to party and who lives just a couple of miles from him walk into the meeting. The patient admitted it is very hard for him to reach out for help and that he had never done it since he was 32 yo. I reminded him that asking for help is a sign of courage. Perhaps he could get to where he sees asking for help as recognizing when you can't do something by yourself and refusing to ask for help may be a sign of weakness. The patient received good feedback from his fellow group members and displayed a good commitment to his recovery.    Family Program: Family present? No   Name of family member(s):   UDS collected: Yes Results: not back yet  AA/NA attended?: YesMonday, Tuesday and Wednesday  Sponsor?: No   Fitzhugh Vizcarrondo, LCAS

## 2012-09-03 ENCOUNTER — Encounter (HOSPITAL_COMMUNITY): Payer: Self-pay | Admitting: Psychology

## 2012-09-03 ENCOUNTER — Other Ambulatory Visit (HOSPITAL_COMMUNITY): Payer: 59 | Admitting: Psychology

## 2012-09-03 NOTE — Progress Notes (Signed)
Patient ID: Trevor Mendez, male   DOB: 1980/09/03, 32 y.o.   MRN: [id number] Orientation to CD-IOP: The patient is a 32 yo married, Caucasian, male seeking treatment to address his chemical dependency. He was [facility] from [facility] after completing detox from alcohol and Xanax. The patient lives with his wife and infant daughter in [place], [place]. The patient reported he began drinking at age 49. He was raised in an alcoholic family household and reported that all of his relatives drink heavily. He would typically drink around 12 beers per day. The patient reported he had suffered from depression for a number of years and was prescribed Prozac. In the fall of 2013 he began experiencing panic attacks and in November of this past year, he was prescribed Xanax by his PCP. He admitted that he did not follow up with his physician's recommendation to begin working with a therapist. The patient quickly found himself taking more of the Xanax than was prescribed (anywhere from 3-8 per day) and he would run out of pills long before his refill was due. The patient reported he found himself drinking a lot more on days when he didn't have any pills, but at times he was able to buy Xanax from friends and never sought refills before they were scheduled. The patient is employed by [employer] which is located [place] here in [place]. 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 He is currently taking FMLA and reported he will do whatever he is instructed by the staff here in the outpatient program. The patient was extremely cooperative and appears very motivated to change his life. He was accompanied by his wife, Carollee Herter. She is very supportive of his efforts to remain alcohol and drug-free. All documentation was reviewed, signed, and the orientation completed accordingly. The patient will return at 1 pm and begin the CD-IOP today.

## 2012-09-06 ENCOUNTER — Other Ambulatory Visit (HOSPITAL_COMMUNITY): Payer: 59 | Admitting: Psychology

## 2012-09-06 ENCOUNTER — Encounter (HOSPITAL_COMMUNITY): Payer: Self-pay | Admitting: Psychology

## 2012-09-06 DIAGNOSIS — F1320 Sedative, hypnotic or anxiolytic dependence, uncomplicated: Secondary | ICD-10-CM

## 2012-09-06 DIAGNOSIS — F329 Major depressive disorder, single episode, unspecified: Secondary | ICD-10-CM

## 2012-09-06 DIAGNOSIS — F1020 Alcohol dependence, uncomplicated: Secondary | ICD-10-CM

## 2012-09-06 NOTE — Progress Notes (Unsigned)
Patient ID: Trevor Mendez, male   DOB: 30-May-1980, 32 y.o.   MRN: [id number] CD-IOP: Treatment Planning Session. I met with the patient this morning for his first individual therapy session. I explained the importance of identifying one's goals for treatment. He was in agreement that staying clean and sober was his primary treatment goal. The patient also admitted that he had been resistant at first to attend meetings, but very quickly, after going to a few, he has become a big advocate of the 12-step community. The patient opted to identify a third treatment goal. He explained that he would like to get off the hypertension medication he is prescribed. The patient reported that all the men on his father's side of the family have died by age 34. He intends to surpass that barrier. He explained that remaining alcohol-free, exercise, and working on reducing his stress could all help to get him off the medications when his BP lowers within the acceptable range. The patient displays excellent motivation and seems genuinely excited about his newfound sobriety. He shared that he has realized how his life goals had been gradually pushed to the side and he wasn't accomplishing the things he had set out to achieve in his early 20's. The patient agreed that being accountable is what he needs and he will continue to insure that he has others positioned so he has to be accountable to them. 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 I encouraged him to go slowly and not try and get everything caught up all at once. I reminded this patient that he has a very serious chronic illness, but to look at him, one would never know anything was wrong. He must pace himself and take time to reflect and be focused on the changes we are asking him to make. The patient was very pleasant and after reviewing and signing the treatment plan, our session ended. He will meet with the medical director later today for his  first session. We will continue to follow closely in the days ahead.

## 2012-09-06 NOTE — Progress Notes (Signed)
    Daily Group Progress Note  Program: CD-IOP   Group Time: 1-2:45 pm  Participation Level: Active  Behavioral Response: Appropriate and Sharing  Type of Therapy: Process Group  Topic: Group Process: the first part of group was spent in process. Members checked-in and discussed some of the problems and challenges they have been dealing with in early recovery. One member reported he had picked up his 30 day chip this morning and the group applauded this news. One member had to leave early because she had an interview and the group discussed some of the characteristics or qualities that they would want to hear about during an interview. During this time, the medical director was meeting with new group members and others who were requesting medication assistance or review.  Group Time: 3-4 pm  Participation Level: Active  Behavioral Response: Sharing  Type of Therapy: Activity Group  Topic: Guided Relaxation Exercise:  Second half of group was spent focusing on learning to relax, calm one's self, and deal with stress more effectively. I led a guided relaxation exercise and members closed their eyes, lights were dimmed, and members quietly followed my lead. At the conclusion of the exercise, all but one of the group members agreed that they had felt very relaxed. The one that didn't was the member that had returned after her interview. She admitted her mind had been racing, thinking of all the things that could happen. As the session concluded, members shared their recovery plans over the weekend. Random drug tests were also collected.   Summary: The patient reported he had felt very angry on Wednesday evening and had called a fellow group member. It had proven very helpful and he had also attended an AA meeting that night. The patient reported he has attended 3 AA meetings since the last group session and he is feels incredible strength and motivation through the people at the meetings. He  laughed and shared that he had discovered a refreshing drink at the end of the day. The patient explained that after water and Gatorade all day, an ice-cold Pepsi hit the spot. The patient provided good feedback to his fellow group members and made some good comments. During the second half of group the patient met with the Medical Director for the first time. 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 The patient is new in recovery, but has embraced everything we have asked of him. His sobriety date is 6/5.   Family Program: Family present? No   Name of family member(s):   UDS collected: Yes Results: not yet returned from lab  AA/NA attended?: YesWednesday, Thursday and Friday  Sponsor?: No   Caila Cirelli, LCAS

## 2012-09-07 ENCOUNTER — Other Ambulatory Visit (HOSPITAL_COMMUNITY): Payer: Self-pay | Admitting: Physician Assistant

## 2012-09-07 ENCOUNTER — Encounter (HOSPITAL_COMMUNITY): Payer: Self-pay | Admitting: Psychology

## 2012-09-07 MED ORDER — NALTREXONE HCL 50 MG PO TABS: 50.0000 mg | ORAL_TABLET | Freq: Every day | ORAL | Status: DC

## 2012-09-07 NOTE — Progress Notes (Signed)
    Daily Group Progress Note  Program: CD-IOP   Group Time: 1-2:30 pm  Participation Level: Active  Behavioral Response: Appropriate and Sharing  Type of Therapy: Process Group  Topic: Group Process/Psycho-ed. The first half of group was spent with a brief check-in and sharing about recovery activities over the past weekend. Group members were provided a handout called the "Wheel of Life". the exercise asks members to rank where they stand in 8 categories. Those categories included: Physical Environment, Fun and Recreation, Health, Personal Growth, S/O/Romance, Friends and Family, Event organiser, Arts administrator, and Dietitian. The purpose of the exercise is to focus on the importance of 'Balance' in one's life and it easily shows where one needs to focus their energies to achieve more balance and health in one's life. Members each came up and charted their "wheel" and explained the rankings to the group. Also present were two students from the [employer] program. They participated in the exercise and shared their thoughts along with group members.   Group Time: 2:45- 4 pm  Participation Level: Active  Behavioral Response: Sharing  Type of Therapy: Psycho-education Group  Topic: "The Wheel of Life.continued": the second half of group included more of the wheel with members drawing their wheels and explaining them to each other. There was good feedback and sharing among the group and the session proved very helpful in illuminating where members need to focus and apply more of their energies.   Summary: The patient ws attentive and shared that he had remained sober through the weekend, but admitted that he is struggling with cravings and thoughts of drinking. He also noted that he is very irritable and clumsy. 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 They are very typical in early recovery and are part of the brain's healing process. The patient  admitted that they bothered him and they were a worry to his wife as well. He reported he has an ongoing headache. He admitted he didn't like to take pills, but wondered if there was something he could get for the cravings? I assured him I would speak with the medical director and see if he could order some Naltrexone for the cravings. The patient drew his wheel and noted that it is much better than even 2 weeks ago. The patient explained that he really just needs to start all over again because he is so used to being drunk or high almost all the time. He finds it very difficult and awkward to do his old chores being sober. The patient received good feedback and was very honest about his current struggles. He made some excellent comments and his sobriety date remained 6/5.    Family Program: Family present? No   Name of family member(s):   UDS collected: No Results:   AA/NA attended?: YesFriday, Saturday and Sunday  Sponsor?: No, but he is looking   Khai Arrona, LCAS

## 2012-09-08 ENCOUNTER — Other Ambulatory Visit (HOSPITAL_COMMUNITY): Payer: 59 | Admitting: Psychology

## 2012-09-08 DIAGNOSIS — F1320 Sedative, hypnotic or anxiolytic dependence, uncomplicated: Secondary | ICD-10-CM

## 2012-09-08 DIAGNOSIS — F1020 Alcohol dependence, uncomplicated: Secondary | ICD-10-CM

## 2012-09-08 DIAGNOSIS — F329 Major depressive disorder, single episode, unspecified: Secondary | ICD-10-CM

## 2012-09-09 ENCOUNTER — Encounter (HOSPITAL_COMMUNITY): Payer: Self-pay | Admitting: Psychology

## 2012-09-09 NOTE — Progress Notes (Signed)
    Daily Group Progress Note  Program: CD-IOP   Group Time: 1-2:30 pm  Participation Level: Active  Behavioral Response: Appropriate and Sharing  Type of Therapy: Process Group  Topic: The Five Love Languages: second half of group was spent in a psycho-ed on the book written by Santina Taaj Hurlbut. Members were given a handout and asked to complete the 30 question form. After they had completed the form, members shared their findings. While some were surprised, many group members stated that the findings seemed very accurate, but they had never realized that was a type of love language. Every member with an S/O took a blank sheet home with them and agreed to have their partner complete the questionnaire. Everyone acknowledged that knowing your love language as well as your partner's language will benefit and enhance their relationship. The session proved effective for the group.   Group Time: 2:45- 4pm  Participation Level: Active  Behavioral Response: Sharing  Type of Therapy: Psycho-education Group  Topic: The Five Love Languages: second half of group was spent in a psycho-ed on the book written by Santina Majesty Stehlin. Members were given a handout and asked to complete the 30 question form. After they had completed the form, members shared their findings. While some were surprised, many group members stated that the findings seemed very accurate, but they had never realized that was a type of love language. Every member with an S/O took a blank sheet home with them and agreed to have their partner complete the questionnaire. Everyone acknowledged that knowing your love language as well as your partner's language will benefit and enhance their relationship. The session proved effective for the group.   Summary: The patient reported he had gotten the prescription to help address his cravings. In the previous session he had identified all of the symptoms found on the PAWS handout and admitted he was  really struggling with cravings. The patient reported he had attended 2 meetings since the last group session. He admitted that one can either bullshit through this program, but he didn't want to do that. The patient reported he is only now beginning to realize just how much work this "recovery thing" will require of him. But he insisted that he wanted to do it and would give everything of himself to transforming his life. The patient reported he found the 'quality time' to be his primary love language and was looking forward to how his wife would score. The patient is making excellent progress in his recovery and is doing everything we have asked of him. His sobriety date remains 6/5.   Family Program: Family present? No   Name of family member(s):   UDS collected: No Results:   AA/NA attended?: [place] and Tuesday  Sponsor?: No, but he is looking for one   Medard Decuir, LCAS

## 2012-09-10 ENCOUNTER — Other Ambulatory Visit (HOSPITAL_COMMUNITY): Payer: 59 | Admitting: Psychology

## 2012-09-10 DIAGNOSIS — F1320 Sedative, hypnotic or anxiolytic dependence, uncomplicated: Secondary | ICD-10-CM

## 2012-09-10 DIAGNOSIS — F1020 Alcohol dependence, uncomplicated: Secondary | ICD-10-CM

## 2012-09-10 DIAGNOSIS — F329 Major depressive disorder, single episode, unspecified: Secondary | ICD-10-CM

## 2012-09-12 ENCOUNTER — Encounter (HOSPITAL_COMMUNITY): Payer: Self-pay | Admitting: Psychology

## 2012-09-12 NOTE — Progress Notes (Signed)
    Daily Group Progress Note  Program: CD-IOP   Group Time: 1-2:45 pm  Participation Level: Active  Behavioral Response: Sharing  Type of Therapy: Process Group  Topic: Group Process/Introduction: The first half of group was spent in process. Members shared about current issues and concerns that they are dealing with in early recovery. There was also a new member in group and she shared about herself and what had brought her to seek treatment. During this session, the medical director was meeting individually with some of the new group members. Some very serious and intense issues were shared and it was an emotional time for some.   Group Time: 3-4 pm  Participation Level: Active  Behavioral Response: Sharing  Type of Therapy: Psycho-education Group  Topic: Addiction and the Relapse Process: the second half of group was spent in a psycho-ed session. Members were provided a handout that identified the difficulty in remaining sober and the chronic relapsing nature of this illness for many. The four stages of relapse were identified and members shared how they might address each stage to insure that they don't relapse.    Summary: The patient reported he had attended 2 meetings since the last group session. He asked about the newest medication he just began taking and wondered about it making him feel 'weird'. No one else is currently taking naltrexone, but I encouraged him to continue taking it and other members agreed that sometimes it takes a few days to get into one's system. The patient admitted he had not taken the medication this morning, but I had urged him to continue with it and it might prove very helpful for his cravings. The patient provided good support to his fellow group members and appeared genuinely hurt by some of the pain they are dealing with. He made some jokes throughout the session and the group finds him a positive and funny diversion. The patient noted he grew up  without talking because nobody else was talking. He reminded the group that in about a year they will feel a little better. 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 He remains committed to sobriety and his sobriety date is 6/5.   Family Program: Family present? No   Name of family member(s):   UDS collected: Yes Results: not yet returned  AA/NA attended?: YesWednesday, Thursday and Friday  Sponsor?: Yes   Amilee Janvier, LCAS

## 2012-09-13 ENCOUNTER — Other Ambulatory Visit (HOSPITAL_COMMUNITY): Payer: 59 | Admitting: Psychology

## 2012-09-13 DIAGNOSIS — F329 Major depressive disorder, single episode, unspecified: Secondary | ICD-10-CM

## 2012-09-13 DIAGNOSIS — F1320 Sedative, hypnotic or anxiolytic dependence, uncomplicated: Secondary | ICD-10-CM

## 2012-09-13 DIAGNOSIS — F1020 Alcohol dependence, uncomplicated: Secondary | ICD-10-CM

## 2012-09-15 ENCOUNTER — Other Ambulatory Visit (HOSPITAL_COMMUNITY): Payer: 59 | Admitting: Psychology

## 2012-09-15 ENCOUNTER — Encounter (HOSPITAL_COMMUNITY): Payer: Self-pay | Admitting: Psychology

## 2012-09-15 DIAGNOSIS — F329 Major depressive disorder, single episode, unspecified: Secondary | ICD-10-CM

## 2012-09-15 DIAGNOSIS — F1320 Sedative, hypnotic or anxiolytic dependence, uncomplicated: Secondary | ICD-10-CM

## 2012-09-15 DIAGNOSIS — F1020 Alcohol dependence, uncomplicated: Secondary | ICD-10-CM

## 2012-09-15 NOTE — Progress Notes (Signed)
    Daily Group Progress Note  Program: CD-IOP   Group Time: 1-2:30 pm  Participation Level: Active  Behavioral Response: Sharing  Type of Therapy: Process Group  Topic: Group Process/Introductions: the first part of group was spent in process. Members shared about their current struggles and issues in early recovery. One member expressed frustration with the Soboxone-positive drug test from last week, insisting that she hadn't used anything in almost a month. Another member shared about her challenges and pressures at work.  Two new members were present and they shared a little about their addiction and what had brought them into treatment. There was good disclosure and feedback among the group members.  Group Time: 2:45-4 pm  Participation Level: Active  Behavioral Response: Sharing  Type of Therapy: Psycho-education Group  Topic:Resentments; #1: the second half of group was spent discussing resentments. A handout was provided describing how resentments develop. They come when anger is not addressed and it eventually turns in to resentment. The group discussed some of the resentments they had and were able to identify some of the problems that resentments can create. The session proved very interesting with some members fully cognizant of their resentments, but unclear about whether or not they were ready to let them go.     Summary: the patient reported he had experienced a realization since the last session and wanted to share it. He explained that he had come to recognize that his anger was really "self-pity". He has been angry because he has this disease and either has to make these changes or losing everything he has worked so hard for and eventually lose his life. The patient had not realized what this "recovery thing" would require or how difficult it would prove to be. The patient received good feedback and validation from his fellow group members who admire his honesty. He  admitted since this realization, he isn't feeling quite so bad as he had been. In the second half of group, the patient challenged me about resentments and pointed out one just can't tell the boss he is stupid. I explained that this would be inappropriate, but that I would want him to begin practicing dealing with anger and not letting it build up into resentments, first with his loved ones and people he feels safe with. If he has an angry feeling, tell someone in a loving way how what they have done makes him feel. [name] made a joke about this, but was really quite serious about learning how to deal with his anger. The patient made some good comments and responded well to this intervention. His sobriety date remains 6/5.    Family Program: Family present? No   Name of family member(s):   UDS collected: Yes Results: not back yet  AA/NA attended?: [place]  Sponsor?: yes, a temporary one   Kollins Fenter, LCAS

## 2012-09-15 NOTE — Progress Notes (Signed)
    Daily Group Progress Note  Program: CD-IOP   Group Time: 1-2:30 pm  Participation Level: Active  Behavioral Response: Sharing  Type of Therapy: Process Group  Topic: Group Process: the first half of group was spent in process. Members shared about current issues and struggles in early recovery. One member was struggling with finances and another was worried about whether she was going to have a job. There was good disclosure and sharing among members and those struggling received good feedback and support. Also during this session, the results of previous drug screens were returned and one member couldn't explain why she had a positive for Suboxone. This generated some questioning and uncertainty. The member was clearly uncomfortable with the response, but other members encouraged her to be honest and no one would judge her. The session proved very compelling with members inviting honesty and support at the same time.  Group Time: 2:45- 4pm  Participation Level: Active  Behavioral Response: Sharing  Type of Therapy: Psycho-education Group  Topic: "Pro's and Con's of Using versus Sobriety": the second part of group was spent in a psycho-ed session. Members were challenged to identify the pro's and con's of using and the pro's and con's of sobriety. In each instance, members identified feelings as being the pro's or benefits of using as well as the con's or negatives for remaining sober. This finding emphasized the importance of feelings and learning to be more comfortable with them.    Summary: the patient reported he had attended meetings over the weekend. He expressed frustration that he felt so out of balance. He admitted he is feeling overwhelmed in everything he is doing and he is having a very difficult time handling it. The patient reported he is so moody and irritable. He pointed out that he can get mad at the blades of grass for being different lengths. The group laughed, but  others admitted they were equally out of sorts and extremely irritable. The patient stated he and his wife are getting closer and talking more than ever before. He admitted he isn't talking with many of his old friends because they drink, but he did note that no one is calling him to use because they have heard he is in recovery. The patient reported he would have to talk to his wife or some of the men he has met in the rooms if he begins to feel like he is falling. The patient has been using for about 15 of his 30 years and he admitted that he has to learn how to do almost everything over because he was almost always high or hung over or coming down and this is all new and very uncomfortable for him. He is very honest about his addiction and struggles and his sobriety date remains 6/5.    Family Program: Family present? No   Name of family member(s):   UDS collected: No Results:   AA/NA attended?: YesFriday, Saturday, and Sunday  Sponsor?: No   Giorgia Wahler, LCAS

## 2012-09-17 ENCOUNTER — Other Ambulatory Visit (HOSPITAL_COMMUNITY): Payer: 59 | Admitting: Psychology

## 2012-09-17 ENCOUNTER — Encounter (HOSPITAL_COMMUNITY): Payer: Self-pay | Admitting: Psychology

## 2012-09-17 DIAGNOSIS — F1020 Alcohol dependence, uncomplicated: Secondary | ICD-10-CM

## 2012-09-17 DIAGNOSIS — F1320 Sedative, hypnotic or anxiolytic dependence, uncomplicated: Secondary | ICD-10-CM

## 2012-09-17 DIAGNOSIS — F329 Major depressive disorder, single episode, unspecified: Secondary | ICD-10-CM

## 2012-09-17 NOTE — Progress Notes (Signed)
    Daily Group Progress Note  Program: CD-IOP   Group Time: 1-2:30 pm  Participation Level: Active  Behavioral Response: Sharing  Type of Therapy: Process Group  Topic: Process: the first part of group was spent in process. Members shared about their progress in early recovery and any obstacles or difficulties they have faced since the last group session. All of the members except the newest one had attended at least one meeting since the Wednesday group session. I emphasized the importance of behaviors in recovery and reminded the group that while they are sitting around, 'Chucky' is doing push-ups. There was good feedback and discussion among members. During this time, the medical director met with two new patients and three current patients that were inquiring about med changes or refills.  Group Time: 2:45-4pm  Participation Level: Active  Behavioral Response: Sharing  Type of Therapy: Psycho-education Group  Topic: Resentments, #2: the second half of group was spent completing the two-part presentation on resentments. The handout from Wednesday was reviewed and a discussion ensued about why resentments are so difficult. One member said it succinctly when he stated that by forgiving someone you are essentially, "letting them off the hook". I pointed out that is exactly what most people think and it is skewed thinking. Forgiveness is purely for the one forgiving and in no way does it lessen or negate the degree of damage done by the initial wrong. The threat that resentments play in early recovery was discussed and the importance of letting go of grudges is important to free one's self and promote continued emotional and spiritual growth. The session proved very compelling with good disclosure among members. The issues will need to be addressed again, since many of the members didn't seem to 'buy into' the explanation.    Summary: the patient reported he is doing well and had attended  2 meetings since the last group session. He is going to insure that his self-pity does not ruin this upcoming weekend for him. He made some good comments about forgiveness and provided good feedback. He continues to make excellent progress and his sobriety date remains 6/5  Family Program: Family present? No   Name of family member(s):   UDS collected: No Results:  AA/NA attended?: YesWednesday and Thursday  Sponsor?: No   Zamire Whitehurst, LCAS

## 2012-09-20 ENCOUNTER — Other Ambulatory Visit (HOSPITAL_COMMUNITY): Payer: 59

## 2012-09-21 ENCOUNTER — Encounter (HOSPITAL_COMMUNITY): Payer: Self-pay

## 2012-09-21 NOTE — Progress Notes (Unsigned)
Psychiatric Assessment Adult  Patient Identification:  Trevor Mendez Date of Evaluation:  09/03/2012 Chief Complaint: Need to establish intensive outpatient care for excessive alcohol consumption History of Chief Complaint:   Chief Complaint  Patient presents with  . Addiction Problem  . Alcohol Problem  . Drug / Alcohol Assessment  . Establish Care    HPI Trevor Mendez is a 32 year old married white male who presents to establish intensive outpatient care for his problem with alcohol. He was detoxed at [facility] adult inpatient unit from June 2 through June 5 of this year. He reports that he had been drinking 12-18 beers daily for the past 2 years. He was also prescribed Xanax 0.5 mg that he was taking 3-10 tablets daily for the past year. He also reports smoking marijuana about twice yearly. Trevor Mendez reports that he first began drinking around age 56, and began using Xanax in October of 2013. He has never sought treatment for his substance abuse problem in the past.  Review of Systems  Constitutional: Negative.   HENT: Negative.   Eyes: Negative.   Respiratory: Negative.   Cardiovascular: Negative.   Gastrointestinal: Negative.   Endocrine: Negative.   Genitourinary: Negative.   Musculoskeletal: Negative.   Allergic/Immunologic: Negative.   Neurological: Negative.   Hematological: Negative.    Physical Exam  Constitutional: He is oriented to person, place, and time. He appears well-developed and well-nourished.  HENT:  Head: Normocephalic and atraumatic.  Eyes: Conjunctivae are normal. Pupils are equal, round, and reactive to light.  Neck: Normal range of motion.  Musculoskeletal: Normal range of motion.  Neurological: He is alert and oriented to person, place, and time.    Depressive Symptoms: The patient is currently denying any symptoms of depression.  (Hypo) Manic Symptoms:   Elevated Mood:  No Irritable Mood:  No Grandiosity:  No Distractibility:   No Labiality of Mood:  No Delusions:  No Hallucinations:  No Impulsivity:  No Sexually Inappropriate Behavior:  No Financial Extravagance:  No Flight of Ideas:  No  Anxiety Symptoms: Excessive Worry:  Yes Panic Symptoms:  No Agoraphobia:  No Obsessive Compulsive: No  Symptoms: None, Specific Phobias:  No Social Anxiety:  Yes  Psychotic Symptoms:  Hallucinations: No None Delusions:  No Paranoia:  No   Ideas of Reference:  No  PTSD Symptoms: Ever had a traumatic exposure:  No  Traumatic Brain Injury: No   Past Psychiatric History: Diagnosis: Alcohol dependence, generalized anxiety disorder   Hospitalizations: [facility]. for detox June 2014   Outpatient Care:   Substance Abuse Care: Denies   Self-Mutilation: Denies   Suicidal Attempts: Denies   Violent Behaviors: Denies    Past Medical History:   Past Medical History  Diagnosis Date  . MIGRAINE HEADACHE 04/10/2007    Qualifier: Diagnosis of  By: Genelle Gather CMA, [place]    . DEPRESSION, MAJOR, SEVERE 09/13/2009    Qualifier: Diagnosis of  By: Debby Bud MD, Rosalyn Gess   . HTN (hypertension) 10/09/2011   History of Loss of Consciousness:  No Seizure History:  No Cardiac History:  No Allergies:   Allergies  Allergen Reactions  . Codeine Nausea And Vomiting   Current Medications:  Current Outpatient Prescriptions  Medication Sig Dispense Refill  . amLODipine (NORVASC) 5 MG tablet Take 1 tablet (5 mg total) by mouth daily.      Marland Kitchen FLUoxetine (PROZAC) 20 MG capsule TAKE ONE CAPSULE BY MOUTH TWICE A DAY  60 capsule  3  . hydrochlorothiazide (MICROZIDE) 12.5 MG  capsule Take 1 capsule (12.5 mg total) by mouth daily.      . naltrexone (DEPADE) 50 MG tablet Take 1 tablet (50 mg total) by mouth daily.  30 tablet  0  . traZODone (DESYREL) 50 MG tablet Take 1 tablet (50 mg total) by mouth at bedtime and may repeat dose one time if needed.  30 tablet  0   No current facility-administered medications for this visit.    Previous  Psychotropic Medications:  Medication Dose   Xanax   0.5 mg                      Substance Abuse History in the last 12 months: Substance Age of 1st Use Last Use Amount Specific Type  Nicotine      Alcohol  18  08/25/12  12 beers  Cannabis  19 04/28/12    marijuana   Opiates      Cocaine      Methamphetamines      LSD      Ecstasy      Benzodiazepines  31  08/25/12 4mg   Xanax  Caffeine      Inhalants      Others:                          Medical Consequences of Substance Abuse: None known  Legal Consequences of Substance Abuse: None  Family Consequences of Substance Abuse: Wife is concerned, has 62-month-old baby girl  Blackouts:  Yes DT's:  No Withdrawal Symptoms:  Yes Headaches Nausea Tremors Vomiting  Social History: Trevor Mendez was born and grew up in [place], [place]. He has a younger sister. His parents are still together and healthy. He graduated from high school, and has all but a degree at [employer] in Musician. 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 He has been married for 5 years, and has an 84-month-old daughter. He currently lives with his wife and daughter. 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 His social support system consists of his sponsor, wife, mother, father, sister, brother-in-law, and friends. He affiliates as a International aid/development worker. He denies any legal problems. He enjoys golf and firearms.  Family History:   Family History  Problem Relation Age of Onset  . Hypertension Mother   . Hypertension Father   . Alcohol abuse Father   . Alcohol abuse Paternal Aunt   . Alcohol abuse Paternal Uncle   . Alcohol abuse Paternal Grandfather   . Alcohol abuse Paternal Grandmother     Mental Status Examination/Evaluation: Objective:  Appearance: Casual  Eye Contact::  Good  Speech:  Clear and Coherent  Volume:  Normal  Mood:  Mildly anxious  Affect:  Congruent  Thought Process:  Linear   Orientation:  Full (Time, Place, and Person)  Thought Content:  WDL  Suicidal Thoughts:  No  Homicidal Thoughts:  No  Judgement:  Fair  Insight:  Fair  Psychomotor Activity:  Normal  Akathisia:  No  Handed:  Right  AIMS (if indicated):  0  Assets:  Communication Skills Desire for Improvement Financial Resources/Insurance Housing Intimacy Physical Health Resilience Social Support Transportation Vocational/Educational    Laboratory/X-Ray Psychological Evaluation(s)   Random urine drug screens     Assessment:    AXIS I Alcohol dependence, substance induced mood disorder, generalized anxiety disorder  AXIS II Deferred  AXIS III Past Medical History  Diagnosis  Date  . MIGRAINE HEADACHE 04/10/2007    Qualifier: Diagnosis of  By: Genelle Gather CMA, [place]    . DEPRESSION, MAJOR, SEVERE 09/13/2009    Qualifier: Diagnosis of  By: Debby Bud MD, Rosalyn Gess   . HTN (hypertension) 10/09/2011     AXIS IV problems related to social environment and problems with primary support group  AXIS V 41-50 serious symptoms   Treatment Plan/Recommendations:  Plan of Care: We will admit Trevor Mendez to the chemical dependency intensive outpatient program where he will attend sessions 3 days a week for 3 hours per session. He will be expected to attend at least 4 12-step meetings weekly in addition to attending the outpatient group therapy sessions. We will continue his medications per his discharge from his detox hospitalization, and make any appropriate adjustments.   Laboratory:  UDS  Psychotherapy: Attend groups  Medications: Prozac 20 mg daily, naltrexone 50 mg daily, trazodone 50 mg at bedtime  Routine PRN Medications:  No  Consultations: None  Safety Concerns:  Risk for relapse  Other:      [facility] 7/1/20149:43 AM

## 2012-09-22 ENCOUNTER — Other Ambulatory Visit (HOSPITAL_COMMUNITY): Payer: Managed Care, Other (non HMO) | Attending: Psychiatry

## 2012-09-22 DIAGNOSIS — F411 Generalized anxiety disorder: Secondary | ICD-10-CM | POA: Insufficient documentation

## 2012-09-22 DIAGNOSIS — F1020 Alcohol dependence, uncomplicated: Secondary | ICD-10-CM | POA: Insufficient documentation

## 2012-09-27 ENCOUNTER — Other Ambulatory Visit (HOSPITAL_COMMUNITY): Payer: Self-pay | Admitting: Physician Assistant

## 2012-09-27 ENCOUNTER — Other Ambulatory Visit (HOSPITAL_COMMUNITY): Payer: Managed Care, Other (non HMO)

## 2012-09-27 MED ORDER — TRAZODONE HCL 50 MG PO TABS: 50.0000 mg | ORAL_TABLET | Freq: Every evening | ORAL | Status: DC | PRN

## 2012-09-29 ENCOUNTER — Other Ambulatory Visit (HOSPITAL_COMMUNITY): Payer: Managed Care, Other (non HMO) | Admitting: Psychology

## 2012-09-29 DIAGNOSIS — F329 Major depressive disorder, single episode, unspecified: Secondary | ICD-10-CM

## 2012-09-29 DIAGNOSIS — F1020 Alcohol dependence, uncomplicated: Secondary | ICD-10-CM

## 2012-09-29 DIAGNOSIS — F1320 Sedative, hypnotic or anxiolytic dependence, uncomplicated: Secondary | ICD-10-CM

## 2012-09-30 ENCOUNTER — Encounter (HOSPITAL_COMMUNITY): Payer: Self-pay | Admitting: Psychology

## 2012-09-30 NOTE — Progress Notes (Signed)
    Daily Group Progress Note  Program: CD-IOP   Group Time: 1-2:30 pm  Participation Level: Minimal  Behavioral Response: Sharing  Type of Therapy: Psycho-education Group  Topic: Psycho-Education: first part of the group was spent with a visiting Chaplain. Donnelly Stager had come to speak on the topic of "Forgiveness". She shared thoughts about forgiveness and the concept of how forgiving frees [place] to move forward with our lives. A good discussion ensued with questions and comments from group members. The session proved very effective for all present.   Group Time: 2:45- 4pm  Participation Level: Active  Behavioral Response: Appropriate and Sharing  Type of Therapy: Process Group  Topic: Review of Recovery 101/Graduation Ceremony: After the break, the second half of group was spent reviewing some of the basics of recovery. These included identifying triggers, internal and external, devising strategies to address those that can't be eliminated, along with the importance of routine and structure. At the conclusion of the session, a graduation ceremony was held for a successfully graduating member. There were kind words of hope and fortitude mixed with a celebration with brownies. The session was very heartwarming and a fond farewell directed towards the graduating member.   Summary: The patient reported he had an appointment at his eye doctor, but would only be gone for less than an hour from the group. He left shortly after the chaplain arrived and missed most of the discussion. In the second half of group, the patient reported he is doing everything differently and is feeling out of sorts. He admitted the length of his discomfort is shorter, but he has continued to struggle with PAWS since he stopped drinking and using Xanax. The group applauded the news that he had picked up his 30 day chip at his home group meeting last night. He admitted he had never attained this much sobriety since he  was about 32 yo. The patient shared kind words with the graduating member and was very supportive and validating to her. He continues to make excellent progress, is attending AA meetings and has secured a sponsor, whom he phones every day. The patient's sobriety date remains 6/5.   Family Program: Family present? No   Name of family member(s):   UDS collected: No Results:  AA/NA attended?: YesTuesday  Sponsor?: Yes   Icyss Skog, LCAS

## 2012-10-01 ENCOUNTER — Other Ambulatory Visit (HOSPITAL_COMMUNITY): Payer: Managed Care, Other (non HMO) | Admitting: Psychology

## 2012-10-01 DIAGNOSIS — F1020 Alcohol dependence, uncomplicated: Secondary | ICD-10-CM

## 2012-10-01 DIAGNOSIS — F1320 Sedative, hypnotic or anxiolytic dependence, uncomplicated: Secondary | ICD-10-CM

## 2012-10-01 DIAGNOSIS — F329 Major depressive disorder, single episode, unspecified: Secondary | ICD-10-CM

## 2012-10-03 ENCOUNTER — Encounter (HOSPITAL_COMMUNITY): Payer: Self-pay | Admitting: Psychology

## 2012-10-03 NOTE — Progress Notes (Signed)
    Daily Group Progress Note  Program: CD-IOP   Group Time: 1-2:30 pm  Participation Level: Minimal  Behavioral Response: Appropriate and Sharing  Type of Therapy: Process Group  Topic: Group Process: The first half of group was spent in process. Members shared about what they have been doing to support their recovery. As is customary on Friday, the medical director was meeting with any new group members or others who needed med checks, follow-ups, or were reporting problems. I questioned one member and his previous reports in group after having spoken with his wife yesterday afternoon. He has been sleeping all day. The patient made many excuses and does not, in fact, appear to be doing much to embrace this new lifestyle of sobriety. Other members offered encouragement and provided examples of how they have addressed similar issues. The session proved instructional and provided a very clear example of how one must 'work at it' to make changes.   Group Time: 2:45- 4pm  Participation Level: Minimal  Behavioral Response: Sharing  Type of Therapy: Psycho-education Group  Topic: Psycho-Ed/Graduation: second half of group was spent in a brief psycho-ed session going over the handout entitled "90 Tools for Sobriety". The handout listed 90 different parts or elements of sobriety. As the session neared the end, a graduation ceremony was held to honor a member who was successfully completing the program today. There were brownies, kind words, and tears. As the session concluded, members shared what they intended to do over the weekend ahead to remain alcohol and drug-free.   Summary: The patient reported he continues to struggle with a stomach bug and doesn't feel very good. He provided good feedback to his fellow group member and encouraged him to get up and do something. The patient pointed out that his body feel badly, but it isn't going to help if he just lays there. The patient reported he had  his wife are going to go kayaking at [facility] tomorrow morning just to do something different. He provided kind and thoughtful words of hope and strength to the graduating member. The patient has done everything we have asked of him and remains sober with a sobriety date of 6/5.    Family Program: Family present? No   Name of family member(s):   UDS collected: No Results:  AA/NA attended?: YesThursday  Sponsor?: Yes   Kuba Shepherd, LCAS

## 2012-10-04 ENCOUNTER — Other Ambulatory Visit (HOSPITAL_COMMUNITY): Payer: Managed Care, Other (non HMO)

## 2012-10-04 ENCOUNTER — Other Ambulatory Visit: Payer: Self-pay | Admitting: Internal Medicine

## 2012-10-05 ENCOUNTER — Encounter (HOSPITAL_COMMUNITY): Payer: Self-pay | Admitting: Psychology

## 2012-10-05 ENCOUNTER — Telehealth (HOSPITAL_COMMUNITY): Payer: Self-pay | Admitting: Psychology

## 2012-10-05 NOTE — Progress Notes (Unsigned)
Patient ID: Trevor Mendez, male   DOB: 05-12-1980, 32 y.o.   MRN: [id number] CD-IOP: Individual Session. The patient appeared this morning at 9 am as scheduled. We had spoken late yesterday afternoon and he had expressed thoughts of hurting himself. At the time, he had denied any plan or intention, but had admitted this was entirely contrary to him and his personhood. This morning, when I asked about these thoughts, the patient admitted he had been having them over the past 4-5 days, but they had gotten worse over the last day or so and he had been too upset to attend group. The patient pointed out that this type of thinking is "not me" and admitted it was very upsetting. The patient reported, "the thoughts come from somewhere and it's another thing that I have to fight". He admitted he is battling thoughts of using 'all the time'. The patient reported that after group on Friday he had felt really good - about as calm as he ever feels - and he had walked into [employer] to get some chewing tobacco and some Goodies Powder (because he has been having headaches a lot lately) and he was suddenly overwhelmed with cravings for alcohol. The patient reminded me it was Friday at 5 pm and he always buys beer at that time of day and week.Marland KitchenHe reported he could barely speak and called his sponsor. His sponsor had laughed at his mumbling before he realized how his sponsee was struggling. The sponsor had finally been able to explain that this was part of recovery. The patient seems to have been overwhelmed with the realization that he was not in control. What I tried to help him reize is that he has never really been in control, but that his addictive mentality has persuaded him into thinking he had it all together. The patient reported he has never faced something so difficult in his entire life and he is overwhelmed. He also insisted, "I can't fail". The patient has done everything we have asked of him since first entering  the program - he has remained sober, attended AA meetings, secured a sponsor, and begun to open up in our group sessions. Today he admitted he wants to cry, but can't seem to make himself. He also admitted that crying is not his vision of a real man, but that is beginning to change and he is enjoying opening up and sharing his feelings in the group. We discussed his medications: the patient is prescribed Prozac and has been for 3 years - he was prescribed by his PCP. In November of last year, the patient was prescribed Xanax for panic attacks and anxiety, which he admittedly began abusing. When I asked  about the Trazadone that he had first received when he was upstairs in detox, the patient reported he had run out about 10 days ago and had not filled the script because he was sleeping well. Since then, he admitted he had not slept very well and his sleep was growing more restless by the day. I pointed out the lack of Trazadone, lack of sleep, and these destructive thoughts and he agreed they seemed to correlate. The patient has continued to take the Naltrexone to address alcohol cravings and has continued to as prescribed. He agreed to call in a prescription refill for Trazadone and agreed that sleep is very important. Our session ended with the patient assuring me he has no plans to hurt himself and will not hurt himself. He also  agreed that he would like to meet with the medical director tomorrow of Friday to discuss this further. The patient has made excellent progress to date and currently enjoys a 6/5 sobriety date - he picked up his 30 day chip last week. We will follow closely in the days ahead.

## 2012-10-06 ENCOUNTER — Other Ambulatory Visit (HOSPITAL_COMMUNITY): Payer: Managed Care, Other (non HMO) | Admitting: Psychology

## 2012-10-06 ENCOUNTER — Encounter (HOSPITAL_COMMUNITY): Payer: Self-pay

## 2012-10-06 NOTE — Progress Notes (Unsigned)
    Daily Group Progress Note  Program: CD-IOP   Group Time: 1pm-2:30 pm   Participation Level: Active  Behavioral Response: Appropriate and Sharing  Type of Therapy: Process Group  Topic: Group time was spent processing the weekend and how everyone was doing with their sobriety. This patient shared some about the meetings he attended and what he did with his time. He was open with other group members about their recovery and responded well to feedback.      Group Time: 2:30-4pm  Participation Level: Active  Behavioral Response: Appropriate and Sharing  Type of Therapy: Education and Training Group  Topic: speaker   Summary: A previous group member spoke to the group about his 16 months of sobriety and what it has been like for him. A lot of time was spent discussing sponsorship in AA and the importance of working an active program. This patient participated in that discussion and is attending meetings daily. He responded appropriately to questions and feedback as well.    Family Program: Family present? No   Name of family member(s): 0  UDS collected: No Results: negative  AA/NA attended?: YesFriday, Saturday and Sunday  Sponsor?: Yes   [facility]

## 2012-10-07 ENCOUNTER — Encounter (HOSPITAL_COMMUNITY): Payer: Self-pay | Admitting: Psychology

## 2012-10-07 ENCOUNTER — Encounter (HOSPITAL_COMMUNITY): Payer: Self-pay

## 2012-10-07 NOTE — Progress Notes (Signed)
Daily Group Progress Note  Program: CD-IOP   Group Time: 1-2:30 pm  Participation Level: Active  Behavioral Response: Sharing  Type of Therapy: Process Group  Topic: Group process: the first half of group was spent in process. Members shared about their current issues and concerns in early recovery. One member was very quiet and a fellow member asked about this. The member admitted that he was miserable and had begun questioning why he was choosing sobriety if it was so miserable. This disclosure proved to be very surprising to most of the group because the disclosing member has been the group "clown" since entering the program. To hear that he had been thinking about actually walking out of the session, questioning his drinking, or actually having thoughts of hurting himself, was a shock to everyone and there was silence as his feelings were absorbed. The remainder of the session was spent discussing the challenges and actual pain of early recovery and the difficulties that might be expected. Members shared how they felt about this member and the caring and love they had come to feel towards him. They also expressed frustration that they couldn't really help him feel better or, in some way, ease his pain. The session was not a step-back for the group, but a recognition that early recovery isn't always fun, might be painful, and could lead one back to choosing their addiction over sobriety. It proved to be a 'sobering' session.  Group Time: 2:45- 4pm  Participation Level: Minimal  Behavioral Response: Sharing  Type of Therapy: Psycho-education Group  Topic: Identifying Values/Graduation: the second half of group was spent in a psycho-ed piece on Values. Included in the presentation was a handout which identified many different values and asked members to identify 3 things they can do to demonstrate those values. One member identified "Dependability" as a value she wanted to live out on a  daily basis and identified behaviors/actions she would carry out to demonstrate that value. At the conclusion of the session, a graduation ceremony was held for a member who was leaving having successfully completed the program. There were tears and kind words expressed to this woman who has been such a powerful presence in this program.    Summary: this patient was the one who admitted in the first part of group that he was feeling awful and hopeless and wondering whether sobriety was worth it if he was going to feel so horrible. He admitted that he had had thoughts of hurting himself and these were so contrary to him and his personhood that he was frightened. The patient reported he doesn't know what is happening or why he is feeling this way. He admitted that the Trazadone he had been prescribed had been very helpful, but he stopped taking it because he was sleeping well. I noted to all present that Trazadone is not only a sleep aid, but it is an anti-depressant and should not be halted without consult with the individual who prescribed this medication. The lack of it may be contributing to his growing depression and sleeplessness. The patient's disclosure took his fellow group members by surprise and they were saddened by his degree of pain and struggle. They all encouraged him not to give up and asked that he take their calls when they came. Some noted they had also felt hopeless and it had passed. The patient admitted he felt better having just shared what he had been feeling. He gave the graduating member accolades for  her strength and perseverance despite the struggles she has faced in the past few weeks. The patient seemed to have benefitted by his disclosure, but it is unclear whether he was just trying to make others feel better and not worry about him or he was being honest about what he is going through. I will make sure he sees the medical director on Friday/ his sobriety date remains  6/5.   Family Program: Family present? No   Name of family member(s):   UDS collected: Yes Results:not back yet  AA/NA attended?: YesTuesday  Sponsor?: Yes   Genella Bas, LCAS

## 2012-10-07 NOTE — Progress Notes (Unsigned)
    Daily Group Progress Note  Program: CD-IOP   Group Time: 1pm-2:30pm  Participation Level: Active  Behavioral Response: Appropriate and Sharing  Type of Therapy: Process Group  Topic: Group time was spent discussing the upcoming 4th of July holiday. Members wrote out their plan for the day and shared it with the group. This member has a solid plan in place and is utilizing a lot of support in his recovery. He recently joined a home group and is attending 12-step meetings daily. He was active and had good insight in group. He gives and receives feedback well also.      Group Time: 2:30pm-4pm  Participation Level: Active  Behavioral Response: Appropriate and Sharing  Type of Therapy: Education and Training Group  Topic: Pharmacist-Medication education   Summary: [facility] pharmacist come and spoke to the group about medication and recovery. She discussed types of helpful medications as well as clearly defining those which should not be used in recovery. She answered extensive questions about pain management, depression, anxiety, cravings and ADD. She was helpful in delineating between addictive and abusable meds vs medications which are more safe and helpful to take. This patient was attentive and appropriate. He did not have any direct questions for the pharmacist.    Family Program: Family present? No   Name of family member(s): 0  UDS collected: No Results: negative  AA/NA attended?: YesMonday and Tuesday  Sponsor?: Yes   [facility]

## 2012-10-08 ENCOUNTER — Other Ambulatory Visit (HOSPITAL_COMMUNITY): Payer: Self-pay | Admitting: Physician Assistant

## 2012-10-08 ENCOUNTER — Other Ambulatory Visit (HOSPITAL_COMMUNITY): Payer: Managed Care, Other (non HMO) | Admitting: Psychology

## 2012-10-08 DIAGNOSIS — F1320 Sedative, hypnotic or anxiolytic dependence, uncomplicated: Secondary | ICD-10-CM

## 2012-10-08 DIAGNOSIS — F329 Major depressive disorder, single episode, unspecified: Secondary | ICD-10-CM

## 2012-10-08 DIAGNOSIS — F1020 Alcohol dependence, uncomplicated: Secondary | ICD-10-CM

## 2012-10-08 MED ORDER — ARIPIPRAZOLE 5 MG PO TABS: 5.0000 mg | ORAL_TABLET | Freq: Every day | ORAL | Status: DC

## 2012-10-08 MED ORDER — NALTREXONE HCL 50 MG PO TABS: 50.0000 mg | ORAL_TABLET | Freq: Every day | ORAL | Status: DC

## 2012-10-08 NOTE — Telephone Encounter (Signed)
  Subjective: Trevor Mendez reports that he has been struggling recently and has been having passive suicidal thoughts all this week. He is feeling depressed and irritable. He has a desire to isolate, and has feelings of worthlessness and hopelessness as well as anhedonia. He feels very unmotivated in his energy is poor. He also reports that he is having ruminating thoughts and he has been catastrophize in somewhat. He denies any homicidal ideation or auditory or visual hallucinations. He stopped taking the trazodone because he finds that it makes it hard for him to get up. He has started taking again 2 days ago. He continues to take Prozac 20 mg twice daily, a medication he has been on for the past 3 years. He reports that he has noticed a cyclical pattern to his depression, and he typically gets depressed during the months of June and July.  Objective: Well-nourished well-developed white male who is fully alert and oriented and in no acute distress. Depressed mood and constricted affect. Speech is clear and coherent with a regular rate and rhythm. Cognitive functioning is within normal limits. Insight and judgment are impaired.  Assessment and Plan: We'll prescribe the Abilify, initially 2.5 mg daily, and increase to 5 mg if significant improvement is not seen after 6 days.

## 2012-10-09 ENCOUNTER — Encounter (HOSPITAL_COMMUNITY): Payer: Self-pay | Admitting: Psychology

## 2012-10-09 NOTE — Progress Notes (Signed)
    Daily Group Progress Note  Program: CD-IOP   Group Time: 1-2:45 pm  Participation Level: Active  Behavioral Response: Appropriate and Sharing  Type of Therapy: Process Group  Topic: Group Process: the first part of group was spent in process. One member had experienced a terrible loss with the news that her boyfriend had overdosed and died yesterday. The patient had appeared for the session accompanied by her mother and sister. The group members offered their condolences and shared their ways of having dealt with pain and loss in the past. They applauded and honored her willingness to be here in group today.  I presented a very brief reminder on the autonomy we all have when making choices and emphasized that no one is responsible for any one else's behavior. The patient received good feedback and support and her pain was shouldered as much as possible by her fellow group members. Prior to the break, a clip was shown from the DVD, "Drunk". It included a brief monologue from a woman in an AA meeting and the way her addiction had affected her son. It was very powerful and brought some group members to tears.   Group Time: 3-4 pm  Participation Level: minimal   Behavioral Response: Sharing  Type of Therapy: Psycho-education Group  Topic: Values, Part 2: the second half of group was spent in a psycho-education session on the importance of identifying values in early recovery as well as behaviors that demonstrate those values. During this time, the medical director met with a number of patients for medication checks. He also met with the grieving patient and her family to address anxiety and issues he may be able to assist her with during the period. As the conclusion of the session neared, members shared about their recovery-related plans for the weekend.    Summary: the patient appeared more energized and in a better mood than the last group session. He offered sympathetic words and  provided support for the suffering group member. He applauded her willingness to be here today. He talked about his struggles with addiction and how he carries on the struggle in his mind. He met with the medical director after the break. Prior to the session ending, the patient reported he would attend a meeting tonight, but was unwilling to commit to the [facility] meeting tomorrow morning. At least 3 other male members of the group will be at the 9:30 meeting. The patient's sobriety date remains 6/5.    Family Program: Family present? No   Name of family member(s):   UDS collected: No Results:  AA/NA attended?: YesThursday  Sponsor?: Yes   Hazely Sealey, LCAS

## 2012-10-11 ENCOUNTER — Other Ambulatory Visit (HOSPITAL_COMMUNITY): Payer: Managed Care, Other (non HMO)

## 2012-10-13 ENCOUNTER — Other Ambulatory Visit (HOSPITAL_COMMUNITY): Payer: Managed Care, Other (non HMO) | Admitting: Psychology

## 2012-10-13 DIAGNOSIS — F1020 Alcohol dependence, uncomplicated: Secondary | ICD-10-CM

## 2012-10-13 DIAGNOSIS — F1320 Sedative, hypnotic or anxiolytic dependence, uncomplicated: Secondary | ICD-10-CM

## 2012-10-13 DIAGNOSIS — F329 Major depressive disorder, single episode, unspecified: Secondary | ICD-10-CM

## 2012-10-14 ENCOUNTER — Encounter (HOSPITAL_COMMUNITY): Payer: Self-pay | Admitting: Psychology

## 2012-10-14 NOTE — Progress Notes (Signed)
    Daily Group Progress Note  Program: CD-IOP   Group Time: 1-2:30 pm  Participation Level: Active  Behavioral Response: Appropriate and Sharing  Type of Therapy: Psycho-education Group  Topic:  Check-in and Assertive Communication: the first part of group was spent in a psycho-ed session. After checking-in and sharing about current issues, handouts were provided on the different types of communication. The remainder of the session was spent identifying and discussing the different types of communication. A number of members shared about their early life and lack of communication and expediencies that resulted from those unhealthy ways of relating. The session proved enlightening and informative.   Group Time: 2:45- 4pm Participation Level: Minimal  Behavioral Response: Sharing  Type of Therapy: Education and Training Group  Topic: The second part of the session was spent in a psycho-ed piece with Eloisa Northern, a therapist here at  [facility]. She provided education on heart math, a practice that teaches patients to learn how to control heart arte and deal with more effectively with stress. After a brief introduction, group members volunteered to "hook up" and go through the heart math exercise. Out of 4 group members, all were very stressed out at the beginning of the exercise, but everyone of them experienced and displayed a marked reduction in stress as evidenced by their heart rate. The session was informative and proved very surprising and compelling to group members. The session proved very effective.   Summary: The patient reported he was feeling better and has been for the past few days. He reported he had attended a meeting since the last group. He was active and engaged in the discussion on assertiveness and communication. He was raised in a family that didn't talk and so communication has not been something he was very good at. The patient admitted he and his wife are talking a  lot more since he stopped drinking. The patient was suspect of the [facility] when it was first demonstrated, but then he grew speechless as he watched the screens and saw the changes in their heart beats just by focusing on one's breath. He was not able to be tested himself, but he will be given the opportunity in Friday's group session. The patient appears to be more of his old self and his sobriety date remains 6/5.   Family Program: Family present? No   Name of family member(s):   UDS collected: No Results:  AA/NA attended?: YesTuesday  Sponsor?: Yes   Carolanne Mercier, LCAS

## 2012-10-15 ENCOUNTER — Other Ambulatory Visit (HOSPITAL_COMMUNITY): Payer: Managed Care, Other (non HMO) | Admitting: Psychology

## 2012-10-15 DIAGNOSIS — F1020 Alcohol dependence, uncomplicated: Secondary | ICD-10-CM

## 2012-10-15 DIAGNOSIS — F1320 Sedative, hypnotic or anxiolytic dependence, uncomplicated: Secondary | ICD-10-CM

## 2012-10-15 DIAGNOSIS — F329 Major depressive disorder, single episode, unspecified: Secondary | ICD-10-CM

## 2012-10-15 NOTE — Progress Notes (Signed)
Patient ID: Trevor Mendez, male   DOB: 11-24-1980, 32 y.o.   MRN: [id number]  Subjective: Met briefly with Trevor Mendez to followup on his response to the Abilify prescribed last week. He has been taking the 2.5 mg dose and feels that it has helped significantly. He reports that his dark thoughts have diminished significantly. He is much less irritable. He has regained his motivation. His drains are no longer disturbing. He feels that he is 80% improved.  Objective: Well-nourished well-developed white male who is fully alert and oriented and in no acute distress. Mildly anxious but otherwise euthymic mood, with a congruent affect. Eye contact is good. Speech is clear and coherent add a regular rate and rhythm and normal volume. Insight and judgment are fair. Cognitive functioning is within normal limits.  Assessment and Plan: Continue Abilify 2.5 mg daily.

## 2012-10-18 ENCOUNTER — Other Ambulatory Visit (HOSPITAL_COMMUNITY): Payer: Managed Care, Other (non HMO) | Admitting: Psychology

## 2012-10-18 ENCOUNTER — Encounter (HOSPITAL_COMMUNITY): Payer: Self-pay | Admitting: Psychology

## 2012-10-18 DIAGNOSIS — F1320 Sedative, hypnotic or anxiolytic dependence, uncomplicated: Secondary | ICD-10-CM

## 2012-10-18 DIAGNOSIS — F1020 Alcohol dependence, uncomplicated: Secondary | ICD-10-CM

## 2012-10-18 DIAGNOSIS — F329 Major depressive disorder, single episode, unspecified: Secondary | ICD-10-CM

## 2012-10-18 NOTE — Progress Notes (Signed)
    Daily Group Progress Note  Program: CD-IOP   Group Time: 1-2:30 pm  Participation Level: Active  Behavioral Response: Appropriate and Sharing  Type of Therapy: Process Group  Topic: Group Process: the first part of group was spent in process. Members shared their current issues and concerns. There was a new group member present today and he voluntarily shared a little bit about himself and his reasons for being here. During this part of group, the medical director was meeting with current members or medication checks as well as new members and their initial session. One member shared about anger and frustration about the behavior of some in NA while another shared about her dilemma that occurred yesterday while she was walking. It proved to be a good session with open and honest disclosure and opinions about ways to handle one's self in everyday life.  Group Time: 2:45- 4pm  Participation Leve: Active  Behavioral Response: Appropriate  Type of Therapy: Education and Training Group  Topic: HeartMath: the second part of group was spent in an Multimedia programmer. Members who had not participated in the activity on Wednesday were "hooked up" today. During this session, the medical director met with another  member who had missed the previous two Friday sessions. The new group member struggled getting into the 'green', but all other members were able to move into the green entirely, and/or avoid the red altogether. This activity demonstrated the power of breath in emotional balance and well-being. The session ended with a graduation. The member graduating had been a powerful and important part of the group and there were powerful emotions expressed as we passed the 'coin' around. She expressed her appreciation to the group and how important this time had been for her. "It save my life", she stated, and the group applauded her commitment to recovery.    Summary: The  patient reported he was feeling much better. He noted that he had spoken with his sponsor and has decided he should attend different meetings and experience new people. He admitted he had probably stuck with the same meeting too much. He was called out to meet the medical director and returned about 20 minutes later. The patient was very curious and upset about the unpleasant experience disclosed by his fellow group member. Her anger and frustration with a woman making rude and profane comments to her was clearly something he had experienced and he wanted to know how to respond? The patient did very well when he was hooked up to the [facility] computer and he was applauded. I encouraged him to practice this. The patient made some heartfelt comments to the graduating member and he continues to make excellent progress in his recovery. His sobriety date remains 6/5.   Family Program: Family present? No   Name of family member(s):   UDS collected: No Results:  AA/NA attended?: YesThursday  Sponsor?: Yes   Trevor Mendez, LCAS

## 2012-10-19 ENCOUNTER — Encounter (HOSPITAL_COMMUNITY): Payer: Self-pay | Admitting: Psychology

## 2012-10-19 NOTE — Progress Notes (Signed)
    Daily Group Progress Note  Program: CD-IOP   Group Time: 1-2:30 pm  Participation Level: Active  Behavioral Response: Appropriate and Sharing  Type of Therapy: Process Group  Topic: Group process; the first part of group was spent in process. Members shared about the past weekend and what they had done to support their recovery. The newest member admitted that he had drunk on Saturday night. He admitted that it was his wife's 75th birthday party and they had a big crowd at the house and "everyone was having a good time". He admitted he had snuck a couple of drinks, but his wife hadn't seen him and he had not told her. The group processed the 'slip' and discussed possible ways he could have addressed the temptation and avoided drinking. there was good feedback and discussion and the member who had relapsed, reported he realized that recovery involved making plans ahead of time and preparing for different scenarios. The session proved very insightful to all.  Group Time: 2:45- 4pm  Participation Level: Active  Behavioral Response: Sharing  Type of Therapy: Psycho-education Group  Topic: Dreams/Sleep Hygiene/Graduation: the second half of group was spent in a psycho-ed piece on sleep. Included were handouts about the different stages of sleep and the purpose or ways in which each stage benefits the individual. The handout on sleep hygiene identified the importance of preparing for sleep so when one does lie down, they go to sleep. While many of the group members raised their hands when asked about any sleep disorders, it appears that only a few have ever practiced a specific routine in preparation for sleep. Present today was a Consulting civil engineer from the [employer]. She engaged in the discussion and offered her own perspectives when appropriate. The graduation ceremony was held and the group bid farewell to a very powerful and important member of the group. They expressed appreciation  for his guidance and support and the ceremony brought tears to the member successfully leaving the program.   Summary: The patient reported he had had a good weekend. 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 Both he and another member agreed that hot summer weekends are big triggers because that is when they used the most. The patient did report, when questioned, that he is feeling much better now and not experiencing that sense of hopelessness he had disclosed two weeks ago. The patient made some good comments about the recent relapse and agreed that one must make plans ahead of time when they enter challenging settings or events. The patient had words of gratitude and praise for the graduating member. They have spoken on the phone numerous times and gotten to know each other well. [name] assured him they would remain connected and continue to see each other in meetings. The patient continues to make good progress and his sobriety date is 6/5.    Family Program: Family present? No   Name of family member(s):   UDS collected: No Results:   AA/NA attended?: YesFriday and Sunday  Sponsor?: Yes   Trinity Hyland, LCAS

## 2012-10-19 NOTE — Progress Notes (Unsigned)
Patient ID: Trevor Mendez, male   DOB: 1980-11-02, 32 y.o.   MRN: [id number] CD-IOP: Treatment Plan Update. The patient met with me as scheduled for his weekly individual session and treatment plan review. He reported he is feeling much better now. He is no longer hearing any negative voices and is now focused on making money. "It is like I used to be", he explained. He reported he is preparing to return to work, but feels much better about it because he now has tools to deal with the stress and frustrations he used the alcohol and Xanax for. The patient also reported he has a contingency plan in case it proves to be too much. He explained he will ask to be assigned to the floor. This is more hands on and will reduce the expectations and responsibilities he currently has if he recognizes it is too unhealthy for him. The patient reported he did not run this morning and stated, "I missed it". He explained that he has run in the early mornings over the past week and is finding it is a really good way to begin his day and he seems to have more energy at the end of the day. That and playing basketball on Sunday afternoons have got him moving and 'playing' in ways he had not done in many years. The patient reported he is going by his grandmother's home now and they play scrabble. He finds it still confusing and frustrating that he can't find the words he once knew. I assured him his memory and increased clarity will return as he attains more sobriety. The patient noted that he has seen his maternal grandmother more often in the past 3 weeks than he had in the past 6 years. I pointed out how the alcohol had taken him away from everyone he loved. He agreed and admitted that he hadn't even realized it was happening at the time. In reviewing his treatment goals, the patient has made excellent progress in his recovery and remains alcohol and drug-free with a sobriety date of 6/5. He has immersed himself in 12-step  meetings and has a sponsor. His third treatment goal relates to his hypertension and eventually getting off his medications entirely. We laughed because I had not remembered that was a goal of his and he certainly didn't remember it since he had just gotten out of detox. The patient reported he has one concern coming up in September. He mentioned a bachelor party scheduled for a fishing trip out at [employer]. He has a plan that he worked out with his sponsor, but plans to speak with his cousin who is getting married, and explain to Lyman why he needs to limit his time around the drinking and partying. The patient has done extremely well in his recovery and done everything we have asked of him. He will remain for another two weeks and then graduate and return to his work place full time. I have provided him some recommendations to consider for his medication management and will refer him to a male therapist upon discharge from our program. The documentation was reviewed, signed, and the update completed accordingly. We will continue to follow closely in the days ahead.

## 2012-10-20 ENCOUNTER — Other Ambulatory Visit (HOSPITAL_COMMUNITY): Payer: Managed Care, Other (non HMO) | Admitting: Psychology

## 2012-10-20 DIAGNOSIS — F1020 Alcohol dependence, uncomplicated: Secondary | ICD-10-CM

## 2012-10-20 DIAGNOSIS — F329 Major depressive disorder, single episode, unspecified: Secondary | ICD-10-CM

## 2012-10-20 DIAGNOSIS — F1320 Sedative, hypnotic or anxiolytic dependence, uncomplicated: Secondary | ICD-10-CM

## 2012-10-21 ENCOUNTER — Encounter (HOSPITAL_COMMUNITY): Payer: Self-pay | Admitting: Psychology

## 2012-10-21 NOTE — Progress Notes (Signed)
    Daily Group Progress Note  Program: CD-IOP   Group Time: 1-2:30 pm  Participation Level: Minimal  Behavioral Response: Appropriate and Sharing  Type of Therapy: Education and Training Group  Topic: Guest Speaker: the first part of group was spent with a group speaker. Oran Rein, a dietician with [facility], appeared before the group to discuss diet. She emphasized the importance of a good diet and the benefits that come through healthy eating. She also identified the many problems that can occur when one does not eat or eats unhealthy foods lacking good nutritional benefits. There was good feedback and questions from group members and the session proved very informative for all.   Group Time: 2:45- 4pm  Participation Level: Minimal  Behavioral Response: Sharing  Type of Therapy: Process  Topic: Group Process: The second half of group was spent in process. There were 3 new group members present today and 2 shared briefly about themselves. There were questions, particularly about the new member who is a gambling addict. I explained that there are different types of addictions. Some entail ingesting something, like alcohol, food, and drugs while some are "process" addictions, like gambling, workaholism, or shopping. The session proved very educational and brought the members closer toge4ther. Prior to ending the session, members were reminded of the cook out scheduled for Friday and members agreed they would bring the side dishes as they had offered.    Summary: The patient was attentive and made some funny comments during the presentation by the dietician. He has always eaten a lot of pork and had a lot of sausage this morning. He did concur with the dietician about meat that is fed differently and noted that his meat looks different than traditionally raised beef. The patient admitted he is never hungry and would frequently not eat if his wife didn't pester him about it. "I don't  have an appetite", he stated. She provided some ideas and suggested that perhaps, as he gains more sobriety, he will develop more of an appetite. The patient reported he was doing better and has begun running. He finds the early mornings very inspiring and beautiful. The patient provided some good feedback and responded well to this intervention. He is making good progress and remains sober with a sobriety date of 6/5.   Family Program: Family present? No   Name of family member(s):   UDS collected: No Results:  AA/NA attended?: YesTuesday  Sponsor?: Yes   Torria Fromer, LCAS

## 2012-10-22 ENCOUNTER — Other Ambulatory Visit (HOSPITAL_COMMUNITY): Payer: Managed Care, Other (non HMO) | Attending: Psychiatry | Admitting: Psychology

## 2012-10-22 DIAGNOSIS — F1020 Alcohol dependence, uncomplicated: Secondary | ICD-10-CM | POA: Diagnosis not present

## 2012-10-22 DIAGNOSIS — F411 Generalized anxiety disorder: Secondary | ICD-10-CM | POA: Insufficient documentation

## 2012-10-22 DIAGNOSIS — F329 Major depressive disorder, single episode, unspecified: Secondary | ICD-10-CM

## 2012-10-22 DIAGNOSIS — F1320 Sedative, hypnotic or anxiolytic dependence, uncomplicated: Secondary | ICD-10-CM

## 2012-10-24 ENCOUNTER — Encounter (HOSPITAL_COMMUNITY): Payer: Self-pay | Admitting: Psychology

## 2012-10-24 NOTE — Progress Notes (Signed)
    Daily Group Progress Note  Program: CD-IOP   Group Time: 1-2:30 pm  Participation Level: Active  Behavioral Response: Appropriate and Sharing  Type of Therapy: Activity Group  Topic: Cook Out: the first part of group was spent eating lunch together. The medical director had grilled the hamburgers, veggie burgers, and hot dogs. Group members brought sides and everyone sat down together and talked while we ate. The member who was to graduate today was not present and had sent a text to a number of members reporting he had hurt his knee on the job and was going to the hospital.  Two other members were also absent. The session seemed to bring everyone a little closer and everyone agreed the food was great.   Group Time: 2:45-4 pm  Participation Level: Active  Behavioral Response: Appropriate and Sharing  Type of Therapy: Process Group  Topic: Group Process: the second part of group was spent in process. One member disclosed that his birthday was this Sunday and he was terrified that he would drink. This would be his first sober birthday since he was 32 yo. This generated lots of ideas and suggestions about how to prepare and what he plans to do to insure that he does remain alcohol and drug-free. The session ended with members sharing about the upcoming weekend and what their plans were. It was unpleasantly surprising to hear that a number of group members did not yet have plans despite it being almost 4 pm on Friday. The importance of making plans and commitments was discussed at length.   Summary: The patient seemed to enjoy the luncheon. He showed the group pictures of his 50 month old daughter on his phone. I noted that he had informed me she could recite the alphabet and count to 11. Members applauded this news. In process, this member shared about his upcoming birthday on Sunday. He admitted having had dreams recently where he dreamed he was drinking and he swore he could taste the  beer. The patient was reminded the dreams are okay and they don't mean anything and he certainly doesn't have to act on dreams. He shared his weekend plans and the biggest concern is actually on SAurday. His wife is working all day and he was planning on playing golf with his nephew. The problem is it is supposed to rain all day and he was thinking he would be safe on the golf course. If he doesn't have plans over the afternoon in case of rain, he admitted it would be very difficult. The group provided good feedback and recommendations and we reviewed various scenarios. The patient admitted he felt much better having shared his fears and concerns and thanked the group for their support. The patient was applauded again for opening up because he has not been one to do this in the past. The importance of being open and talking about issues and concerns was emphasized. He continues to make excellent progress in his recovery and his sobriety date remains 6/5.    Family Program: Family present? No   Name of family member(s):   UDS collected: Yes Results: not back from lab  AA/NA attended?: YesThursday  Sponsor?: Yes   Aairah Negrette, LCAS

## 2012-10-25 ENCOUNTER — Other Ambulatory Visit (HOSPITAL_COMMUNITY): Payer: Managed Care, Other (non HMO) | Admitting: Psychology

## 2012-10-25 DIAGNOSIS — F1320 Sedative, hypnotic or anxiolytic dependence, uncomplicated: Secondary | ICD-10-CM

## 2012-10-25 DIAGNOSIS — F1020 Alcohol dependence, uncomplicated: Secondary | ICD-10-CM

## 2012-10-25 DIAGNOSIS — F329 Major depressive disorder, single episode, unspecified: Secondary | ICD-10-CM

## 2012-10-26 ENCOUNTER — Encounter (HOSPITAL_COMMUNITY): Payer: Self-pay | Admitting: Psychology

## 2012-10-26 NOTE — Progress Notes (Signed)
    Daily Group Progress Note  Program: CD-IOP   Group Time: 1-2:30 pm  Participation Level: Active  Behavioral Response: Appropriate and Sharing  Type of Therapy: Psycho-education Group  Topic: Group Process/Mind/Body/Spirit: the group began with check-in from the weekend. Members shared what they had done to support their recovery. Also discussed were obstacles or problems that may have appeared since we last met. A handout was provided to all group members titled, "Balance of Mind, Body, and Spirit". Members were asked to identify what they do to balance and feed each part of themselves. Each member came up and wrote down what they do in each area. The importance of finding balance was emphasized.   Group Time: 2:45- 4pm  Participation Level: Active  Behavioral Response: Sharing  Type of Therapy: Activity Group  Topic: Family Sculpture: the second half of group was spent in an activity called "Family Sculpture". Members were asked to sculpt their 'biological family'. this included using other group members to represent each of their family members in a scene reflective of their family life when they were children. The first sculpture proved very interesting and painful as the male member sculpted a critical, argumentative, scene with her family. There was good discussion and feedback among group members.    Summary: the patient reported he had gotten through the weekend successfully and celebrated his birthday on Sunday without drinking. He reported he had decided not to play golf with his nephew on Saturday because the other players were big drinkers and he had a history with them. He had gone out with some other friends who didn't drink and really enjoyed himself. The patient expressed frustration about meditation and didn't understand how to do it? I explained a little about that and noted that watching the ball in [employer] is a type of meditation. He was able to better grasp it  after I noted this exercise which, by the way, he had done quite well. The patient identified reading, spending time with his 57 month-old daughter, Kara Mead, and running as ways he enriches his body, mind, and spirit. In the sculpture, he reported he found it very interesting and wondered what his would look like. I encouraged this patinet and other group members to think about their sculptures because we would be continuing them in the next session. The patient continues to make excellent progress with a sobriety date of 6/5.    Family Program: Family present? No   Name of family member(s):   UDS collected: No Results: but the one collected on Friday was negative  AA/NA attended?: YesFriday and Sunday  Sponsor?: Yes   Jilene Spohr, LCAS

## 2012-10-26 NOTE — Progress Notes (Unsigned)
Patient ID: Trevor Mendez, male   DOB: 24-Sep-1980, 32 y.o.   MRN: [id number] CD-IOP: Family Session. The patient and his wife, Trevor Mendez, appeared this morning for a session. He had wanted her to come to share about her feelings and discuss how he was doing. The patient reported he had struggled with feelings last night. After a good group session, he had come home and sat down and although nothing bad had happened and it had been a good day, he was 'scared'. He admitted he didn't understand why he felt this way and he was tempted to get up and get busy outside, but recognized he should just sit with it. I congratulated him on choosing to stay with it and validated that this had not been easy since in his past, he would have had a beer. The patient admitted, "I don't know what normal feels like". He admitted what is probably normal is 'very uncomfortable" for him. I concurred with this recognition and explained that this is why recovery and staying sober is so difficult - one has to learn to be comfortable with feelings that are frequently uncomfortable. The remainder of the session was spent discussion things he can anticipate in early recovery and allowing Trevor Mendez to share her feelings and what she has been going through. I emphasized the importance of Al-Anon and gave them a schedule of meetings, including an AA and Al-Anon meeting at 8 pm on Thursdays in [place]. We also discussed when he is prepared to return to work. He admitted that after telling me he was ready to return next week, he is not sure and feels as if it might be overwhelming. I reminded him he had complained about their finances, but his wife quickly noted that their money situation is just fine. He admitted he had been "bullshitting". The patient agreed to speak with his wife and look at a calendar. He could possibly consider going back part-time and still be in the program so he can vent and share frustrations about work. 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 The session was well and proved effective for this couple.

## 2012-10-27 ENCOUNTER — Other Ambulatory Visit (HOSPITAL_COMMUNITY): Payer: Managed Care, Other (non HMO) | Admitting: Psychology

## 2012-10-27 DIAGNOSIS — F1020 Alcohol dependence, uncomplicated: Secondary | ICD-10-CM

## 2012-10-27 DIAGNOSIS — F1320 Sedative, hypnotic or anxiolytic dependence, uncomplicated: Secondary | ICD-10-CM

## 2012-10-27 DIAGNOSIS — F329 Major depressive disorder, single episode, unspecified: Secondary | ICD-10-CM

## 2012-10-28 ENCOUNTER — Encounter (HOSPITAL_COMMUNITY): Payer: Self-pay | Admitting: Psychology

## 2012-10-28 NOTE — Progress Notes (Signed)
    Daily Group Progress Note  Program: CD-IOP   Group Time: 1-2:30 pm  Participation Level: Minimum  Behavioral Response: Appropriate and Sharing  Type of Therapy: Psycho-education Group  Topic: Guest Speaker: the first part of group was spent with a guest speaker. This woman was a former [employer] of this program and had attained 18 months of sobriety. She talked very little about her drug use, but emphasized the things she has done since getting out of treatment in [facility]. She described her daily recovery plan and encouraged group members to become as open as the can be and talk about their feelings. Questions were asked by group members and there was a good discussion on the vulnerabilities in early recovery.   Group Time: 2:45-4pm  Participation Level: Active  Behavioral Response: Appropriate and Sharing  Type of Therapy: Activity Group  Topic: Family Sculpture/Graduation: the second part of group was spent in the activity, "Family Sculpture". Members volunteered to 'sculpt' a scene from their early family life. We had done this in the previous session and the activity had generated considerable interest and a number of members had asked to do sculpt their families. This activity was ended early to allow time for a graduation. A member was completing the program successfully and he was being honored with a special coin, brownies, and tributes. Prior to the coin ceremony, the patient read a letter he had written to his father, who had died in 05-07-2022 of this year. It was a very moving tribute and brought tears to almost everyone. The session proved very powerful and emotional on many levels.   Summary: The patient was attentive and asked some good questions of the guest speaker. He volunteered to 'sculpt' his family when the group reconvened after break. He sculpted a very distant family with himself mostly alone and trying to keep up with his father who 'was always working'.  The group provided good feedback to the member as he looked at the family he had shaped. He admitted that he had been thinking about this since the last group when another member had sculpted her family. He realized there was going to be some painful realizations that he would uncover through this exercise. The patient admitted that he had always been busy 'working' and even after coming home from his 40 hr weekly employment, he wandered around [place] farm, drinking, and checking on things and keeping himself moving. I pointed out he did not allow anytime to stop, sit and just be with his wife and young child. He agreed with this completely and noted that last weekend he had been sitting with them and found himself extremely uncomfortable. He had forced himself to stay there and not get up and go outside. The patient realized that he had assumed keeping busy and working was what one did because he had learned this from his father. The patient seems to have gained some significant insight into his early life and stated today that he didn't intend to perpetuate this mentality going forward. He made some excellent comments and is making good progress in his recovery.   Family Program: Family present? No   Name of family member(s):   UDS collected: No Results:   AA/NA attended?: YesTuesday  Sponsor?: Yes   Lyndsey Demos, LCAS

## 2012-10-29 ENCOUNTER — Other Ambulatory Visit (HOSPITAL_COMMUNITY): Payer: Managed Care, Other (non HMO) | Admitting: Psychology

## 2012-10-29 ENCOUNTER — Other Ambulatory Visit (HOSPITAL_COMMUNITY): Payer: Self-pay | Admitting: Physician Assistant

## 2012-10-29 DIAGNOSIS — F329 Major depressive disorder, single episode, unspecified: Secondary | ICD-10-CM

## 2012-10-29 DIAGNOSIS — F1320 Sedative, hypnotic or anxiolytic dependence, uncomplicated: Secondary | ICD-10-CM

## 2012-10-29 DIAGNOSIS — F1020 Alcohol dependence, uncomplicated: Secondary | ICD-10-CM | POA: Diagnosis not present

## 2012-10-29 MED ORDER — MIRTAZAPINE 15 MG PO TABS: 15.0000 mg | ORAL_TABLET | Freq: Every day | ORAL | Status: DC

## 2012-10-29 NOTE — Telephone Encounter (Signed)
  Subjective: Trevor Mendez last to be seen today because he feels that he is slipping back into depression. He reports that over the past 4 days he has been going back to bed after he gets up in the morning and wants to stay in bed all day. He also endorses that he is not eating well, and has lost several pounds over the past couple of weeks. He feels very unmotivated. He has continued to take the Abilify 2.5 mg daily. He denies thoughts of suicide or homicide.  Objective: Fully alert and oriented and in no acute distress. Depressed mood and congruent affect. Obviously thinner and somewhat lethargic. Speech is clear and coherent irregular rate and rhythm and normal volume. Insight and judgment are fair. Cognitive functions are within normal limits. No outward signs of suicidality, homicidality, or psychosis.  Assessment and Plan: We will increase his Abilify to 5 mg daily, and change his trazodone to Remeron 15 mg at bedtime to target sleep as well as appetite.

## 2012-10-31 ENCOUNTER — Encounter (HOSPITAL_COMMUNITY): Payer: Self-pay | Admitting: Psychology

## 2012-10-31 NOTE — Progress Notes (Signed)
    Daily Group Progress Note  Program: CD-IOP   Group Time: 1-2:30 pm  Participation Level: Active  Behavioral Response: Appropriate and Sharing  Type of Therapy: Process Group  Topic: Group Process: the first part of group was spent in process. Members shared bout current issues and concerns. The newest group member vented and disclosed many of his feelings felt in the past 45 hours or since he left this group session on Wednesday. During the time the medical director met with another new member. A brief psycho-ed piece was provided on the 4-piece relapse process. There was a good discussion and feedback on disclosures in this session.   Group Time: 2:45- 4pm  Participation Level: Active  Behavioral Response: Appropriate  Type of Therapy: Activity Group  Topic: Family Sculpture: the second half of group was spent in an activity  that we first began on Monday. Since the first day, members have admitted being very interested in their own biological families and all wanted the opportunity to 'sculpt' their family. The session proved very revealing and as members talked and described the meanings behind these forms and it proved very therapeutic. As the session ended, members shared their plans for the upcoming weekend.    Summary: The patient reported he was doing okay. He admitted he has cravings and continues to struggle in early recovery. I pointed out the lack of disclosure that this patient had been guilty of with his wife. He had never told her about the Family Group Session held a few weeks ago. He had never told her about it and lied to me that she had to work. This had come up in our couple's therapy session earlier this week. After the treatment team meeting yesterday, I had been encouraged to discuss this further because he had lied to two people. Today, the patient admitted he couldn't figure out why he hadn't invited her to the session. He admitted it was very disappointing  and noted that his wife had been crying when he left to come to group today. The patient reported it is very painful for him to see his wife cry, but he didn't know how to comfort her or even what to say. He explained he had held her and tried to comfort her, but nothing seemed to help. He provided good feedback and seems to genuinely be working to change himself, but after numbing his feelings for 15+ years, the patient is struggling to be comfortable without being drunk or high. He participated in some of the sculptures in the second half of group and did speak with the medical director privately during the half of group. He reported intention to attend at least two meetings this weekend. His sobriety date remains 6/5.    Family Program: Family present? No   Name of family member(s):   UDS collected: No Results:   AA/NA attended?: YesThursday  Sponsor?: Yes   Trestin Vences, LCAS

## 2012-11-01 ENCOUNTER — Other Ambulatory Visit (HOSPITAL_COMMUNITY): Payer: Managed Care, Other (non HMO) | Admitting: Psychology

## 2012-11-01 ENCOUNTER — Other Ambulatory Visit: Payer: Self-pay | Admitting: Internal Medicine

## 2012-11-01 DIAGNOSIS — F1320 Sedative, hypnotic or anxiolytic dependence, uncomplicated: Secondary | ICD-10-CM

## 2012-11-01 DIAGNOSIS — F1020 Alcohol dependence, uncomplicated: Secondary | ICD-10-CM | POA: Diagnosis not present

## 2012-11-01 DIAGNOSIS — F329 Major depressive disorder, single episode, unspecified: Secondary | ICD-10-CM

## 2012-11-02 ENCOUNTER — Encounter (HOSPITAL_COMMUNITY): Payer: Self-pay | Admitting: Psychology

## 2012-11-02 NOTE — Progress Notes (Signed)
    Daily Group Progress Note  Program: CD-IOP   Group Time: 1-2:30 pm  Participation Level: Active  Behavioral Response: Appropriate and Sharing  Type of Therapy: Process Group  Topic: Feelings: Why they are so Important. The second half of group was spent in a psycho-ed session on the topic of feelings. Members were provided a handout that identified why feelings are so important. Members were open about the struggles with feelings and how difficult they are in early recovery. They were asked to identify what they were feeling, but many still appear frustrated or uncertain whey they are feeling as they do. This will be an ongoing process they will deal with in early recovery.  Group Time: 2:45- 4pm  Participation Level: Active  Behavioral Response: Sharing  Type of Therapy: Psycho-education Group  Topic: Feelings: Why they are so Important. The second half of group was spent in a psycho-ed session on the topic of feelings. Members were provided a handout that identified why feelings are so important. Members were open about the struggles with feelings and how difficult they are in early recovery. They were asked to identify what they were feeling, but many still appear frustrated or uncertain whey they are feeling as they do. This will be an ongoing process they will deal with in early recovery.   Summary: the patient reported he had had a good weekend. The rain had made it a little more difficult because he stayed in and that meant sitting and talking with others. He has recently realized that getting out of the house and doing 'chores' or whatever he was actually doing was addictive behavior and allowed him to drink while he was outside. By remaining in the house, he is forced to deal with feelings, speak with his wife and play with his daughter. He is finding these things very difficult to handle at times. He reported he had dreamed of drinking and was disturbed when he didn't feel  remorseful about having drank. The patient was asked to identify his feelings and he reported feeling angry. He couldn't explain why he is angry, but he reported he was. The patient continues to make good progress and has remained alcohol and drug-free despite struggling with these newfound feelings. His sobriety date is 6/5.     Family Program: Family present? No   Name of family member(s):   UDS collected: Yes Results: not back yet  AA/NA attended?: YesFriday and Sunday  Sponsor?: Yes   Sim Choquette, LCAS

## 2012-11-03 ENCOUNTER — Other Ambulatory Visit (HOSPITAL_COMMUNITY): Payer: Managed Care, Other (non HMO) | Admitting: Psychology

## 2012-11-03 DIAGNOSIS — F329 Major depressive disorder, single episode, unspecified: Secondary | ICD-10-CM

## 2012-11-03 DIAGNOSIS — F1020 Alcohol dependence, uncomplicated: Secondary | ICD-10-CM | POA: Diagnosis not present

## 2012-11-03 DIAGNOSIS — F1320 Sedative, hypnotic or anxiolytic dependence, uncomplicated: Secondary | ICD-10-CM

## 2012-11-05 ENCOUNTER — Other Ambulatory Visit (HOSPITAL_COMMUNITY): Payer: Managed Care, Other (non HMO) | Admitting: Psychology

## 2012-11-05 DIAGNOSIS — F329 Major depressive disorder, single episode, unspecified: Secondary | ICD-10-CM

## 2012-11-05 DIAGNOSIS — F1020 Alcohol dependence, uncomplicated: Secondary | ICD-10-CM

## 2012-11-05 DIAGNOSIS — F1320 Sedative, hypnotic or anxiolytic dependence, uncomplicated: Secondary | ICD-10-CM

## 2012-11-07 ENCOUNTER — Encounter (HOSPITAL_COMMUNITY): Payer: Self-pay | Admitting: Psychology

## 2012-11-07 NOTE — Progress Notes (Signed)
    Daily Group Progress Note  Program: CD-IOP   Group Time: 1-2:30 pm  Participation Level: Minimal  Behavioral Response: Appropriate and Sharing  Type of Therapy: Psycho-education Group  Topic: Visit with the Chaplain: after a brief check-in, the group welcomed a visit from [employer], the chaplain at the [facility]. After introductions, the chaplain invited a discussion about "Hope". She invited group members to share what they hope for, especially what they hope to gain from treatment. There was good disclosure among members. The chaplain challenged members to identify the difference between hoping and wishing? The importance of being "pro-active" or working towards hope was emphasized.     Group Time: 2:45- 4pm  Participation Level: Active  Behavioral Response: Appropriate and Sharing  Type of Therapy: Process Group  Topic: Matrix/Introductions: a brief presentation utilizing the Matrix treatment program was presented in the first part of this half of group. A slide show was presented that highlighted the development through of addiction through reinforcement. The way triggers and cravings first develop and then grow in strength. Two new members were present today and they were invited to tell their new group members a little bit about themselves and what they hoped to get from the group. There was good feedback and support with members inviting the new members to join them this evening at some specific 12-step meetings.     Summary:  The patient was attentive and shared that his hope is the hope of 'freedom'. He wants to be free to make decisions and spend his time doing what he wants and not having to drink or drug because he is addicted. In the second half of group, the patient reported he had enjoyed the session with the chaplain and that yes, it required work to get the things we really want. The patient made some good comments and was able to articulate how triggers  and responses have to be eliminated by replacing them with other learned and conditioned responses. He made some good comments and provided helpful feedback to the new group members.    Family Program: Family present? No   Name of family member(s):   UDS collected: Yes Results: negative  AA/NA attended?: YesTuesday  Sponsor?: Yes   Ojani Berenson, LCAS

## 2012-11-08 ENCOUNTER — Encounter (HOSPITAL_COMMUNITY): Payer: Self-pay | Admitting: Psychology

## 2012-11-08 ENCOUNTER — Other Ambulatory Visit (HOSPITAL_COMMUNITY): Payer: Managed Care, Other (non HMO) | Admitting: Psychology

## 2012-11-08 DIAGNOSIS — F329 Major depressive disorder, single episode, unspecified: Secondary | ICD-10-CM

## 2012-11-08 DIAGNOSIS — F1320 Sedative, hypnotic or anxiolytic dependence, uncomplicated: Secondary | ICD-10-CM

## 2012-11-08 DIAGNOSIS — F1020 Alcohol dependence, uncomplicated: Secondary | ICD-10-CM

## 2012-11-08 NOTE — Progress Notes (Signed)
    Daily Group Progress Note  Program: CD-IOP   Group Time: 1-2:30 pm  Participation Level: Active  Behavioral Response: Appropriate  Type of Therapy: Process Group  Topic: Group Process: the first part of group today was spent in process. Members shared about what they are currently dealing with. I announced one member had left a message informing me she was entering residential treatment at [facility] [place]. There was also a new group member present. Members seemed somewhat reserved and were, finally, instructed to talk about what they had been doing. During this part of group, the medical director was meeting two of the members that had started treatment earlier this week as well as a member who was discharging today.   Group Time: 2:45- 4pm  Participation Level: Minimal  Behavioral Response: Sharing  Type of Therapy: Psycho-education Group  Topic: "What are you willing to change?" The second part of group was focused on the addressing the 'conditioning' that addiction develops from and how members can change it. They were asked what they would be willing to do differently. While some easily offered the changes they have made to date, others seemed resistant to identifying any changes they would commit to. As the session neared the end, a graduation ceremony was held with members honoring the graduating member with kind and hopeful words offered him.   Summary: The patient reported he is doing well. He noted that he had started back at work part-time. He is glad he started back on Thursday so he only had two days before the weekend. The patient admitted that nothing had changed at his work place. He agreed when challenged, that he didn't really think anything would change there, but he has. The patient noted there is so much that goes on at work that is 'petty' and he can't stand it. The patient reported that the chaplain's session had really made him  think about things and it was really helpful. The patient reported he is working hard to change things with his wife and daughter. He pointed out that he is staying in the room with them talking rather than going outside to get away from them. The patient made some good comments and displayed a good understanding of his recovery needs. He is doing everything he has been instructed to do in order to change his everyday life. The patient's sobriety date remains 6/5.    Family Program: Family present? No   Name of family member(s):   UDS collected: No Results:   AA/NA attended?: YesThursday  Sponsor?: Yes   Mareon Robinette, LCAS

## 2012-11-09 ENCOUNTER — Encounter (HOSPITAL_COMMUNITY): Payer: Self-pay | Admitting: Psychology

## 2012-11-09 NOTE — Progress Notes (Unsigned)
    Daily Group Progress Note  Program: CD-IOP   Group Time: 1-2:30 pm  Participation Level: Active  Behavioral Response: Appropriate and Sharing  Type of Therapy: Process Group  Topic: Group Process: the first part of group was spent in process. Two students from the PA program at [facility] were also in the group. Members checked-in and described what they had done over the weekend that supported their recovery. Many had attended a number of meetings. One member expressed her need to share what she was feeling from the session on Friday. She and I had had a confrontation of sorts after I requested that she put away her cell phone. In the 7+ sessions she has attended, it is safe to say that she has been asked 6-7 times to put away her phone. The patient expressed her anger over this incident and admitted she had thought about it over and over through the course of the weekend. The remainder of the session was spent with the group processing their experience on Friday and their feelings today. The session proved very intense and, ultimately, therapeutic for all of the members.    Group Time: 2:45-4pm  Participation Level: Active  Behavioral Response: Sharing  Type of Therapy: Psycho-education Group  Topic: "The Wheel of Life": the second half of group was spent in a psycho-ed session . Members were provided a handout entitled, "The Wheel of Life". they were asked to chart where they were relative to the eight different sections. The session invited members to share about the balance in their lives and what they might consider doing to make changes. members presented their wheels on the board and explained why they had charted them this way. Group members provided good feedback and the session proved effective and therapeutic.   Summary: The patient reported he had had a tough weekend. He reported he had done a lot of work on his property on Saturday and he struggled with cravings and  thoughts of drinking all day. He admitted he was "mad". I reminded the patient that the weekend, specifically Saturday, was a day he typically drank over the course of the entire day and it is not surprising that he experienced physical cravings on this past hot muggy Saturday. He provided good feedback during the discussion on the events on Friday. He was diplomatic, but also emphasized the difficulty and commitment recovery requires. He made good feedback in the second half of group and showed how his wheel, completed 2 months ago, was not unlike his wheel today. The patient continues to make good progress and is very honest about his struggles in early recovery. His sobriety date remains 6/5.   Family Program: Family present? No   Name of family member(s): ***  UDS collected: No Results:   AA/NA attended?: YesFriday and Sunday  Sponsor?: Yes   Jatavius Ellenwood, LCAS

## 2012-11-10 ENCOUNTER — Encounter (HOSPITAL_COMMUNITY): Payer: Self-pay

## 2012-11-10 ENCOUNTER — Other Ambulatory Visit (HOSPITAL_COMMUNITY): Payer: Managed Care, Other (non HMO) | Admitting: Psychology

## 2012-11-10 DIAGNOSIS — F329 Major depressive disorder, single episode, unspecified: Secondary | ICD-10-CM

## 2012-11-10 DIAGNOSIS — F1020 Alcohol dependence, uncomplicated: Secondary | ICD-10-CM | POA: Diagnosis not present

## 2012-11-10 DIAGNOSIS — F1320 Sedative, hypnotic or anxiolytic dependence, uncomplicated: Secondary | ICD-10-CM

## 2012-11-11 ENCOUNTER — Encounter (HOSPITAL_COMMUNITY): Payer: Self-pay | Admitting: Psychiatry

## 2012-11-11 NOTE — Progress Notes (Unsigned)
  [facility] Intensive Outpatient Discharge Summary   Trevor Mendez [id number]  Date of Admission: 09/01/2012 Date of Discharge: 11/11/2012  Course of Treatment: Patient attended and participated in individual and group therapy.  He began to process his emotions and wants to continue this work after discharge with a therapist, [place] to [place].  Mr.Renfro maintained his sobriety, found an AA group he enjoys attending, and obtained a sponsor from AA.  He is motivated by his family to maintain sobriety especially his young daughter.    Goals and Activities to Help Maintain Sobriety: 1. Stay away from old friends who continue to drink and use mind-altering chemicals. 2. Continue practicing Fair Fighting rules in interpersonal conflicts. 3. Continue alcohol and drug refusal skills and call on support systems. 4. Go visit a friend 5. Call a friend 6. Call his sponsor  Referrals: Therapist near [facility], [facility], [facility], [place]  Aftercare services:  1. Attend AA/NA meetings daily 2. Maintain sponsorship. 3. Return to Psychotherapist:   Next appointment:  Will be scheduled before discharge.  Plan of Action to Address Continuing Problems:  None noted--sobriety addressed above, other concerns will be addressed by his outside therapist.  Client has participated in the development of this discharge plan and has received a copy of this completed plan  [facility]  11/11/2012   Nelly Rout, MD 11/11/2012

## 2012-11-12 ENCOUNTER — Encounter (HOSPITAL_COMMUNITY): Payer: Self-pay

## 2012-11-12 ENCOUNTER — Other Ambulatory Visit (HOSPITAL_COMMUNITY): Payer: Managed Care, Other (non HMO)

## 2012-11-15 ENCOUNTER — Encounter (HOSPITAL_COMMUNITY): Payer: Self-pay | Admitting: Psychology

## 2012-11-15 ENCOUNTER — Other Ambulatory Visit (HOSPITAL_COMMUNITY): Payer: Self-pay | Admitting: Physician Assistant

## 2012-11-15 ENCOUNTER — Other Ambulatory Visit (HOSPITAL_COMMUNITY): Payer: Managed Care, Other (non HMO) | Admitting: Psychology

## 2012-11-15 ENCOUNTER — Encounter (HOSPITAL_COMMUNITY): Payer: Self-pay

## 2012-11-15 DIAGNOSIS — F331 Major depressive disorder, recurrent, moderate: Secondary | ICD-10-CM

## 2012-11-15 DIAGNOSIS — F10239 Alcohol dependence with withdrawal, unspecified: Secondary | ICD-10-CM

## 2012-11-15 DIAGNOSIS — F1020 Alcohol dependence, uncomplicated: Secondary | ICD-10-CM | POA: Diagnosis not present

## 2012-11-15 DIAGNOSIS — F1320 Sedative, hypnotic or anxiolytic dependence, uncomplicated: Secondary | ICD-10-CM

## 2012-11-15 NOTE — Progress Notes (Signed)
    Daily Group Progress Note  Program: CD-IOP   Group Time: 1-2:30 pm  Participation Level: Active  Behavioral Response: Appropriate and Sharing  Type of Therapy: Psycho-education Group  Topic: Stages of Change: After completing a check-in, a psycho-ed piece was presented on "The Stages of Change'. Members were provided a handout that identified the stages of change. A brief explanation of the stages was provided and the importance of moving back and forth as well as the time frame of change emphasized. Members were invited to identify where they felt they were in the stages of change. They were also encouraged to identify what they would do if they fell out or relapsed and how they return to the stage they needed to be. The session proved very effective and members responded well to this concept of change as a process.   Group Time: 2:45- 4pm  Participation Level: Active  Behavioral Response: Sharing  Type of Therapy: Process Group  Topic: Group Process: The second part of group was spent in process. Members shared about current issues and concerns. There was good feedback and discussion and members seemed open and willing to discuss what they were struggling with at this time. The session ended with a tension-breaker and an opportunity to learn more about each other and the unknown strengths, talents, and experiences each of the group member brings to the table. This session provided good insight, encouraged openness that proved very safe, and there was good feedback and validation among group members.   Summary: The patient reported he is doing well. He described himself as being in the 'action' phase of change. He has made many significant changes and is getting good support from family and friends. [name] admitted in process that he is finding his work frustrating and is surprised to realize how 'petty' everyone is. I suggested that when he was drinking and either hung over and  feeling like crap, he probably never really noticed it. Or, more likely, he might have been petty himself. The patient reported he is going to get his resume together and look for other opportunities. He was encouraged by other group members and at 32 yo, they assured him he would find new things to do that he would enjoy and find fulfilling. I reminded the group that this patient will be graduating on Friday. His sobriety date remains 6/5.    Family Program: Family present? No   Name of family member(s):   UDS collected: No Results:   AA/NA attended?: YesTuesday  Sponsor?: Yes   Kevonta Phariss, LCAS

## 2012-11-16 ENCOUNTER — Encounter (HOSPITAL_COMMUNITY): Payer: Self-pay | Admitting: Psychology

## 2012-11-16 NOTE — Progress Notes (Signed)
    Daily Group Progress Note  Program: CD-IOP   Group Time: 1-2:30 pm  Participation Level: Active  Behavioral Response: Appropriate and Sharing  Type of Therapy: Process Group  Topic:Group Process: the first part of group was spent in process members shared about their days in early recovery since the last group session. Group had been cancelled on Friday due to energy problems. Two new members were also present and they received a warm welcome. A number of members shared about putting themselves in high risk situations and those were processed by members. Halfway through this session, because of continued problems with the [facility], group moved out into the courtyard under the covered shelter. It was cooler and there was a delightful breeze. The remainder of this session was spent outside.    Group Time: 2:45- 4pm  Participation Level: Active  Behavioral Response: Appropriate and Sharing  Type of Therapy: Psycho-education Group  Topic: Dopamine Deficiency/Graduation: the second half of group was spent in a review of the dopamine deficit identified within the brains of addicts and alcoholics. Addiction develops out of the search to find whatever makes that individual feel whole again. This is most easily experienced through drugs and alcohol. Members shared the struggles they have had and many expressed frustration over fellow group members and their willingness to put themselves in high risk situations. As the session neared an end, a graduation was held for a member who was leaving the program having completed successfully. Kind words and plenty of hope were shared and the graduating member expressed his appreciation to his fellow group members and encouraged them to take advantage of this experience.     Summary: The group member arrived a little late today, but had called and informed me he was behind. The patient reported a great weekend and that he had played 72 holes of golf. He had  also attended the Sunday evening AA meeting that he had intentionally stopped going to about a month ago. He decided he needed more meetings since he would not have this program any longer. The patient reported this program has really changed his life and he was very grateful to all of the group members who have been so supportive of him. I reminded the group that [name] has been very open throughout the program and continued to work hard despite the discomfort of trying things new and recognizing how dysfunctional many of his life patterns really are. The group applauded his progress and encouraged him to remain try to his recovery and continue with meetings. This fellow has been a wonderful group member and really given his all in our sessions. He has had to change most of his life and has had two sessions with his wife present. He has done everything we have asked of him and he has remained alcohol and drug-free since June 5th. He leaves the program having graduated successfully.   Family Program: Family present? No   Name of family member(s):   UDS collected: No Results:  AA/NA attended?: [place]  Sponsor?: Yes   Mahima Hottle, LCAS

## 2012-11-17 ENCOUNTER — Other Ambulatory Visit (HOSPITAL_COMMUNITY): Payer: Self-pay | Admitting: Physician Assistant

## 2012-11-30 ENCOUNTER — Encounter: Payer: Self-pay | Admitting: Internal Medicine

## 2012-11-30 ENCOUNTER — Ambulatory Visit (INDEPENDENT_AMBULATORY_CARE_PROVIDER_SITE_OTHER): Payer: Managed Care, Other (non HMO) | Admitting: Internal Medicine

## 2012-11-30 ENCOUNTER — Other Ambulatory Visit (INDEPENDENT_AMBULATORY_CARE_PROVIDER_SITE_OTHER): Payer: Managed Care, Other (non HMO)

## 2012-11-30 VITALS — BP 136/86 | HR 66 | Temp 98.3°F | Ht 70.0 in | Wt 152.0 lb

## 2012-11-30 DIAGNOSIS — Z0000 Encounter for general adult medical examination without abnormal findings: Secondary | ICD-10-CM

## 2012-11-30 DIAGNOSIS — F1020 Alcohol dependence, uncomplicated: Secondary | ICD-10-CM

## 2012-11-30 DIAGNOSIS — Z789 Other specified health status: Secondary | ICD-10-CM

## 2012-11-30 DIAGNOSIS — F1320 Sedative, hypnotic or anxiolytic dependence, uncomplicated: Secondary | ICD-10-CM

## 2012-11-30 DIAGNOSIS — I10 Essential (primary) hypertension: Secondary | ICD-10-CM

## 2012-11-30 DIAGNOSIS — F322 Major depressive disorder, single episode, severe without psychotic features: Secondary | ICD-10-CM

## 2012-11-30 DIAGNOSIS — G43909 Migraine, unspecified, not intractable, without status migrainosus: Secondary | ICD-10-CM

## 2012-11-30 LAB — LIPID PANEL
Cholesterol: 226 mg/dL — ABNORMAL HIGH (ref 0–200)
Total CHOL/HDL Ratio: 5
VLDL: 31 mg/dL (ref 0.0–40.0)

## 2012-11-30 LAB — COMPREHENSIVE METABOLIC PANEL
ALT: 45 U/L (ref 0–53)
Alkaline Phosphatase: 82 U/L (ref 39–117)
Glucose, Bld: 87 mg/dL (ref 70–99)
Sodium: 137 mEq/L (ref 135–145)
Total Bilirubin: 0.9 mg/dL (ref 0.3–1.2)
Total Protein: 7.6 g/dL (ref 6.0–8.3)

## 2012-11-30 LAB — LDL CHOLESTEROL, DIRECT: Direct LDL: 159.6 mg/dL

## 2012-11-30 LAB — HEPATIC FUNCTION PANEL
ALT: 45 U/L (ref 0–53)
AST: 28 U/L (ref 0–37)
Albumin: 4.4 g/dL (ref 3.5–5.2)

## 2012-11-30 NOTE — Assessment & Plan Note (Signed)
BP Readings from Last 3 Encounters:  11/30/12 136/86  08/28/12 114/73  08/25/12 116/78   Good control

## 2012-11-30 NOTE — Patient Instructions (Addendum)
Thanks for coming in. You have done a great and difficult job of getting control of you life. Keep working your program - it is a long term commitment that pays huge dividends.  Your exam is normal.  Routine labs today and the results will be available on MyChart.  You should have a full physical exam in 3 years. Return sooner as needed.

## 2012-11-30 NOTE — Assessment & Plan Note (Signed)
Working his recovery

## 2012-11-30 NOTE — Assessment & Plan Note (Signed)
Doing well - 93 days clean. Working his recovery - AA 3 times a week, has a sponsor.  Plan Continue AA  Continue 12 step program

## 2012-11-30 NOTE — Assessment & Plan Note (Signed)
Interval h/o significant for successful detox. He has had no other health problems. He does need to see a dentist. Labs ordered and pending. Tdap is current. He is exercising (running) and working hard.  In summary - a very pleasant man who is doing much better. Advised to have follow up general exam in 3 years. He will need brief visit 1 year for Blood pressure maintenance and lab check (Bmet)

## 2012-11-30 NOTE — Assessment & Plan Note (Signed)
Sees Mr. Trevor Salvage, NP - doing well. Meds: prozac, remeron, lamotrigine

## 2012-11-30 NOTE — Assessment & Plan Note (Signed)
stable with decreased frequency since detox

## 2012-11-30 NOTE — Progress Notes (Signed)
Subjective:    Patient ID: Trevor Mendez, male    DOB: 29-Dec-1980, 32 y.o.   MRN: [id number]  HPI In the interval since his last visit he has been thru detox off of alcohol, cocaine. He has an addictive personality. He is seeing Cherylann Banas NP Emerson Monte supervising MD) and he has been started on Lamotrigine. Continues on Prozac and remeron. He is sleeping pretty well. He is attending AA  3/wk, has a sponsor. Working the 12 steps.  He feels physically fit: like he was in high school. He is running and working.   He has seen the eye doctor in the last year. Last dentist visit >3 yrs.  Past Medical History  Diagnosis Date  . MIGRAINE HEADACHE 04/10/2007    Qualifier: Diagnosis of  By: Genelle Gather CMA, [place]    . DEPRESSION, MAJOR, SEVERE 09/13/2009    Qualifier: Diagnosis of  By: Debby Bud MD, Rosalyn Gess   . HTN (hypertension) 10/09/2011   Past Surgical History  Procedure Laterality Date  . Finger surgury    . Orif ankle fracture Right   . Wisdom tooth extraction     Family History  Problem Relation Age of Onset  . Hypertension Mother   . Hypertension Father   . Alcohol abuse Father   . Alcohol abuse Paternal Aunt   . Alcohol abuse Paternal Uncle   . Alcohol abuse Paternal Grandfather   . Schizophrenia Paternal Grandfather   . Alcohol abuse Paternal Grandmother    History   Social History  . Marital Status: Married    Spouse Name: N/A    Number of Children: 1  . 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 Smoking status: Former Games developer  . Smokeless tobacco: Current User    Types: Chew  . Alcohol Use: 0.0 oz/week    70-80 Cans of beer per week     Comment: everydayl last drink 7 days ago  . Drug Use: 30.00 per week    Special: Benzodiazepines  . Sexual Activity: Yes    Partners: Female   Other Topics Concern  . Not on file   Social History Narrative   HSG, Scientist, product/process development college. Married  -'11(?). 1 dtr - '12. Work - Chartered certified accountant.      09/03/2012 [name] was born and grew up in [place], [place]. He has a younger sister. His parents are still together and healthy. He graduated from high school, and has all but a degree at [employer] in Musician. 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 He has been married for 5 years, and has an 75-month-old daughter. He currently lives with his wife and daughter. 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 His social support system consists of his sponsor, wife, mother, father, sister, brother-in-law, and friends. He affiliates as a International aid/development worker. He denies any legal problems. He enjoys golf and firearms. 09/03/2012 [facility]    Current Outpatient Prescriptions on File Prior to Visit  Medication Sig Dispense Refill  . amLODipine (NORVASC) 5 MG tablet TAKE 1 TABLET BY MOUTH DAILY  90 tablet  3  . FLUoxetine (PROZAC) 20 MG capsule TAKE ONE CAPSULE BY MOUTH TWICE A DAY  60 capsule  3  . hydrochlorothiazide (MICROZIDE) 12.5 MG capsule TAKE ONE CAPSULE BY MOUTH EVERY DAY  30 capsule  11  . mirtazapine (REMERON) 15 MG tablet Take 1  tablet (15 mg total) by mouth at bedtime.  30 tablet  0   No current facility-administered medications on file prior to visit.     Review of Systems Constitutional:  Negative for fever, chills, activity change and unexpected weight change.  HEENT:  Negative for hearing loss, ear pain, congestion, neck stiffness and postnasal drip. Negative for sore throat or swallowing problems. Negative for dental complaints.   Eyes: Negative for vision loss or change in visual acuity.  Respiratory: Negative for chest tightness and wheezing. Negative for DOE.   Cardiovascular: Negative for chest pain or palpitations. No decreased exercise tolerance Gastrointestinal: No change in bowel habit. No bloating or gas. No reflux or indigestion Genitourinary: Negative for urgency,  frequency, flank pain and difficulty urinating.  Musculoskeletal: Negative for myalgias, back pain, arthralgias and gait problem.  Neurological: Negative for dizziness, tremors, weakness and headaches.  Hematological: Negative for adenopathy.  Psychiatric/Behavioral: Negative for behavioral problems and dysphoric mood.       Objective:   Physical Exam Filed Vitals:   11/30/12 1321  BP: 136/86  Pulse: 66  Temp: 98.3 F (36.8 C)   Wt Readings from Last 3 Encounters:  11/30/12 152 lb (68.947 kg)  08/25/12 149 lb (67.586 kg)  08/20/12 148 lb (67.132 kg)   Gen'l: Well nourished well developed     male in no acute distress  HEENT: Head: Normocephalic and atraumatic. Right Ear: External ear normal. EAC/TM nl. Left Ear: External ear normal.  EAC/TM nl. Nose: Nose normal. Mouth/Throat: Oropharynx is clear and moist. Dentition - native, in good repair. No buccal or palatal lesions. Posterior pharynx clear. Eyes: Conjunctivae and sclera clear. EOM intact. Pupils are equal, round, dilated and reactive to light. Right eye exhibits no discharge. Left eye exhibits no discharge. Neck: Normal range of motion. Neck supple. No JVD present. No tracheal deviation present. No thyromegaly present.  Cardiovascular: Normal rate, regular rhythm, no gallop, no friction rub, no murmur heard.      Quiet precordium. 2+ radial and DP pulses . No carotid bruits Pulmonary/Chest: Effort normal. No respiratory distress or increased WOB, no wheezes, no rales. No chest wall deformity or CVAT. Abdomen: Soft. Bowel sounds are normal in all quadrants. He exhibits no distension, no tenderness, no rebound or guarding, No heptosplenomegaly  Genitourinary:   Musculoskeletal: Normal range of motion. He exhibits no edema and no tenderness.       Small and large joints without redness, synovial thickening or deformity. Full range of motion preserved about all small, median and large joints.  Lymphadenopathy:    He has no cervical  or supraclavicular adenopathy.  Neurological: He is alert and oriented to person, place, and time. CN II-XII intact. DTRs 2+ and symmetrical biceps, radial and patellar tendons. Cerebellar function normal with no tremor, rigidity, normal gait and station.  Skin: Skin is warm and dry. No rash noted. No erythema.  Psychiatric: He has a normal mood and affect. His behavior is normal. Thought content normal.         Assessment & Plan:

## 2012-12-03 ENCOUNTER — Encounter: Payer: Self-pay | Admitting: Internal Medicine

## 2013-02-21 ENCOUNTER — Other Ambulatory Visit: Payer: Self-pay | Admitting: Internal Medicine

## 2013-03-10 ENCOUNTER — Encounter: Payer: Self-pay | Admitting: Internal Medicine

## 2013-03-10 ENCOUNTER — Ambulatory Visit (INDEPENDENT_AMBULATORY_CARE_PROVIDER_SITE_OTHER): Payer: Managed Care, Other (non HMO) | Admitting: Internal Medicine

## 2013-03-10 ENCOUNTER — Ambulatory Visit (INDEPENDENT_AMBULATORY_CARE_PROVIDER_SITE_OTHER)
Admission: RE | Admit: 2013-03-10 | Discharge: 2013-03-10 | Disposition: A | Discharge status: Home / Self Care | Payer: Managed Care, Other (non HMO) | Source: Ambulatory Visit | Attending: Internal Medicine | Admitting: Internal Medicine

## 2013-03-10 VITALS — BP 120/92 | HR 65 | Temp 98.6°F | Wt 160.8 lb

## 2013-03-10 DIAGNOSIS — M545 Low back pain: Secondary | ICD-10-CM

## 2013-03-10 MED ORDER — KETOROLAC TROMETHAMINE 60 MG/2ML IM SOLN
60.0000 mg | Freq: Once | INTRAMUSCULAR | Status: AC
  Administered 2013-03-10: 60 mg via INTRAMUSCULAR

## 2013-03-10 MED ORDER — KETOROLAC TROMETHAMINE 10 MG PO TABS: 10.0000 mg | ORAL_TABLET | Freq: Four times a day (QID) | ORAL | Status: DC | PRN

## 2013-03-10 MED ORDER — PREDNISONE 10 MG PO TABS: 10.0000 mg | ORAL_TABLET | Freq: Every day | ORAL | Status: DC

## 2013-03-10 MED ORDER — CYCLOBENZAPRINE HCL 10 MG PO TABS
10.0000 mg | ORAL_TABLET | Freq: Three times a day (TID) | ORAL | Status: DC | PRN
Start: 2013-03-10 — End: 2014-06-24

## 2013-03-10 NOTE — Patient Instructions (Signed)
Back pain lumbar spine - worrisome for a herniated disk.  Plan X-rays this evening, if abnormal will be setting up an MRI lumbar spine on an urgent basis  Ketorolac 10 mg three times a day - a non narcotic pain medication  Flexeril 10 mg three times a day - a muscle relaxant to control muscle spasms - watch for drowsiness  Prednisone - burst and taper - to reduce inflammation and swelling as a way to relieve pain  Out of work until Monday  Herniated Disk The bones of your spinal column (vertebrae) protect your spinal cord and nerves that go into your arms and legs. The vertebrae are separated by disks that cushion the spinal column and put space between your vertebrae. This allows movement between the vertebrae, which allows you to bend, rotate, and move your body from side to side. Sometimes, the disks move out of place (herniate) or break open (rupture) from injury or strain. The most common area for a disk herniation is in the lower back (lumbar area). Sometimes herniation occurs in the neck (cervical) disks.  CAUSES  As we grow older, the strong, fibrous cords that connect the vertebrae and support and surround the disks (ligaments) start to weaken. A strain on the back may cause a break in the disk ligaments. RISK FACTORS Herniated disks occur most often in men who are aged 18 years to 35 years, usually after strenuous activity. Other risk factors include conditions present at birth (congenital) that affect the size of the lumbar spinal canal. Additionally, a narrowing of the areas where the nerves exit the spinal canal can occur as you age. SYMPTOMS  Symptoms of a herniated disk vary. You may have weakness in certain muscles. This weakness can include difficulty lifting your leg or arm, difficulty standing on your toes on one side, or difficulty squeezing tightly with one of your hands. You may have numbness. You may feel a mild tingling, dull ache, or a burning or pulsating pain. In some  cases, the pain is severe enough that you are unable to move. The pain most often occurs on one side of the body. The pain often starts slowly. It may get worse:  After you sit or stand.  At night.  When you sneeze, cough, or laugh.  When you bend backwards or walk more than a few yards. The pain, numbness, or weakness will often go away or improve a lot over a period of weeks to months. Herniated lumbar disk Symptoms of a herniated lumbar disk may include sharp pain in one part of your leg, hip, or buttocks and numbness in other parts. You also may feel pain or numbness on the back of your calf or the top or sole of your foot. The same leg also may feel weak. Herniated cervical disk Symptoms of a herniated cervical disk may include pain when you move your neck, deep pain near or over your shoulder blade, or pain that moves to your upper arm, forearm, or fingers. DIAGNOSIS  To diagnose a herniated disk, your caregiver will perform a physical exam. Your caregiver also may perform diagnostic tests to see your disk or to test the reaction of your muscles and the function of your nerves. During the physical exam, your caregiver may ask you to:  Sit, stand, and walk. While you walk, your caregiver may ask you to try walking on your toes and then your heels.  Bend forward, backward, and sideways.  Raise your shoulders, elbow, wrist, and  fingers and check your strength during these tasks. Your caregiver will check for:  Numbness or loss of feeling.  Muscle reflexes, which may be slower or missing.  Muscle strength, which may be weaker.  Posture or the way your spine curves. Diagnostic tests that may be done include:  A spinal X-ray exam to rule out other causes of back pain.  Magnetic resonance imaging (MRI) or computed tomography (CT) scan, which will show if the herniated disk is pressing on your spinal canal.  Electromyography. This is sometimes used to identify the specific area of  nerve involvement. TREATMENT  Initial treatment for a herniated disk is a short period of rest with medicines for pain. Pain medicines can include nonsteroidal anti-inflammatory medicines (NSAIDs), muscle relaxants for back spasms, and (rarely) narcotic pain medicine for severe pain that does not respond to NSAID use. Bed rest is often limited to 1 or 2 days at the most because prolonged rest can delay recovery. When the herniation involves the lower back, sitting should be avoided as much as possible because sitting increases pressure on the ruptured disk. Sometimes a soft neck collar will be prescribed for a few days to weeks to help support your neck in the case of a cervical herniation. Physical therapy is often prescribed for patients with disk disease. Physical therapists will teach you how to properly lift, dress, walk, and perform other activities. They will work on strengthening the muscles that help support your spine. In some cases, physical therapy alone is not enough to treat a herniated disk. Steroid injections along the involved nerve root may be needed to help control pain. The steroid is injected in the area of the herniated disk and helps by reducing swelling around the disk. Sometimes surgery is the best option to treat a herniated disk.  SEEK IMMEDIATE MEDICAL CARE IF:   You have numbness, tingling, weakness, or problems with the use of your arms or legs.  You have severe headaches that are not relieved with the use of medicines.  You notice a change in your bowel or bladder control.  You have increasing pain in any areas of your body.  You experience shortness of breath, dizziness, or fainting. MAKE SURE YOU:   Understand these instructions.  Will watch your condition.  Will get help right away if you are not doing well or get worse. Document Released: 03/07/2000 Document Revised: 06/02/2011 Document Reviewed: 10/11/2010 [facility] Patient Information 2014 [place],  [place].

## 2013-03-10 NOTE — Progress Notes (Signed)
Subjective:    Patient ID: Trevor Mendez, male    DOB: 25-Jan-1981, 32 y.o.   MRN: [id number]  HPI [name] present for evaluation of back pain. He woke up "Sunday AM with back pain. This got worse through the day. Left work Monday mid-day due to pain. The pain is lower right side and there is now radiation of pain down the right leg and across to the left lower back. He has had several episode of paresthesia/numbness right leg. No incontinence of bowel or bladder.  Never had pain like this before. He does recall moving furniture, including heavy objects, on Saturday.  Past Medical History  Diagnosis Date  . MIGRAINE HEADACHE 04/10/2007    Qualifier: Diagnosis of  By: Shoffner CMA, [place]    . DEPRESSION, MAJOR, SEVERE 09/13/2009    Qualifier: Diagnosis of  By: Lexine Jaspers MD, Johnte Portnoy E   . HTN (hypertension) 10/09/2011   Past Surgical History  Procedure Laterality Date  . Finger surgury    . Orif ankle fracture Right   . Wisdom tooth extraction     Family History  Problem Relation Age of Onset  . Hypertension Mother   . Hypertension Father   . Alcohol abuse Father   . Alcohol abuse Paternal Aunt   . Alcohol abuse Paternal Uncle   . Alcohol abuse Paternal Grandfather   . Schizophrenia Paternal Grandfather   . Alcohol abuse Paternal Grandmother    History   Social History  . Marital Status: Married    Spouse Name: N/A    Number of Children: 1  . Years of Education: 14   Occupational History  . machinist [employer]   Social History Main Topics  . Smoking status: Former Smoker  . Smokeless tobacco: Current User    Types: Chew  . Alcohol Use: 0.0 oz/week    70-80 Cans of beer per week     Comment: everydayl last drink 7 days ago  . Drug Use: 30.00 per week    Special: Benzodiazepines  . Sexual Activity: Yes    Partners: Female   Other Topics Concern  . 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 Married -'11(?). 1 dtr - '12. Work -  machinist.      06" /13/2014 [name] was born and grew up in [place], [place]. He has a younger sister. His parents are still together and healthy. He graduated from high school, and has all but a degree at [employer] in Musician. 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 He has been married for 5 years, and has an 36-month-old daughter. He currently lives with his wife and daughter. 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 His social support system consists of his sponsor, wife, mother, father, sister, brother-in-law, and friends. He affiliates as a International aid/development worker. He denies any legal problems. He enjoys golf and firearms. 09/03/2012 AHW    Current Outpatient Prescriptions on File Prior to Visit  Medication Sig Dispense Refill  . amLODipine (NORVASC) 5 MG tablet TAKE 1 TABLET BY MOUTH DAILY  90 tablet  3  . FLUoxetine (PROZAC) 20 MG capsule TAKE ONE CAPSULE BY MOUTH TWICE A DAY  60 capsule  3  . hydrochlorothiazide (MICROZIDE) 12.5 MG capsule TAKE ONE CAPSULE BY MOUTH EVERY DAY  30 capsule  11   No current facility-administered medications on file prior to visit.      Review  of Systems System review is negative for any constitutional, cardiac, pulmonary, GI or neuro symptoms or complaints other than as described in the HPI.     Objective:   Physical Exam Filed Vitals:   03/10/13 1607  BP: 120/92  Pulse: 65  Temp: 98.6 F (37 C)   Cor- RRR Pulm - normal MSK - Back exam: normal stand w/ effort and pain; abnormal flex to 30 degrees; abnormal gait; abnormal toe/heel walk could not raise up on his heels; abnormal step up to exam table with right leg; abnormal SLR sitting and laying down - 30 degrees; normal DTRs at the patellar tendons; abnormal sensation to light touch lateral right leg,decreased sensatilon to pin-prick lateral right ankle and normal deep vibratory stimulus; no  CVA tenderness; unable to move supine to  sitting without assistance-had to lay on his side.  L-S spine films: LUMBAR SPINE - COMPLETE 4+ VIEW  COMPARISON: 06/09/2005 and  FINDINGS:  There is no evidence of lumbar spine fracture. Alignment is normal.  Intervertebral disc spaces are maintained.  IMPRESSION:  Negative.       Assessment & Plan:  Back pain lumbar spine - worrisome for a herniated disk.  Plan X-rays this evening, if abnormal will be setting up an MRI lumbar spine on an urgent basis  Ketorolac 10 mg three times a day - a non narcotic pain medication  Flexeril 10 mg three times a day - a muscle relaxant to control muscle spasms - watch for drowsiness  Prednisone - burst and taper - to reduce inflammation and swelling as a way to relieve pain  Out of work until Monday  Addendum:  no evidence radiographically of DDD despite a worrisome exam.

## 2013-03-10 NOTE — Progress Notes (Signed)
Pre visit review using our clinic review tool, if applicable. No additional management support is needed unless otherwise documented below in the visit note. 

## 2013-03-31 ENCOUNTER — Other Ambulatory Visit: Payer: Self-pay | Admitting: Internal Medicine

## 2013-06-21 ENCOUNTER — Other Ambulatory Visit: Payer: Self-pay | Admitting: Internal Medicine

## 2013-09-29 ENCOUNTER — Telehealth: Payer: Self-pay | Admitting: Internal Medicine

## 2013-09-29 NOTE — Telephone Encounter (Signed)
Received 5 pages from [facility], sent to Dr. Debby BudNorins on 09/29/13/ss.

## 2013-10-14 ENCOUNTER — Other Ambulatory Visit: Payer: Self-pay | Admitting: Internal Medicine

## 2013-11-16 ENCOUNTER — Other Ambulatory Visit: Payer: Self-pay

## 2013-11-16 NOTE — Telephone Encounter (Signed)
Denied refill, pt needs to est. With new PCP

## 2013-12-29 ENCOUNTER — Telehealth: Payer: Self-pay | Admitting: Internal Medicine

## 2013-12-29 NOTE — Telephone Encounter (Signed)
Received 4 pages from [facility], sent to Primary Care, because Dr. Debby BudNorins has retired. 12/29/13/ss

## 2014-06-24 ENCOUNTER — Telehealth: Payer: Self-pay | Admitting: Internal Medicine

## 2014-06-24 ENCOUNTER — Encounter: Payer: Self-pay | Admitting: Family Medicine

## 2014-06-24 ENCOUNTER — Ambulatory Visit (INDEPENDENT_AMBULATORY_CARE_PROVIDER_SITE_OTHER): Payer: Managed Care, Other (non HMO) | Admitting: Family Medicine

## 2014-06-24 VITALS — BP 130/90 | HR 97 | Temp 98.5°F | Ht 70.0 in | Wt 159.8 lb

## 2014-06-24 DIAGNOSIS — R42 Dizziness and giddiness: Secondary | ICD-10-CM | POA: Diagnosis not present

## 2014-06-24 DIAGNOSIS — J0110 Acute frontal sinusitis, unspecified: Secondary | ICD-10-CM | POA: Diagnosis not present

## 2014-06-24 DIAGNOSIS — T148 Other injury of unspecified body region: Secondary | ICD-10-CM | POA: Diagnosis not present

## 2014-06-24 DIAGNOSIS — W57XXXA Bitten or stung by nonvenomous insect and other nonvenomous arthropods, initial encounter: Secondary | ICD-10-CM

## 2014-06-24 DIAGNOSIS — J322 Chronic ethmoidal sinusitis: Secondary | ICD-10-CM | POA: Insufficient documentation

## 2014-06-24 DIAGNOSIS — J019 Acute sinusitis, unspecified: Secondary | ICD-10-CM | POA: Insufficient documentation

## 2014-06-24 MED ORDER — DOXYCYCLINE HYCLATE 100 MG PO TABS: 100.0000 mg | ORAL_TABLET | Freq: Two times a day (BID) | ORAL | Status: DC

## 2014-06-24 MED ORDER — MECLIZINE HCL 25 MG PO TABS: 25.0000 mg | ORAL_TABLET | Freq: Three times a day (TID) | ORAL | Status: DC | PRN

## 2014-06-24 NOTE — Progress Notes (Signed)
Pre visit review using our clinic review tool, if applicable. No additional management support is needed unless otherwise documented below in the visit note. 

## 2014-06-24 NOTE — Telephone Encounter (Signed)
[facility] & [facility]

## 2014-06-24 NOTE — Assessment & Plan Note (Signed)
Abx, nasal steroid, and antihistamine F/u pcp prn

## 2014-06-24 NOTE — Progress Notes (Signed)
Subjective:    Patient ID: Trevor Mendez, male    DOB: 01/03/1981, 34 y.o.   MRN: [id number]  HPI  Patient here for dizziness and ear fullness x 3 weeks -- he was also bite by a tick 2x at that time.  No fever, chills. No rash.  No otc meds.    Past Medical History  Diagnosis Date  . MIGRAINE HEADACHE 04/10/2007    Qualifier: Diagnosis of  By: Genelle Gather CMA, [place]    . DEPRESSION, MAJOR, SEVERE 09/13/2009    Qualifier: Diagnosis of  By: Debby Bud MD, Rosalyn Gess   . HTN (hypertension) 10/09/2011    Review of Systems  Constitutional: Negative for fever, chills, fatigue and unexpected weight change.  HENT: Positive for ear pain and sinus pressure. Negative for hearing loss, postnasal drip, rhinorrhea and sore throat.   Respiratory: Negative for cough, shortness of breath and wheezing.   Cardiovascular: Negative for chest pain and palpitations.  Musculoskeletal: Negative for myalgias, back pain, joint swelling, arthralgias, gait problem and neck pain.  Skin: Negative for color change and rash.  Psychiatric/Behavioral: Negative for hallucinations, sleep disturbance, self-injury and decreased concentration. The patient is not nervous/anxious and is not hyperactive.     Current Outpatient Prescriptions on File Prior to Visit  Medication Sig Dispense Refill  . FLUoxetine (PROZAC) 20 MG capsule TAKE ONE CAPSULE BY MOUTH TWICE A DAY (Patient taking differently: TAKE ONE ( ) CAPSULE BY MOUTH TWICE A DAY) 60 capsule 3  . lamoTRIgine (LAMICTAL) 100 MG tablet Take 200 mg by mouth daily.      No current facility-administered medications on file prior to visit.       Objective:    Physical Exam  Constitutional: He appears well-developed and well-nourished. No distress.  HENT:  Head: Normocephalic and atraumatic.  Right Ear: Tympanic membrane normal.  Left Ear: Tympanic membrane normal.  Nose: Mucosal edema and rhinorrhea present. Right sinus exhibits maxillary sinus tenderness and  frontal sinus tenderness. Left sinus exhibits maxillary sinus tenderness and frontal sinus tenderness.  Mouth/Throat: Mucous membranes are normal. Oropharyngeal exudate and posterior oropharyngeal erythema present. No posterior oropharyngeal edema.  + PND  Eyes: Conjunctivae and EOM are normal. Pupils are equal, round, and reactive to light.  Neck: Normal range of motion. Neck supple.  Cardiovascular: Normal rate, regular rhythm and normal heart sounds.   Pulmonary/Chest: Effort normal and breath sounds normal. No respiratory distress. He has no wheezes.  + hacking cough  Lymphadenopathy:    He has no cervical adenopathy.  Skin: Skin is warm and dry.  + tick bites x 2 on upper r side back Warm to touch No bullseye No other rashes    BP 130/90 mmHg  Pulse 97  Temp(Src) 98.5 F (36.9 C) (Oral)  Ht  (1.778 m)  Wt 159 lb 12 oz (72.462 kg)  BMI 22.92 kg/m2  SpO2 95% Wt Readings from Last 3 Encounters:  06/24/14 159 lb 12 oz (72.462 kg)  03/10/13 160 lb 12.8 oz (72.938 kg)  11/30/12 152 lb (68.947 kg)     Lab Results  Component Value Date   WBC 6.9 08/25/2012   HGB 15.0 11/30/2012   HCT 44.8 11/30/2012   PLT 270 08/25/2012   GLUCOSE 87 11/30/2012   CHOL 226* 11/30/2012   TRIG 155.0* 11/30/2012   HDL 47.80 11/30/2012   LDLDIRECT 159.6 11/30/2012   ALT 45 11/30/2012   ALT 45 11/30/2012   AST 28 11/30/2012   AST 28 11/30/2012  NA 137 11/30/2012   K 3.6 11/30/2012   CL 101 11/30/2012   CREATININE 0.8 11/30/2012   BUN 7 11/30/2012   CO2 28 11/30/2012       Assessment & Plan:   Problem List Items Addressed This Visit    Sinusitis, acute - Primary    Abx, nasal steroid, and antihistamine F/u pcp prn        Relevant Medications   doxycycline (VIBRA-TABS) 100 MG tablet    Other Visit Diagnoses    Tick bite        Relevant Medications    doxycycline (VIBRA-TABS) 100 MG tablet    Vertigo        Relevant Medications    meclizine (ANTIVERT) tablet        I have discontinued Mr. Brauer's hydrochlorothiazide, ketorolac, cyclobenzaprine, predniSONE, and amLODipine. I am also having him maintain his lamoTRIgine, FLUoxetine, meclizine, and doxycycline.  Meds ordered this encounter  Medications  . DISCONTD: doxycycline (VIBRA-TABS) 100 MG tablet    Sig: Take 1 tablet (100 mg total) by mouth 2 (two) times daily.    Dispense:  20 tablet    Refill:  0  . DISCONTD: meclizine (ANTIVERT) 25 MG tablet    Sig: Take 1 tablet (25 mg total) by mouth 3 (three) times daily as needed for dizziness.    Dispense:  30 tablet    Refill:  0  . meclizine (ANTIVERT) 25 MG tablet    Sig: Take 1 tablet (25 mg total) by mouth 3 (three) times daily as needed for dizziness.    Dispense:  30 tablet    Refill:  0  . doxycycline (VIBRA-TABS) 100 MG tablet    Sig: Take 1 tablet (100 mg total) by mouth 2 (two) times daily.    Dispense:  20 tablet    Refill:  0     Loreen FreudYvonne Lowne, DO

## 2014-06-24 NOTE — Telephone Encounter (Signed)
Patient wants prescriptions send to [facility] [address] [facility] [place].

## 2014-06-24 NOTE — Patient Instructions (Signed)
Get one of the steroid nasal sprays otc--- rhinocort, nasacort or flonase and and antihistamine ie-zyrtec, allegra or claritin If no better call office Monday for appointment If symptoms worsen over weekend or rash develops-- go to ER

## 2015-05-29 ENCOUNTER — Emergency Department (HOSPITAL_COMMUNITY)
Admission: EM | Admit: 2015-05-29 | Discharge: 2015-05-29 | Disposition: A | Discharge status: Home / Self Care | Payer: Managed Care, Other (non HMO) | Attending: Emergency Medicine | Admitting: Emergency Medicine

## 2015-05-29 ENCOUNTER — Emergency Department (HOSPITAL_COMMUNITY): Payer: Managed Care, Other (non HMO)

## 2015-05-29 ENCOUNTER — Encounter (HOSPITAL_COMMUNITY): Payer: Self-pay | Admitting: Emergency Medicine

## 2015-05-29 DIAGNOSIS — Z79899 Other long term (current) drug therapy: Secondary | ICD-10-CM | POA: Diagnosis not present

## 2015-05-29 DIAGNOSIS — G43909 Migraine, unspecified, not intractable, without status migrainosus: Secondary | ICD-10-CM | POA: Diagnosis not present

## 2015-05-29 DIAGNOSIS — F329 Major depressive disorder, single episode, unspecified: Secondary | ICD-10-CM | POA: Insufficient documentation

## 2015-05-29 DIAGNOSIS — Z9889 Other specified postprocedural states: Secondary | ICD-10-CM | POA: Insufficient documentation

## 2015-05-29 DIAGNOSIS — Z792 Long term (current) use of antibiotics: Secondary | ICD-10-CM | POA: Insufficient documentation

## 2015-05-29 DIAGNOSIS — I10 Essential (primary) hypertension: Secondary | ICD-10-CM | POA: Insufficient documentation

## 2015-05-29 DIAGNOSIS — M7989 Other specified soft tissue disorders: Secondary | ICD-10-CM | POA: Diagnosis not present

## 2015-05-29 DIAGNOSIS — M79671 Pain in right foot: Secondary | ICD-10-CM | POA: Diagnosis present

## 2015-05-29 DIAGNOSIS — Z87891 Personal history of nicotine dependence: Secondary | ICD-10-CM | POA: Insufficient documentation

## 2015-05-29 MED ORDER — IBUPROFEN 600 MG PO TABS: 600.0000 mg | ORAL_TABLET | Freq: Four times a day (QID) | ORAL | Status: DC | PRN

## 2015-05-29 MED ORDER — HYDROCODONE-ACETAMINOPHEN 5-325 MG PO TABS
1.0000 | ORAL_TABLET | Freq: Once | ORAL | Status: AC
  Administered 2015-05-29: 1 via ORAL
  Filled 2015-05-29: qty 1

## 2015-05-29 MED ORDER — IBUPROFEN 400 MG PO TABS
600.0000 mg | ORAL_TABLET | Freq: Once | ORAL | Status: AC
  Administered 2015-05-29: 600 mg via ORAL
  Filled 2015-05-29: qty 1

## 2015-05-29 NOTE — ED Provider Notes (Signed)
CSN: [id number]     Arrival date & time 05/29/15  0116 History  By signing my name below, I, Trevor Mendez, attest that this documentation has been prepared under the direction and in the presence of Shon Baton, MD. Electronically Signed: [name], ED Scribe. 05/29/2015. 3:45 AM   Chief Complaint  Patient presents with  . Foot Pain   The history is provided by the patient. No language interpreter was used.   Trevor Mendez is a 35 y.o. male who presents to the Emergency Department complaining of new, atraumatic, constant, 6/10 in severity right foot pain and swelling with sudden onset last night at work while walking. The pain is exacerbated by weight bearing. He took nothing for pain at home. His shoes are not new and he can think of no reason for the pain. He states that he had surgery on the right ankle several years ago but has never had pain like this. No history of gout.  Pt denies frequent alcohol use or pork consumption.   Past Medical History  Diagnosis Date  . MIGRAINE HEADACHE 04/10/2007    Qualifier: Diagnosis of  By: Genelle Gather CMA, [place]    . DEPRESSION, MAJOR, SEVERE 09/13/2009    Qualifier: Diagnosis of  By: Debby Bud MD, Rosalyn Gess   . HTN (hypertension) 10/09/2011   Past Surgical History  Procedure Laterality Date  . Finger surgury    . Orif ankle fracture Right   . Wisdom tooth extraction     Family History  Problem Relation Age of Onset  . Hypertension Mother   . Hypertension Father   . Alcohol abuse Father   . Alcohol abuse Paternal Aunt   . Alcohol abuse Paternal Uncle   . Alcohol abuse Paternal Grandfather   . Schizophrenia Paternal Grandfather   . Alcohol abuse Paternal Grandmother    Social History  Substance Use Topics  . Smoking status: Former Games developer  . Smokeless tobacco: Current User    Types: Chew  . Alcohol Use: Yes    Review of Systems  Musculoskeletal:       Right foot pain and swelling  All other systems reviewed and are  negative.  Allergies  Codeine  Home Medications   Prior to Admission medications   Medication Sig Start Date End Date Taking? Authorizing Provider  doxycycline (VIBRA-TABS) 100 MG tablet Take 1 tablet (100 mg total) by mouth 2 (two) times daily. 06/24/14   Grayling Congress Lowne, DO  FLUoxetine (PROZAC) 20 MG capsule TAKE ONE CAPSULE BY MOUTH TWICE A DAY Patient taking differently: TAKE ONE ( ) CAPSULE BY MOUTH TWICE A DAY 06/21/13   Corwin Levins, MD  ibuprofen (ADVIL,MOTRIN) 600 MG tablet Take 1 tablet (600 mg total) by mouth every 6 (six) hours as needed. 05/29/15   Shon Baton, MD  lamoTRIgine (LAMICTAL) 100 MG tablet Take 200 mg by mouth daily.     Historical Provider, MD  meclizine (ANTIVERT) 25 MG tablet Take 1 tablet (25 mg total) by mouth 3 (three) times daily as needed for dizziness. 06/24/14   Yvonne R Lowne, DO   BP 137/96 mmHg  Pulse 71  Temp(Src) 98.8 F (37.1 C) (Oral)  Resp 14  Ht  (1.778 m)  Wt 159 lb (72.122 kg)  BMI 22.81 kg/m2  SpO2 98% Physical Exam  Constitutional: He is oriented to person, place, and time. He appears well-developed and well-nourished. No distress.  HENT:  Head: Normocephalic and atraumatic.  Cardiovascular: Normal rate, regular rhythm  and normal heart sounds.   Pulmonary/Chest: Effort normal. No respiratory distress.  Musculoskeletal: He exhibits no edema.  No obvious edema or deformity of the right foot, mild tenderness to palpation over the dorsum of the right foot, 2+ DP pulse, no overlying skin changes  Neurological: He is alert and oriented to person, place, and time.  Skin: Skin is warm and dry.  Psychiatric: He has a normal mood and affect.  Nursing note and vitals reviewed.   ED Course  Procedures (including critical care time) DIAGNOSTIC STUDIES: Oxygen Saturation is 98% on RA,  normal by my interpretation.    COORDINATION OF CARE: 3:43 AM Discussed treatment plan which includes right foot XR with pt at bedside and pt agreed  to plan.  Labs Review Labs Reviewed - No data to display  Imaging Review Dg Foot Complete Right  05/29/2015  CLINICAL DATA:  Right foot pain for 1 day.  Previous surgery. EXAM: RIGHT FOOT COMPLETE - 3+ VIEW COMPARISON:  None. FINDINGS: There is no evidence of fracture or dislocation. There is no evidence of arthropathy or other focal bone abnormality. Soft tissues are unremarkable. IMPRESSION: Negative. Electronically Signed   By: Burman NievesWilliam  Stevens M.D.   On: 05/29/2015 02:16   I have personally reviewed and evaluated these images as part of my medical decision-making.   EKG Interpretation None      MDM   Final diagnoses:  Right foot pain    Patient present with right foot pain. No objective abnormality on exam. He does have mild tenderness to palpation. It is over the dorsum of the foot. Denies injury. Plain films are negative. Patient was given ibuprofen and Norco. Discussed with patient that pain may be related to an ill fitting shoe or a stress fracture that is not able to be seen on x-ray. Recommended rest, ice, compression, and elevation. Anti-inflammatory medications. Follow with primary physician if pain persists.  After history, exam, and medical workup I feel the patient has been appropriately medically screened and is safe for discharge home. Pertinent diagnoses were discussed with the patient. Patient was given return precautions.  I personally performed the services described in this documentation, which was scribed in my presence. The recorded information has been reviewed and is accurate.    Shon Batonourtney F Jauna Raczynski, MD 05/29/15 [phone]

## 2015-05-29 NOTE — ED Notes (Signed)
No e-sig available, pt verbalized understanding DC instructions and prescriptions

## 2015-05-29 NOTE — ED Notes (Signed)
Pt ambulated independently with no difficulty. 

## 2015-05-29 NOTE — Discharge Instructions (Signed)
You were seen today for foot pain. Your x-rays are normal. There are no signs and symptoms of infection. And her exam is otherwise normal. You could have a stress fracture; however, your story is not consistent with this. Rest, ice, compression, and elevation. Ibuprofen at home for pain management. Follow-up with primary physician if symptoms persist.

## 2015-05-29 NOTE — ED Notes (Signed)
Pt. 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

## 2016-02-08 ENCOUNTER — Ambulatory Visit (INDEPENDENT_AMBULATORY_CARE_PROVIDER_SITE_OTHER): Payer: Managed Care, Other (non HMO) | Admitting: Physician Assistant

## 2016-02-08 ENCOUNTER — Encounter: Payer: Self-pay | Admitting: Physician Assistant

## 2016-02-08 VITALS — BP 114/80 | HR 58 | Temp 97.8°F | Resp 14 | Ht 70.0 in | Wt 159.0 lb

## 2016-02-08 DIAGNOSIS — B9689 Other specified bacterial agents as the cause of diseases classified elsewhere: Secondary | ICD-10-CM

## 2016-02-08 DIAGNOSIS — J019 Acute sinusitis, unspecified: Secondary | ICD-10-CM

## 2016-02-08 MED ORDER — DOXYCYCLINE HYCLATE 100 MG PO CAPS: 100.0000 mg | ORAL_CAPSULE | Freq: Two times a day (BID) | ORAL | 0 refills | Status: DC

## 2016-02-08 NOTE — Progress Notes (Signed)
Pre visit review using our clinic review tool, if applicable. No additional management support is needed unless otherwise documented below in the visit note. 

## 2016-02-08 NOTE — Progress Notes (Signed)
Patient presents to clinic today as transfer of care from Dr. Debby Mendez who has retired. Patient has acute concerns today. Patient endorses over 2 weeks of head congestion with ear pressure/ear pain, PND and nausea. Endorses dry cough mostly, sometimes productive but only in the morning. Notes fatigue. Endorses intermittent subjective fevers. None recently. Endorses maxillary sinus pressure and pain. Denies significant history of URI or sinusitis.   Past Medical History:  Diagnosis Date  . DEPRESSION, MAJOR, SEVERE 09/13/2009   Qualifier: Diagnosis of  By: Trevor BudNorins MD, Trevor GessMichael E   . History of alcohol abuse   . HTN (hypertension) 10/09/2011  . MIGRAINE HEADACHE 04/10/2007   Qualifier: Diagnosis of  By: Trevor GatherShoffner CMA, [place]      No current outpatient prescriptions on file prior to visit.   No current facility-administered medications on file prior to visit.     Allergies  Allergen Reactions  . Codeine Nausea And Vomiting    Family History  Problem Relation Age of Onset  . Hypertension Mother   . Hypertension Father   . Alcohol abuse Father   . Alcohol abuse Paternal Aunt   . Alcohol abuse Paternal Uncle   . Alcohol abuse Paternal Grandfather   . Schizophrenia Paternal Grandfather   . Alcohol abuse Paternal Grandmother     Social History   Social History  . Marital status: Married    Spouse name: N/A  . Number of children: 1  . Years of education: 5714   Occupational History  . machinist [employer]   Social History Main Topics  . Smoking status: Former Games developermoker  . Smokeless tobacco: Current User    Types: Chew  . 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   . Drug use: No     Comment: previous benzo addiction  . Sexual activity: Yes    Partners: Female   Other Topics Concern  . 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 Married -'11(?). 1 dtr - '12. Work - Chartered certified accountantmachinist.      09/03/2012 AHW [name] He has a younger sister. His parents are still together and healthy. He graduated from high school, and has all but a degree at [employer] in Musicianmachining. 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 He has been married for 5 years, and has an 4859-month-old daughter. He currently lives with his wife and daughter. 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 His social support system consists of his sponsor, wife, mother, father, sister, brother-in-law, and friends. 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 He denies any legal problems. He enjoys golf and firearms. 09/03/2012 AHW   Review of Systems - See HPI.  All other ROS are negative.  BP 114/80   Pulse (!) 58   Temp 97.8 F (36.6 C) (Oral)   Resp 14   Ht 5\' 10"  (1.778 m)   Wt 159 lb (72.1 kg)   SpO2 98%   BMI 22.81 kg/m   Physical Exam  Constitutional: He is oriented to person, place, and time and well-developed, well-nourished, and in no distress.  HENT:  Head: Normocephalic and atraumatic.  Right Ear: Tympanic membrane normal.  Left Ear: Tympanic membrane normal.  Nose: Right sinus exhibits maxillary sinus tenderness. Left sinus exhibits maxillary sinus tenderness.  Mouth/Throat: Uvula is midline, oropharynx is clear and moist and mucous membranes  are normal.  Eyes: Conjunctivae are normal.  Neck: Neck supple.  Cardiovascular: Normal rate, regular rhythm, normal heart sounds and intact distal pulses.   Pulmonary/Chest: Effort normal and breath sounds normal. No respiratory distress. He has no wheezes. He has no rales. He exhibits no tenderness.  Neurological: He is alert and oriented to person, place, and time.  Skin: Skin is warm and dry. No rash noted.  Psychiatric: Affect normal.  Vitals reviewed.   Assessment/Plan: 1. Acute bacterial sinusitis Rx Doxycycline.  Increase fluids.  Rest.  Saline nasal spray.  Probiotic.  Mucinex as directed.  Humidifier in  bedroom.  Call or return to clinic if symptoms are not improving.  - doxycycline (VIBRAMYCIN) 100 MG capsule; Take 1 capsule (100 mg total) by mouth 2 (two) times daily.  Dispense: 20 capsule; Refill: 0   Trevor ClimesMartin, Trevor Mendez, [place] JerseyPA-C

## 2016-02-08 NOTE — Patient Instructions (Signed)
Please take antibiotic as directed.  Increase fluid intake.  Use Saline nasal spray.  Take a daily multivitamin. Mucinex-DM for cough and congestion.  Place a humidifier in the bedroom.  Please call or return clinic if symptoms are not improving.  Sinusitis Sinusitis is redness, soreness, and swelling (inflammation) of the paranasal sinuses. Paranasal sinuses are air pockets within the bones of your face (beneath the eyes, the middle of the forehead, or above the eyes). In healthy paranasal sinuses, mucus is able to drain out, and air is able to circulate through them by way of your nose. However, when your paranasal sinuses are inflamed, mucus and air can become trapped. This can allow bacteria and other germs to grow and cause infection. Sinusitis can develop quickly and last only a short time (acute) or continue over a long period (chronic). Sinusitis that lasts for more than 12 weeks is considered chronic.  CAUSES  Causes of sinusitis include:  Allergies.  Structural abnormalities, such as displacement of the cartilage that separates your nostrils (deviated septum), which can decrease the air flow through your nose and sinuses and affect sinus drainage.  Functional abnormalities, such as when the small hairs (cilia) that line your sinuses and help remove mucus do not work properly or are not present. SYMPTOMS  Symptoms of acute and chronic sinusitis are the same. The primary symptoms are pain and pressure around the affected sinuses. Other symptoms include:  Upper toothache.  Earache.  Headache.  Bad breath.  Decreased sense of smell and taste.  A cough, which worsens when you are lying flat.  Fatigue.  Fever.  Thick drainage from your nose, which often is green and may contain pus (purulent).  Swelling and warmth over the affected sinuses. DIAGNOSIS  Your caregiver will perform a physical exam. During the exam, your caregiver may:  Look in your nose for signs of abnormal  growths in your nostrils (nasal polyps).  Tap over the affected sinus to check for signs of infection.  View the inside of your sinuses (endoscopy) with a special imaging device with a light attached (endoscope), which is inserted into your sinuses. If your caregiver suspects that you have chronic sinusitis, one or more of the following tests may be recommended:  Allergy tests.  Nasal culture A sample of mucus is taken from your nose and sent to a lab and screened for bacteria.  Nasal cytology A sample of mucus is taken from your nose and examined by your caregiver to determine if your sinusitis is related to an allergy. TREATMENT  Most cases of acute sinusitis are related to a viral infection and will resolve on their own within 10 days. Sometimes medicines are prescribed to help relieve symptoms (pain medicine, decongestants, nasal steroid sprays, or saline sprays).  However, for sinusitis related to a bacterial infection, your caregiver will prescribe antibiotic medicines. These are medicines that will help kill the bacteria causing the infection.  Rarely, sinusitis is caused by a fungal infection. In theses cases, your caregiver will prescribe antifungal medicine. For some cases of chronic sinusitis, surgery is needed. Generally, these are cases in which sinusitis recurs more than 3 times per year, despite other treatments. HOME CARE INSTRUCTIONS   Drink plenty of water. Water helps thin the mucus so your sinuses can drain more easily.  Use a humidifier.  Inhale steam 3 to 4 times a day (for example, sit in the bathroom with the shower running).  Apply a warm, moist washcloth to your face 3  to 4 times a day, or as directed by your caregiver.  Use saline nasal sprays to help moisten and clean your sinuses.  Take over-the-counter or prescription medicines for pain, discomfort, or fever only as directed by your caregiver. SEEK IMMEDIATE MEDICAL CARE IF:  You have increasing pain or  severe headaches.  You have nausea, vomiting, or drowsiness.  You have swelling around your face.  You have vision problems.  You have a stiff neck.  You have difficulty breathing. MAKE SURE YOU:   Understand these instructions.  Will watch your condition.  Will get help right away if you are not doing well or get worse. Document Released: 03/10/2005 Document Revised: 06/02/2011 Document Reviewed: 03/25/2011 ExitCare Patient Information 2014 [employer].   

## 2016-03-05 ENCOUNTER — Encounter (INDEPENDENT_AMBULATORY_CARE_PROVIDER_SITE_OTHER): Payer: Self-pay | Admitting: Orthopedic Surgery

## 2016-03-05 ENCOUNTER — Ambulatory Visit (INDEPENDENT_AMBULATORY_CARE_PROVIDER_SITE_OTHER): Payer: Self-pay

## 2016-03-05 ENCOUNTER — Ambulatory Visit (INDEPENDENT_AMBULATORY_CARE_PROVIDER_SITE_OTHER): Payer: Managed Care, Other (non HMO) | Admitting: Orthopedic Surgery

## 2016-03-05 VITALS — Ht 69.0 in | Wt 155.0 lb

## 2016-03-05 DIAGNOSIS — M25571 Pain in right ankle and joints of right foot: Secondary | ICD-10-CM

## 2016-03-05 DIAGNOSIS — S93411A Sprain of calcaneofibular ligament of right ankle, initial encounter: Secondary | ICD-10-CM

## 2016-03-05 NOTE — Progress Notes (Signed)
Office Visit Note   Patient: Trevor Mendez           Date of Birth: 07/18/1980           MRN: [id number] Visit Date: 03/05/2016              Requested by: Waldon MerlWilliam C Martin, PA-C [address] [place], [place] [id number] PCP: Piedad ClimesMartin, William Cody, PA-C   Assessment & Plan: Visit Diagnoses:  1. Pain in right ankle and joints of right foot   2. 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 He is given a fracture boot he will wear this for 2-3 weeks if he is still symptomatic in 3 weeks he will follow-up.  Follow-Up Instructions: Return if symptoms worsen or fail to improve.   Orders:  Orders Placed This Encounter  Procedures  . XR Foot Complete Right  . XR Ankle Complete Right   No orders of the defined types were placed in this encounter.     Procedures: No procedures performed   Clinical Data: No additional findings.   Subjective: Chief Complaint  Patient presents with  . Right Foot - Injury  . 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 He turned ankle over and forward and heard a loud pop. There is swelling, bruising and acute pain. He is taking advil currently for his pain.     Review of Systems   Objective: Vital Signs: Ht 5\' 9"  (1.753 m)   Wt 155 lb (70.3 kg)   BMI 22.89 kg/m   Physical Exam on examination patient is alert oriented no adenopathy well-dressed the left respiratory effort he does have an antalgic gait. Examination of the right ankle he has a good dorsalis pedis pulse he has ecchymosis and bruising both over the deltoid and the lateral ankle ligaments these areas are tender to palpation. The Achilles is nontender to palpation no palpable defect. He has good range of motion of the ankle and subtalar joint. He has negative anterior drawer. No tenderness to palpation  proximally over the syndesmosis or over the proximal tibia or fibula.  Ortho Exam  Specialty Comments:  No specialty comments available.  Imaging: Xr Ankle Complete Right  Result Date: 03/05/2016 Two-view radiographs of the right ankle shows a normal mortise no widening of the mortise no osteochondral defect no evidence of the fibular fracture.  Xr Foot Complete Right  Result Date: 03/05/2016 Two-view radiographs of the right foot shows no evidence of fractureof the fifth metatarsal fracture.    PMFS History: Patient Active Problem List   Diagnosis Date Noted  . Ethmoid sinusitis 06/24/2014  . Sinusitis, acute 06/24/2014  . Health maintenance alteration 11/30/2012  . Alcohol dependence (HCC) 09/02/2012  . 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 Generalized anxiety disorder 08/27/2012  . HTN (hypertension) 10/09/2011  . DEPRESSION, MAJOR, SEVERE 09/13/2009  . MIGRAINE HEADACHE 04/10/2007   Past Medical History:  Diagnosis Date  . DEPRESSION, MAJOR, SEVERE 09/13/2009   Qualifier: Diagnosis of  By: Debby BudNorins MD, Rosalyn GessMichael E   . History of alcohol abuse   . HTN (hypertension) 10/09/2011  . MIGRAINE HEADACHE 04/10/2007   Qualifier: Diagnosis of  By: Genelle GatherShoffner CMA, [place]      Family History  Problem Relation Age of Onset  . Hypertension  Mother   . Hypertension Father   . Alcohol abuse Father   . Alcohol abuse Paternal Aunt   . Alcohol abuse Paternal Uncle   . Alcohol abuse Paternal Grandfather   . Schizophrenia Paternal Grandfather   . Alcohol abuse Paternal Grandmother     Past Surgical History:  Procedure Laterality Date  . finger surgury    . ORIF ANKLE FRACTURE Right   . 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 Smoking status: Former Games developermoker  . Smokeless tobacco: Current User    Types: Chew  . Alcohol use No     Comment: recovering alcoholic -- 42  months sober.   . Drug use: No     Comment: previous benzo addiction  . Sexual activity: Yes    Partners: Female

## 2016-03-07 ENCOUNTER — Encounter (INDEPENDENT_AMBULATORY_CARE_PROVIDER_SITE_OTHER): Payer: Self-pay | Admitting: Radiology

## 2016-03-07 ENCOUNTER — Telehealth (INDEPENDENT_AMBULATORY_CARE_PROVIDER_SITE_OTHER): Payer: Self-pay | Admitting: *Deleted

## 2016-03-07 NOTE — Telephone Encounter (Signed)
Pt called stating he has a return to work note for Monday 12/18. Pt is in a boot and cannot work in a boot because of his job. He needs to talk to someone about this.

## 2016-03-07 NOTE — Telephone Encounter (Signed)
I called spoke with patient he is cannot return to work with fracture boot due to safety issues, he feels he cannot wear ankle brace either. Requesting extension for additional week out of work. Ok per MD, if note is prolonged further would need return office visit. Emailed to patient.

## 2016-03-10 ENCOUNTER — Encounter: Payer: Self-pay | Admitting: Physician Assistant

## 2016-03-10 ENCOUNTER — Ambulatory Visit (INDEPENDENT_AMBULATORY_CARE_PROVIDER_SITE_OTHER): Payer: Managed Care, Other (non HMO) | Admitting: Physician Assistant

## 2016-03-10 VITALS — BP 120/90 | HR 63 | Temp 97.6°F | Resp 16 | Ht 70.0 in | Wt 157.0 lb

## 2016-03-10 DIAGNOSIS — J0110 Acute frontal sinusitis, unspecified: Secondary | ICD-10-CM

## 2016-03-10 MED ORDER — AMOXICILLIN-POT CLAVULANATE 875-125 MG PO TABS
1.0000 | ORAL_TABLET | Freq: Two times a day (BID) | ORAL | 0 refills | Status: DC
Start: 2016-03-10 — End: 2016-03-14

## 2016-03-10 NOTE — Progress Notes (Signed)
Patient presents to clinic today c/o nasal congestion, head congestion with sinus pressure /pain, mainly frontal sinuses x 5 days. Endorses symptoms acutely worsening over the past 2 days, now with a low-grade fever (Tmax 100.5). Endorses bilateral ear pain and some facial pain. Endorses mild dry cough and increased indigestion from PND. Denies recent travel.   Past Medical History:  Diagnosis Date  . DEPRESSION, MAJOR, SEVERE 09/13/2009   Qualifier: Diagnosis of  By: Debby BudNorins MD, Rosalyn GessMichael E   . History of alcohol abuse   . HTN (hypertension) 10/09/2011  . MIGRAINE HEADACHE 04/10/2007   Qualifier: Diagnosis of  By: Genelle GatherShoffner CMA, [place]      No current outpatient prescriptions on file prior to visit.   No current facility-administered medications on file prior to visit.     Allergies  Allergen Reactions  . Codeine Nausea And Vomiting    Family History  Problem Relation Age of Onset  . Hypertension Mother   . Hypertension Father   . Alcohol abuse Father   . Alcohol abuse Paternal Aunt   . Alcohol abuse Paternal Uncle   . Alcohol abuse Paternal Grandfather   . Schizophrenia Paternal Grandfather   . Alcohol abuse Paternal Grandmother     Social History   Social History  . Marital status: Married    Spouse name: N/A  . Number of children: 1  . 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 Smoking status: Former Games developermoker  . Smokeless tobacco: Current User    Types: Chew  . 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   . Drug use: No     Comment: previous benzo addiction  . Sexual activity: Yes    Partners: Female   Other Topics Concern  . 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 Married -'11(?). 1 dtr - '12. Work - Chartered certified accountantmachinist.      09/03/2012 AHW Casimiro NeedleMichael was born and grew [place] He has a younger sister. His parents are still together and  healthy. He graduated from high school, and has all but a degree at [employer] in Musicianmachining. 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 He has been married for 5 years, and has an 8766-month-old daughter. He currently lives with his wife and daughter. 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 His social support system consists of his sponsor, wife, mother, father, sister, brother-in-law, and friends. 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 He denies any legal problems. He enjoys golf and firearms. 09/03/2012 AHW   Review of Systems - See HPI.  All other ROS are negative.  BP 120/90   Pulse 63   Temp 97.6 F (36.4 C) (Oral)   Resp 16   Ht 5\' 10"  (1.778 m)   Wt 157 lb (71.2 kg)   SpO2 98%   BMI 22.53 kg/m   Physical Exam  Constitutional: He is oriented to person, place, and time and well-developed, well-nourished, and in no distress.  HENT:  Head: Normocephalic and atraumatic.  Right Ear: Tympanic membrane normal.  Left Ear: Tympanic membrane normal.  Nose: Mucosal edema and rhinorrhea present. Right sinus exhibits frontal sinus tenderness. Left sinus exhibits frontal sinus tenderness.  Mouth/Throat: Uvula is midline, oropharynx is clear and moist and mucous membranes are normal.  Eyes: Conjunctivae are normal.  Neck: Neck supple.  Cardiovascular: Normal rate, regular rhythm, normal heart sounds and intact distal pulses.   Pulmonary/Chest: Effort normal and breath sounds normal. No respiratory distress. He has no wheezes. He has no rales. He exhibits no tenderness.  Lymphadenopathy:    He has no cervical adenopathy.  Neurological: He is alert and oriented to person, place, and time.  Skin: Skin is warm and dry. No rash noted.  Psychiatric: Affect normal.  Vitals reviewed.  Assessment/Plan: 1. Acute frontal sinusitis, recurrence not specified Rx Augmentin.  Increase fluids.  Rest.  Saline nasal spray.  Probiotic.  Mucinex as  directed.  Humidifier in bedroom.  Call or return to clinic if symptoms are not improving.  - amoxicillin-clavulanate (AUGMENTIN) 875-125 MG tablet; Take 1 tablet by mouth 2 (two) times daily.  Dispense: 14 tablet; Refill: 0   Piedad ClimesMartin, Amaria Mundorf Cody, [place] JerseyPA-C

## 2016-03-10 NOTE — Patient Instructions (Signed)
Please take antibiotic as directed.  Increase fluid intake.  Use Saline nasal spray.  Take a daily multivitamin. Mucinex-D for congestion.  Place a humidifier in the bedroom.  Please call or return clinic if symptoms are not improving.  Sinusitis Sinusitis is redness, soreness, and swelling (inflammation) of the paranasal sinuses. Paranasal sinuses are air pockets within the bones of your face (beneath the eyes, the middle of the forehead, or above the eyes). In healthy paranasal sinuses, mucus is able to drain out, and air is able to circulate through them by way of your nose. However, when your paranasal sinuses are inflamed, mucus and air can become trapped. This can allow bacteria and other germs to grow and cause infection. Sinusitis can develop quickly and last only a short time (acute) or continue over a long period (chronic). Sinusitis that lasts for more than 12 weeks is considered chronic.  CAUSES  Causes of sinusitis include:  Allergies.  Structural abnormalities, such as displacement of the cartilage that separates your nostrils (deviated septum), which can decrease the air flow through your nose and sinuses and affect sinus drainage.  Functional abnormalities, such as when the small hairs (cilia) that line your sinuses and help remove mucus do not work properly or are not present. SYMPTOMS  Symptoms of acute and chronic sinusitis are the same. The primary symptoms are pain and pressure around the affected sinuses. Other symptoms include:  Upper toothache.  Earache.  Headache.  Bad breath.  Decreased sense of smell and taste.  A cough, which worsens when you are lying flat.  Fatigue.  Fever.  Thick drainage from your nose, which often is green and may contain pus (purulent).  Swelling and warmth over the affected sinuses. DIAGNOSIS  Your caregiver will perform a physical exam. During the exam, your caregiver may:  Look in your nose for signs of abnormal growths in  your nostrils (nasal polyps).  Tap over the affected sinus to check for signs of infection.  View the inside of your sinuses (endoscopy) with a special imaging device with a light attached (endoscope), which is inserted into your sinuses. If your caregiver suspects that you have chronic sinusitis, one or more of the following tests may be recommended:  Allergy tests.  Nasal culture A sample of mucus is taken from your nose and sent to a lab and screened for bacteria.  Nasal cytology A sample of mucus is taken from your nose and examined by your caregiver to determine if your sinusitis is related to an allergy. TREATMENT  Most cases of acute sinusitis are related to a viral infection and will resolve on their own within 10 days. Sometimes medicines are prescribed to help relieve symptoms (pain medicine, decongestants, nasal steroid sprays, or saline sprays).  However, for sinusitis related to a bacterial infection, your caregiver will prescribe antibiotic medicines. These are medicines that will help kill the bacteria causing the infection.  Rarely, sinusitis is caused by a fungal infection. In theses cases, your caregiver will prescribe antifungal medicine. For some cases of chronic sinusitis, surgery is needed. Generally, these are cases in which sinusitis recurs more than 3 times per year, despite other treatments. HOME CARE INSTRUCTIONS   Drink plenty of water. Water helps thin the mucus so your sinuses can drain more easily.  Use a humidifier.  Inhale steam 3 to 4 times a day (for example, sit in the bathroom with the shower running).  Apply a warm, moist washcloth to your face 3 to 4  times a day, or as directed by your caregiver.  Use saline nasal sprays to help moisten and clean your sinuses.  Take over-the-counter or prescription medicines for pain, discomfort, or fever only as directed by your caregiver. SEEK IMMEDIATE MEDICAL CARE IF:  You have increasing pain or severe  headaches.  You have nausea, vomiting, or drowsiness.  You have swelling around your face.  You have vision problems.  You have a stiff neck.  You have difficulty breathing. MAKE SURE YOU:   Understand these instructions.  Will watch your condition.  Will get help right away if you are not doing well or get worse. Document Released: 03/10/2005 Document Revised: 06/02/2011 Document Reviewed: 03/25/2011 ExitCare Patient Information 2014 Exit[employer].   

## 2016-03-10 NOTE — Progress Notes (Signed)
Pre visit review using our clinic review tool, if applicable. No additional management support is needed unless otherwise documented below in the visit note. 

## 2016-03-14 ENCOUNTER — Ambulatory Visit (INDEPENDENT_AMBULATORY_CARE_PROVIDER_SITE_OTHER): Payer: Managed Care, Other (non HMO) | Admitting: Physician Assistant

## 2016-03-14 ENCOUNTER — Encounter: Payer: Self-pay | Admitting: Physician Assistant

## 2016-03-14 VITALS — BP 130/86 | HR 75 | Temp 98.4°F | Resp 16 | Ht 70.0 in | Wt 159.0 lb

## 2016-03-14 DIAGNOSIS — J0110 Acute frontal sinusitis, unspecified: Secondary | ICD-10-CM

## 2016-03-14 MED ORDER — DOXYCYCLINE HYCLATE 100 MG PO CAPS: 100.0000 mg | ORAL_CAPSULE | Freq: Two times a day (BID) | ORAL | 0 refills | Status: DC

## 2016-03-14 MED ORDER — METHYLPREDNISOLONE ACETATE 80 MG/ML IJ SUSP
80.0000 mg | Freq: Once | INTRAMUSCULAR | Status: AC
  Administered 2016-03-14: 80 mg via INTRAMUSCULAR

## 2016-03-14 NOTE — Progress Notes (Signed)
Patient presents to clinic today c/o worsening sinus symptoms despite treatment with Augmentin. Endorses worsening R sinus pressure/pain with fatigue. Continue cough and ear pressure/pain. Denies fever, chest pain or SOB. Is following instructions given at last visit.   Past Medical History:  Diagnosis Date  . DEPRESSION, MAJOR, SEVERE 09/13/2009   Qualifier: Diagnosis of  By: Debby BudNorins MD, Rosalyn GessMichael E   . History of alcohol abuse   . HTN (hypertension) 10/09/2011  . MIGRAINE HEADACHE 04/10/2007   Qualifier: Diagnosis of  By: Genelle GatherShoffner CMA, [place]      Current Outpatient Prescriptions on File Prior to Visit  Medication Sig Dispense Refill  . amoxicillin-clavulanate (AUGMENTIN) 875-125 MG tablet Take 1 tablet by mouth 2 (two) times daily. 14 tablet 0   No current facility-administered medications on file prior to visit.     Allergies  Allergen Reactions  . Codeine Nausea And Vomiting    Family History  Problem Relation Age of Onset  . Hypertension Mother   . Hypertension Father   . Alcohol abuse Father   . Alcohol abuse Paternal Aunt   . Alcohol abuse Paternal Uncle   . Alcohol abuse Paternal Grandfather   . Schizophrenia Paternal Grandfather   . Alcohol abuse Paternal Grandmother     Social History   Social History  . Marital status: Married    Spouse name: N/A  . Number of children: 1  . 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 Smoking status: Former Games developermoker  . Smokeless tobacco: Current User    Types: Chew  . 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   . Drug use: No     Comment: previous benzo addiction  . Sexual activity: Yes    Partners: Female   Other Topics Concern  . 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 Married -'11(?). 1 dtr - '12. Work - Chartered certified accountantmachinist.      09/03/2012 [name] was born and grew [place] in [place]. He has a younger  sister. His parents are still together and healthy. He graduated from high school, and has all but a degree at [employer] in Musicianmachining. 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 He has been married for 5 years, and has an 6578-month-old daughter. He currently lives with his wife and daughter. 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 His social support system consists of his sponsor, wife, mother, father, sister, brother-in-law, and friends. 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 He denies any legal problems. He enjoys golf and firearms. 09/03/2012 AHW   Review of Systems - See HPI.  All other ROS are negative.  BP 130/86   Pulse 75   Temp 98.4 F (36.9 C) (Oral)   Resp 16   Ht 5\' 10"  (1.778 m)   Wt 159 lb (72.1 kg)   SpO2 98%   BMI 22.81 kg/m   Physical Exam  Constitutional: He is oriented to person, place, and time and well-developed, well-nourished, and in no distress.  HENT:  Head: Normocephalic and atraumatic.  Right Ear: Tympanic membrane normal.  Left Ear: Tympanic membrane normal.  Nose: Mucosal edema and rhinorrhea present. Right sinus exhibits maxillary sinus tenderness and frontal sinus tenderness.  Mouth/Throat: Uvula is midline, oropharynx is clear and moist and mucous membranes are normal.  Eyes: Conjunctivae  are normal.  Neck: Neck supple.  Cardiovascular: Normal rate, regular rhythm, normal heart sounds and intact distal pulses.   Pulmonary/Chest: Effort normal and breath sounds normal. No respiratory distress. He has no wheezes. He has no rales. He exhibits no tenderness.  Neurological: He is alert and oriented to person, place, and time.  Skin: Skin is warm and dry. No rash noted.  Psychiatric: Affect normal.  Vitals reviewed.  Assessment/Plan: 1. Acute frontal sinusitis, recurrence not specified IM depomedrol given. Stop Augmentin. Start Doxycycline. Continue supportive measures. ENT if symptoms are not  resolving.   - methylPREDNISolone acetate (DEPO-MEDROL) injection 80 mg; Inject 1 mL (80 mg total) into the muscle once.   Piedad ClimesMartin, Caedon Bond Cody, PA-C

## 2016-03-14 NOTE — Patient Instructions (Signed)
Stop Augmentin and start the Doxycycline. The steroid shot given today should help reduce sinus inflammation. Continue care and supportive measures discussed at previous visit.  If not resolving, I would want to set you up with an ENT specialist.

## 2016-03-14 NOTE — Progress Notes (Signed)
Pre visit review using our clinic review tool, if applicable. No additional management support is needed unless otherwise documented below in the visit note. 

## 2016-06-09 ENCOUNTER — Ambulatory Visit (INDEPENDENT_AMBULATORY_CARE_PROVIDER_SITE_OTHER): Payer: Managed Care, Other (non HMO) | Admitting: Physician Assistant

## 2016-06-09 ENCOUNTER — Encounter: Payer: Self-pay | Admitting: Physician Assistant

## 2016-06-09 VITALS — BP 118/84 | HR 60 | Temp 98.1°F | Resp 14 | Ht 70.0 in | Wt 153.0 lb

## 2016-06-09 DIAGNOSIS — J208 Acute bronchitis due to other specified organisms: Secondary | ICD-10-CM

## 2016-06-09 DIAGNOSIS — B9689 Other specified bacterial agents as the cause of diseases classified elsewhere: Secondary | ICD-10-CM | POA: Diagnosis not present

## 2016-06-09 MED ORDER — BENZONATATE 100 MG PO CAPS: 100.0000 mg | ORAL_CAPSULE | Freq: Two times a day (BID) | ORAL | 0 refills | Status: DC | PRN

## 2016-06-09 MED ORDER — ALBUTEROL SULFATE HFA 108 (90 BASE) MCG/ACT IN AERS: 2.0000 | INHALATION_SPRAY | Freq: Four times a day (QID) | RESPIRATORY_TRACT | 0 refills | Status: DC | PRN

## 2016-06-09 MED ORDER — AZITHROMYCIN 250 MG PO TABS: ORAL_TABLET | ORAL | 0 refills | Status: DC

## 2016-06-09 NOTE — Patient Instructions (Signed)
Take antibiotic (Azithromycin) as directed.  Increase fluids.  Get plenty of rest. Use Mucinex for congestion. Tessalon as directed for cough. Take a daily probiotic (I recommend Align or Culturelle, but even Activia Yogurt may be beneficial).  A humidifier placed in the bedroom may offer some relief for a dry, scratchy throat of nasal irritation.  Read information below on acute bronchitis. Please call or return to clinic if symptoms are not improving.  Acute Bronchitis Bronchitis is when the airways that extend from the windpipe into the lungs get red, puffy, and painful (inflamed). Bronchitis often causes thick spit (mucus) to develop. This leads to a cough. A cough is the most common symptom of bronchitis. In acute bronchitis, the condition usually begins suddenly and goes away over time (usually in 2 weeks). Smoking, allergies, and asthma can make bronchitis worse. Repeated episodes of bronchitis may cause more lung problems.  HOME CARE  Rest.  Drink enough fluids to keep your pee (urine) clear or pale yellow (unless you need to limit fluids as told by your doctor).  Only take over-the-counter or prescription medicines as told by your doctor.  Avoid smoking and secondhand smoke. These can make bronchitis worse. If you are a smoker, think about using nicotine gum or skin patches. Quitting smoking will help your lungs heal faster.  Reduce the chance of getting bronchitis again by:  Washing your hands often.  Avoiding people with cold symptoms.  Trying not to touch your hands to your mouth, nose, or eyes.  Follow up with your doctor as told.  GET HELP IF: Your symptoms do not improve after 1 week of treatment. Symptoms include:  Cough.  Fever.  Coughing up thick spit.  Body aches.  Chest congestion.  Chills.  Shortness of breath.  Sore throat.  GET HELP RIGHT AWAY IF:   You have an increased fever.  You have chills.  You have severe shortness of breath.  You  have bloody thick spit (sputum).  You throw up (vomit) often.  You lose too much body fluid (dehydration).  You have a severe headache.  You faint.  MAKE SURE YOU:   Understand these instructions.  Will watch your condition.  Will get help right away if you are not doing well or get worse. Document Released: 08/27/2007 Document Revised: 11/10/2012 Document Reviewed: 08/31/2012 ExitCare Patient Information 2015 [employer]. This information is not intended to replace advice given to you by your health care provider. Make sure you discuss any questions you have with your health care provider.   

## 2016-06-09 NOTE — Progress Notes (Signed)
Patient presents to clinic today c/o 1 week of URI symptoms. Started out as mild sinus pressure and sinus headache that was gradually worsening until Saturday when things acutely worsened. Endorses chest congestion with dry cough. Endorses some chest tenderness with coughing. Denies chest pain, SOB or wheeze. Endorses a deep breath incites a coughing spell. Denies recent travel or sick contact. Has taken Zicam and cold-eeze without any major improvement in symptoms.   Past Medical History:  Diagnosis Date  . DEPRESSION, MAJOR, SEVERE 09/13/2009   Qualifier: Diagnosis of  By: Debby BudNorins MD, Rosalyn GessMichael E   . History of alcohol abuse   . HTN (hypertension) 10/09/2011  . MIGRAINE HEADACHE 04/10/2007   Qualifier: Diagnosis of  By: Genelle GatherShoffner CMA, [place]      No current outpatient prescriptions on file prior to visit.   No current facility-administered medications on file prior to visit.     Allergies  Allergen Reactions  . Codeine Nausea And Vomiting    Family History  Problem Relation Age of Onset  . Hypertension Mother   . Hypertension Father   . Alcohol abuse Father   . Alcohol abuse Paternal Aunt   . Alcohol abuse Paternal Uncle   . Alcohol abuse Paternal Grandfather   . Schizophrenia Paternal Grandfather   . Alcohol abuse Paternal Grandmother     Social History   Social History  . Marital status: Married    Spouse name: N/A  . Number of children: 1  . Years of education: 414   Occupational History  . machinist [employer]   Social History Main Topics  . Smoking status: Former Games developermoker  . Smokeless tobacco: Current User    Types: Chew  . 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   . Drug use: No     Comment: previous benzo addiction  . Sexual activity: Yes    Partners: Female   Other Topics Concern  . 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 Married -'11(?). 1 dtr - '12. Work - Chartered certified accountantmachinist.      09/03/2012 [name] was  [place] He has a younger sister. His parents are still together and healthy. He graduated from high school, and has all but a degree at [employer] in Musicianmachining. 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 He has been married for 5 years, and has an 2523-month-old daughter. He currently lives with his wife and daughter. 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 His social support system consists of his sponsor, wife, mother, father, sister, brother-in-law, and friends. 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 He denies any legal problems. He enjoys golf and firearms. 09/03/2012 AHW   Review of Systems - See HPI.  All other ROS are negative.  BP 118/84   Pulse 60   Temp 98.1 F (36.7 C) (Oral)   Resp 14   Ht 5\' 10"  (1.778 m)   Wt 153 lb (69.4 kg)   SpO2 97%   BMI 21.95 kg/m   Physical Exam  Constitutional: He is oriented to person, place, and time and well-developed, well-nourished, and in no distress.  HENT:  Head: Normocephalic and atraumatic.  Right Ear: External ear normal.  Left Ear: External ear normal.  Nose: Nose normal.  Mouth/Throat: Oropharynx is clear and moist. No oropharyngeal exudate.  TM within normal limits bilaterally.  Eyes: Conjunctivae  are normal.  Neck: Neck supple.  Cardiovascular: Normal rate, regular rhythm, normal heart sounds and intact distal pulses.   Pulmonary/Chest: Effort normal and breath sounds normal. No respiratory distress. He has no wheezes. He has no rales. He exhibits no tenderness.  Neurological: He is alert and oriented to person, place, and time.  Skin: Skin is warm and dry. No rash noted.  Psychiatric: Affect normal.  Vitals reviewed.  Assessment/Plan: 1. Acute bacterial bronchitis Rx Azithromycin.  Increase fluids.  Rest.  Saline nasal spray.  Probiotic.  Mucinex as directed.  Humidifier in bedroom. Tessalon as directed.  Call or return to clinic if  symptoms are not improving.  - azithromycin (ZITHROMAX) 250 MG tablet; Take 2 tablets on Day 1. Then take 1 tablet daily.  Dispense: 6 tablet; Refill: 0 - benzonatate (TESSALON) 100 MG capsule; Take 1 capsule (100 mg total) by mouth 2 (two) times daily as needed for cough.  Dispense: 20 capsule; Refill: 0   Piedad Climes, [place]

## 2016-06-09 NOTE — Progress Notes (Signed)
Pre visit review using our clinic review tool, if applicable. No additional management support is needed unless otherwise documented below in the visit note. 

## 2016-10-24 ENCOUNTER — Encounter: Payer: Self-pay | Admitting: Physician Assistant

## 2016-10-24 ENCOUNTER — Ambulatory Visit (INDEPENDENT_AMBULATORY_CARE_PROVIDER_SITE_OTHER): Payer: Managed Care, Other (non HMO) | Admitting: Physician Assistant

## 2016-10-24 VITALS — BP 121/83 | HR 78 | Temp 98.2°F | Resp 17 | Ht 70.0 in | Wt 146.2 lb

## 2016-10-24 DIAGNOSIS — S29011A Strain of muscle and tendon of front wall of thorax, initial encounter: Secondary | ICD-10-CM

## 2016-10-24 DIAGNOSIS — B880 Other acariasis: Secondary | ICD-10-CM | POA: Diagnosis not present

## 2016-10-24 MED ORDER — MELOXICAM 15 MG PO TABS: 15.0000 mg | ORAL_TABLET | Freq: Every day | ORAL | 0 refills | Status: DC

## 2016-10-24 MED ORDER — TRIAMCINOLONE ACETONIDE 0.5 % EX CREA: 1.0000 "application " | TOPICAL_CREAM | Freq: Two times a day (BID) | CUTANEOUS | 0 refills | Status: DC

## 2016-10-24 MED ORDER — PERMETHRIN 5 % EX CREA: TOPICAL_CREAM | CUTANEOUS | 0 refills | Status: DC

## 2016-10-24 MED ORDER — HYDROXYZINE HCL 10 MG PO TABS: 10.0000 mg | ORAL_TABLET | Freq: Three times a day (TID) | ORAL | 0 refills | Status: DC | PRN

## 2016-10-24 NOTE — Progress Notes (Signed)
Patient presents to clinic today c/o bug bites of ankles and lower legs bilaterally, extending to inguinal region that has been present for 2 weeks he spent an extended working in the yard. Denies any note of insect on the skin. Areas are intensely pruritic but nonpainful. Patient was assessed in [facility] last week. Was started on a 5 day steroid taper. Patient noted mild decrease in itching with steroid, without improvement in rash. Denies new lesions. Patient denies fever chills malaise or fatigue. Denies recent travel or contact with similar symptoms.  Patient also endorses tenderness in the left pectoral region after an episode of heavy lifting earlier th States he notices pain with palpation on the area. Denies bruising. Denies shoulder pains, tingling, weakness or decreased range of motion.  Past Medical History:  Diagnosis Date  . DEPRESSION, MAJOR, SEVERE 09/13/2009   Qualifier: Diagnosis of  By: Debby BudNorins MD, Rosalyn GessMichael E   . History of alcohol abuse   . HTN (hypertension) 10/09/2011  . MIGRAINE HEADACHE 04/10/2007   Qualifier: Diagnosis of  By: Genelle GatherShoffner CMA, [place]      No current outpatient prescriptions on file prior to visit.   No current facility-administered medications on file prior to visit.     Allergies  Allergen Reactions  . Codeine Nausea And Vomiting    Family History  Problem Relation Age of Onset  . Hypertension Mother   . Hypertension Father   . Alcohol abuse Father   . Alcohol abuse Paternal Aunt   . Alcohol abuse Paternal Uncle   . Alcohol abuse Paternal Grandfather   . Schizophrenia Paternal Grandfather   . Alcohol abuse Paternal Grandmother     Social History   Social History  . Marital status: Married    Spouse name: N/A  . Number of children: 1  . 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 Smoking status: Former Games developermoker  . Smokeless tobacco: Current User    Types: Chew  .  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   . Drug use: No     Comment: previous benzo addiction  . Sexual activity: Yes    Partners: Female   Other Topics Concern  . 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 Married -'11(?). 1 dtr - '12. Work - Chartered certified accountantmachinist.      09/03/2012 AHW Casimiro NeedleMichael was born and [place]. He has a younger sister. His parents are still together and healthy. He graduated from high school, and has all but a degree at [employer] in Musicianmachining. 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 He has been married for 5 years, and has an 2932-month-old daughter. He currently lives with his wife and daughter. 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 His social support system consists of his sponsor, wife, mother, father, sister, brother-in-law, and friends. 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 He denies any legal problems. He enjoys golf and firearms. 09/03/2012 AHW   Review of Systems - See HPI.  All other ROS are negative.  BP 121/83   Pulse 78   Temp 98.2 F (36.8 C) (Oral)   Resp 17   Ht 5\' 10"  (1.778 m)   Wt 146 lb 4 oz (66.3 kg)   SpO2 98%   BMI 20.98 kg/m  Physical Exam  Constitutional: He is oriented to person, place, and time and well-developed, well-nourished, and in no distress.  HENT:  Head: Normocephalic and atraumatic.  Eyes: Conjunctivae are normal.  Cardiovascular: Normal rate, regular rhythm, normal heart sounds and intact distal pulses.   Neurological: He is alert and oriented to person, place, and time. No cranial nerve deficit.  Skin: Skin is warm and dry.  Scattered bites of lower extremities bilaterally with intense red centers.   Psychiatric: Affect normal.  Vitals reviewed.  Assessment/Plan: 1. Strain of left pectoralis muscle, initial encounter Rx Mobic.Supportive  Over-the-counter medications reviewed. Follow-up if not  resolving. - meloxicam (MOBIC) 15 MG tablet; Take 1 tablet (15 mg total) by mouth daily.  Dispense: 15 tablet; Refill: 0  2. Chigger bites Multiple. We will give prescription for Elimite cream. Start hydroxyzine TID when necessary for severe itch. Start Kenalog topically. Supportive measures were reviewed. Discussed measures to take at home. Follow up if not resolving. - permethrin (ELIMITE) 5 % cream; Apply 1 application, waist down in the evening. Wash off 7-8 hours later  Dispense: 60 g; Refill: 0 - hydrOXYzine (ATARAX/VISTARIL) 10 MG tablet; Take 1 tablet (10 mg total) by mouth 3 (three) times daily as needed.  Dispense: 30 tablet; Refill: 0 - triamcinolone cream (KENALOG) 0.5 %; Apply 1 application topically 2 (two) times daily.  Dispense: 30 g; Refill: 0   Piedad ClimesMartin, Rogene Meth Cody, [place]

## 2016-10-24 NOTE — Patient Instructions (Signed)
For the Pectoral Strain:  - Avoid heavy lifting and overexertion.  - Dampen the area before applying your topical creams  - Take Mobic once daily with food.   - Use Tylenol if needed for breakthrough pain.   - If not improving we will need a Sports Medicine assessment or physical therapy evaluation.   For the Bites:  - These seem most consistent with a reaction to chigger bites.  - Use the premethrin cream as directed. Wash off as directed.   - The Hydroxyzine will help with itch. You can also apply a topical Witch Hazel to the areas.  - Use the Kenalog cream to the areas twice daily -- avoiding use in the private areas.  - Wash all bed linens.  - Watch out for tall grass and other areas of potential reexposure.

## 2016-10-24 NOTE — Progress Notes (Signed)
Pre visit review using our clinic review tool, if applicable. No additional management support is needed unless otherwise documented below in the visit note. 

## 2017-08-19 ENCOUNTER — Other Ambulatory Visit: Payer: Self-pay

## 2017-08-19 ENCOUNTER — Encounter: Payer: Self-pay | Admitting: Physician Assistant

## 2017-08-19 ENCOUNTER — Ambulatory Visit: Payer: Managed Care, Other (non HMO) | Admitting: Physician Assistant

## 2017-08-19 VITALS — BP 114/74 | HR 54 | Temp 97.9°F | Resp 15 | Ht 69.0 in | Wt 146.4 lb

## 2017-08-19 DIAGNOSIS — W57XXXD Bitten or stung by nonvenomous insect and other nonvenomous arthropods, subsequent encounter: Secondary | ICD-10-CM | POA: Diagnosis not present

## 2017-08-19 DIAGNOSIS — A084 Viral intestinal infection, unspecified: Secondary | ICD-10-CM | POA: Diagnosis not present

## 2017-08-19 DIAGNOSIS — S30860A Insect bite (nonvenomous) of lower back and pelvis, initial encounter: Secondary | ICD-10-CM

## 2017-08-19 DIAGNOSIS — K644 Residual hemorrhoidal skin tags: Secondary | ICD-10-CM

## 2017-08-19 MED ORDER — HYDROCORTISONE 2.5 % RE CREA: 1.0000 "application " | TOPICAL_CREAM | Freq: Two times a day (BID) | RECTAL | 0 refills | Status: DC

## 2017-08-19 NOTE — Progress Notes (Signed)
Patient presents to clinic today with multiple concerns.  Patient notes finding a tick on his back a week ago. States he removed it successfully and that the tick could have only been present for a few hours. States he noted mild redness at the site of bite. Was evaluated at an urgent care and was started on Doxycycline. Denies any fever, chills, rash or aches at that time. Has been taking as directed. Since then has noted fatigue, stomach upset and diarrhea. Denies melena or hematochezia. Does note now having some mild tenesmus and can feel a lump at his anus. Denies any personal history of hemorrhoid or fissure. Denies fever, chills. Diarrhea has calmed down over this past weekend but he is still having some grumbling and cramping of the abdomen. Notes he is hydrating well.  Past Medical History:  Diagnosis Date  . DEPRESSION, MAJOR, SEVERE 09/13/2009   Qualifier: Diagnosis of  By: Debby Bud MD, Rosalyn Gess   . History of alcohol abuse   . HTN (hypertension) 10/09/2011  . MIGRAINE HEADACHE 04/10/2007   Qualifier: Diagnosis of  By: Genelle Gather CMA, [place]      Current Outpatient Medications on File Prior to Visit  Medication Sig Dispense Refill  . doxycycline (VIBRA-TABS) 100 MG tablet PLEASE SEE ATTACHED FOR DETAILED DIRECTIONS  0   No current facility-administered medications on file prior to visit.     Allergies  Allergen Reactions  . Codeine Nausea And Vomiting    Family History  Problem Relation Age of Onset  . Hypertension Mother   . Hypertension Father   . Alcohol abuse Father   . Alcohol abuse Paternal Aunt   . Alcohol abuse Paternal Uncle   . Alcohol abuse Paternal Grandfather   . Schizophrenia Paternal Grandfather   . Alcohol abuse Paternal Grandmother     Social History   Socioeconomic History  . Marital status: Married    Spouse name: Not on file  . Number of children: 1  . Years of education: 41  . Highest education level: Not on file  Occupational History  .  Occupation: Chiropodist: [employer]  Social Needs  . Financial resource strain: Not on file  . Food insecurity:    Worry: Not on file    Inability: Not on file  . Transportation needs:    Medical: Not on file    Non-medical: Not on file  Tobacco Use  . Smoking status: Former Games developer  . Smokeless tobacco: Current User    Types: Chew  Substance and Sexual Activity  . 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   . Drug use: No    Frequency: 30.0 times per week    Types: Benzodiazepines    Comment: previous benzo addiction  . Sexual activity: Yes    Partners: Female  Lifestyle  . Physical activity:    Days per week: Not on file    Minutes per session: Not on file  . Stress: Not on file  Relationships  . Social connections:    Talks on phone: Not on file    Gets together: Not on file    Attends religious service: Not on file    Active member of club or organization: Not on file    Attends meetings of clubs or organizations: Not on file    Relationship status: Not on file  Other Topics Concern  . Not on file  Social History Narrative   HSG, Scientist, product/process development  college. Married -'11(?). 1 dtr - '12. Work - Chartered certified accountant.      09/03/2012 [name] was born and grew up in [place], [place]. He has a younger sister. His parents are still together and healthy. He graduated from high school, and has all but a degree at [employer] in Musician. 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 He has been married for 5 years, and has an 20-month-old daughter. He currently lives with his wife and daughter. 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 His social support system consists of his sponsor, wife, mother, father, sister, brother-in-law, and friends. He affiliates as a International aid/development worker. He denies any legal problems. He enjoys golf and firearms. 09/03/2012 AHW   Review of Systems - See HPI.  All other  ROS are negative.  BP 114/74   Pulse (!) 54   Temp 97.9 F (36.6 C) (Oral)   Resp 15   Ht  (1.753 m)   Wt 146 lb 6.4 oz (66.4 kg)   SpO2 98%   BMI 21.62 kg/m   Physical Exam  Constitutional: He appears well-developed and well-nourished.  HENT:  Head: Normocephalic and atraumatic.  Mouth/Throat: Oropharynx is clear and moist.  Eyes: Pupils are equal, round, and reactive to light. Conjunctivae and EOM are normal.  Neck: Neck supple. No thyromegaly present.  Cardiovascular: Normal rate, regular rhythm and normal heart sounds.  Pulmonary/Chest: Effort normal and breath sounds normal. No stridor. No respiratory distress. He has no wheezes. He has no rales.  Abdominal: Soft. He exhibits no distension and no mass. Bowel sounds are increased. There is generalized tenderness. There is no rebound and no guarding. No hernia.  Genitourinary: Rectal exam shows external hemorrhoid. Rectal exam shows no internal hemorrhoid and no fissure.  Genitourinary Comments: Chaperone present for examination.  Lymphadenopathy:    He has no cervical adenopathy.  Neurological: He is alert.   Recent Results (from the past 2160 hour(s))  B. burgdorfi antibodies     Status: None   Collection Time: 08/19/17  4:44 PM  Result Value Ref Range   B burgdorferi Ab IgG+IgM <0.90 index    Comment:                    Index                Interpretation                    -----                --------------                    < 0.90               Negative                    0.90-1.09            Equivocal                    > 1.09               Positive . As recommended by the Food and Drug Administration  (FDA), all samples with positive or equivocal  results in a Borrelia burgdorferi antibody screen will be tested using a blot method. Positive or  equivocal screening test results should not be  interpreted as  truly positive until verified as such  using a supplemental assay (e.g., B. burgdorferi  blot). . The screening test and/or blot for B. burgdorferi  antibodies may be falsely negative in early stages of Lyme disease, including the period when erythema  migrans is apparent. .   Rocky mtn spotted fvr abs pnl(IgG+IgM)     Status: None   Collection Time: 08/19/17  4:44 PM  Result Value Ref Range   RMSF IgG NOT DETECTED NOT DETECT   RMSF IgM NOT DETECTED NOT DETECT  EXTRA LAV TOP TUBE     Status: None   Collection Time: 08/19/17  4:44 PM  Result Value Ref Range   EXTRA LAVENDER-TOP TUBE      Comment: We received an extra specimen with no test requested. If any test is desired for this specimen please call client services and advise.    Assessment/Plan: 1. Tick bite, subsequent encounter Doubtful there is tick-borne illness but to make sure we are not missing anything and to verify patient can stop Doxycycline, will check panels today as Ab should be positive at this point.  - CBC with Differential/Platelet - Comprehensive metabolic panel - B. burgdorfi antibodies - Rocky mtn spotted fvr abs pnl(IgG+IgM)  2. Viral gastroenteritis Will check labs today. Diarrhea has resolved but abdominal cramping is still present. Supportive measures and OTC medications reviewed. Suspect symptoms are exacerbated by ABX. Will hold ABX until results are in and make further decisions from there.  - CBC with Differential/Platelet - Comprehensive metabolic panel  3. External hemorrhoid Noted on exam. Secondary to constant bowel movements. Discussed proper diet. Rx Hydrocortisone rectal ointment to use as directed. If not improving, will need GI or CRS input.  - hydrocortisone (ANUSOL-HC) 2.5 % rectal cream; Place 1 application rectally 2 (two) times daily.  Dispense: 30 g; Refill: 0   Piedad Climes, PA-C

## 2017-08-19 NOTE — Patient Instructions (Addendum)
Please keep well-hydrated and get plenty of rest.  Start a daily probiotic -- Digestive Advantage, Culturelle  We are checking labs today to assess recent symptoms further.  To me it sounds like a viral gastroenteritis especially since symptoms did not improve with antibiotics.  The labs will tell us if we need anitbiotic therapy or not.   Hold the doxycycline tonight.   For the hemorrhoid, try to keep well-hydrated.  Use the cream given to help shrink the hemorrhoid.  Keep a well-balanced diet.  I would recommend limiting the anti-inflammatories and start a daily OTC Prilosec over the next week to 14 days.

## 2017-08-21 LAB — TEST AUTHORIZATION

## 2017-08-21 LAB — COMPLETE METABOLIC PANEL WITH GFR
AG RATIO: 1.9 (calc) (ref 1.0–2.5)
ALKALINE PHOSPHATASE (APISO): 72 U/L (ref 40–115)
ALT: 20 U/L (ref 9–46)
AST: 19 U/L (ref 10–40)
Albumin: 4.5 g/dL (ref 3.6–5.1)
BILIRUBIN TOTAL: 1.4 mg/dL — AB (ref 0.2–1.2)
BUN: 8 mg/dL (ref 7–25)
CO2: 24 mmol/L (ref 20–32)
Calcium: 9.6 mg/dL (ref 8.6–10.3)
Chloride: 105 mmol/L (ref 98–110)
Creat: 1 mg/dL (ref 0.60–1.35)
GFR, EST AFRICAN AMERICAN: 113 mL/min/{1.73_m2} (ref >=60)
GFR, Est Non African American: 97 mL/min/{1.73_m2} (ref >=60)
Globulin: 2.4 g/dL (calc) (ref 1.9–3.7)
Glucose, Bld: 80 mg/dL (ref 65–99)
POTASSIUM: 3.6 mmol/L (ref 3.5–5.3)
Sodium: 140 mmol/L (ref 135–146)
TOTAL PROTEIN: 6.9 g/dL (ref 6.1–8.1)

## 2017-08-21 LAB — CBC WITH DIFFERENTIAL/PLATELET
BASOS ABS: 82 {cells}/uL (ref 0–200)
Basophils Relative: 1 %
EOS PCT: 2.4 %
Eosinophils Absolute: 197 cells/uL (ref 15–500)
HCT: 45.6 % (ref 38.5–50.0)
HEMOGLOBIN: 15.8 g/dL (ref 13.2–17.1)
Lymphs Abs: 2132 cells/uL (ref 850–3900)
MCH: 29.9 pg (ref 27.0–33.0)
MCHC: 34.6 g/dL (ref 32.0–36.0)
MCV: 86.2 fL (ref 80.0–100.0)
MPV: 11.5 fL (ref 7.5–12.5)
Monocytes Relative: 10.6 %
NEUTROS ABS: 4920 {cells}/uL (ref 1500–7800)
Neutrophils Relative %: 60 %
Platelets: 277 10*3/uL (ref 140–400)
RBC: 5.29 10*6/uL (ref 4.20–5.80)
RDW: 12.9 % (ref 11.0–15.0)
Total Lymphocyte: 26 %
WBC mixed population: 869 cells/uL (ref 200–950)
WBC: 8.2 10*3/uL (ref 3.8–10.8)

## 2017-08-21 LAB — ROCKY MTN SPOTTED FVR ABS PNL(IGG+IGM)
RMSF IGG: NOT DETECTED
RMSF IgM: NOT DETECTED

## 2017-08-21 LAB — B. BURGDORFI ANTIBODIES: B burgdorferi Ab IgG+IgM: <0.9 index

## 2017-08-21 LAB — EXTRA LAV TOP TUBE

## 2018-05-16 ENCOUNTER — Observation Stay (HOSPITAL_COMMUNITY)
Admission: EM | Admit: 2018-05-16 | Discharge: 2018-05-17 | Disposition: A | Discharge status: Home / Self Care | Payer: 59 | Attending: Internal Medicine | Admitting: Internal Medicine

## 2018-05-16 ENCOUNTER — Other Ambulatory Visit: Payer: Self-pay

## 2018-05-16 ENCOUNTER — Encounter (HOSPITAL_COMMUNITY): Payer: Self-pay | Admitting: Emergency Medicine

## 2018-05-16 ENCOUNTER — Ambulatory Visit (HOSPITAL_COMMUNITY)
Admission: EM | Admit: 2018-05-16 | Discharge: 2018-05-16 | Disposition: A | Discharge status: Other Acute Inpatient Hospital | Payer: 59 | Source: Home / Self Care

## 2018-05-16 DIAGNOSIS — D62 Acute posthemorrhagic anemia: Secondary | ICD-10-CM | POA: Insufficient documentation

## 2018-05-16 DIAGNOSIS — K259 Gastric ulcer, unspecified as acute or chronic, without hemorrhage or perforation: Secondary | ICD-10-CM | POA: Diagnosis not present

## 2018-05-16 DIAGNOSIS — K922 Gastrointestinal hemorrhage, unspecified: Secondary | ICD-10-CM | POA: Diagnosis present

## 2018-05-16 DIAGNOSIS — R1013 Epigastric pain: Secondary | ICD-10-CM | POA: Diagnosis not present

## 2018-05-16 DIAGNOSIS — R51 Headache: Secondary | ICD-10-CM | POA: Diagnosis not present

## 2018-05-16 DIAGNOSIS — K209 Esophagitis, unspecified: Secondary | ICD-10-CM | POA: Insufficient documentation

## 2018-05-16 DIAGNOSIS — I10 Essential (primary) hypertension: Secondary | ICD-10-CM | POA: Diagnosis not present

## 2018-05-16 DIAGNOSIS — K228 Other specified diseases of esophagus: Secondary | ICD-10-CM | POA: Insufficient documentation

## 2018-05-16 DIAGNOSIS — Z87891 Personal history of nicotine dependence: Secondary | ICD-10-CM | POA: Diagnosis not present

## 2018-05-16 DIAGNOSIS — K449 Diaphragmatic hernia without obstruction or gangrene: Secondary | ICD-10-CM | POA: Diagnosis not present

## 2018-05-16 DIAGNOSIS — K269 Duodenal ulcer, unspecified as acute or chronic, without hemorrhage or perforation: Secondary | ICD-10-CM | POA: Diagnosis not present

## 2018-05-16 DIAGNOSIS — K319 Disease of stomach and duodenum, unspecified: Secondary | ICD-10-CM | POA: Diagnosis not present

## 2018-05-16 DIAGNOSIS — K920 Hematemesis: Secondary | ICD-10-CM | POA: Insufficient documentation

## 2018-05-16 DIAGNOSIS — K921 Melena: Secondary | ICD-10-CM | POA: Diagnosis not present

## 2018-05-16 HISTORY — DX: Gastric ulcer, unspecified as acute or chronic, without hemorrhage or perforation: K25.9

## 2018-05-16 HISTORY — DX: Diaphragmatic hernia without obstruction or gangrene: K44.9

## 2018-05-16 HISTORY — DX: Duodenal ulcer, unspecified as acute or chronic, without hemorrhage or perforation: K26.9

## 2018-05-16 LAB — COMPREHENSIVE METABOLIC PANEL
ALT: 24 U/L (ref 0–44)
AST: 28 U/L (ref 15–41)
Albumin: 4.3 g/dL (ref 3.5–5.0)
Alkaline Phosphatase: 61 U/L (ref 38–126)
Anion gap: 8 (ref 5–15)
BILIRUBIN TOTAL: 1.9 mg/dL — AB (ref 0.3–1.2)
BUN: 19 mg/dL (ref 6–20)
CHLORIDE: 104 mmol/L (ref 98–111)
CO2: 26 mmol/L (ref 22–32)
CREATININE: 1.01 mg/dL (ref 0.61–1.24)
Calcium: 9.3 mg/dL (ref 8.9–10.3)
Glucose, Bld: 112 mg/dL — ABNORMAL HIGH (ref 70–99)
POTASSIUM: 3.8 mmol/L (ref 3.5–5.1)
Sodium: 138 mmol/L (ref 135–145)
TOTAL PROTEIN: 7.3 g/dL (ref 6.5–8.1)

## 2018-05-16 LAB — CBC
HEMATOCRIT: 44.2 % (ref 39.0–52.0)
Hemoglobin: 15.2 g/dL (ref 13.0–17.0)
MCH: 30.2 pg (ref 26.0–34.0)
MCHC: 34.4 g/dL (ref 30.0–36.0)
MCV: 87.9 fL (ref 80.0–100.0)
PLATELETS: 286 10*3/uL (ref 150–400)
RBC: 5.03 MIL/uL (ref 4.22–5.81)
RDW: 12.3 % (ref 11.5–15.5)
WBC: 9.6 10*3/uL (ref 4.0–10.5)
nRBC: 0 % (ref 0.0–0.2)

## 2018-05-16 LAB — TYPE AND SCREEN
ABO/RH(D): B POS
ANTIBODY SCREEN: NEGATIVE

## 2018-05-16 LAB — LIPASE, BLOOD: LIPASE: 54 U/L — AB (ref 11–51)

## 2018-05-16 LAB — PROTIME-INR
INR: 1.05
PROTHROMBIN TIME: 13.6 s (ref 11.4–15.2)

## 2018-05-16 LAB — HEMOGLOBIN AND HEMATOCRIT, BLOOD
HCT: 37.6 % — ABNORMAL LOW (ref 39.0–52.0)
Hemoglobin: 12.7 g/dL — ABNORMAL LOW (ref 13.0–17.0)

## 2018-05-16 LAB — ABO/RH: ABO/RH(D): B POS

## 2018-05-16 LAB — POC OCCULT BLOOD, ED: Fecal Occult Bld: POSITIVE — AB

## 2018-05-16 MED ORDER — ONDANSETRON HCL 4 MG/2ML IJ SOLN: 4.0000 mg | Freq: Four times a day (QID) | INTRAMUSCULAR | Status: DC | PRN

## 2018-05-16 MED ORDER — ONDANSETRON HCL 4 MG PO TABS: 4.0000 mg | ORAL_TABLET | Freq: Four times a day (QID) | ORAL | Status: DC | PRN

## 2018-05-16 MED ORDER — PANTOPRAZOLE SODIUM 40 MG IV SOLR: 40.0000 mg | Freq: Two times a day (BID) | INTRAVENOUS | Status: DC

## 2018-05-16 MED ORDER — SODIUM CHLORIDE 0.9 % IV SOLN
8.0000 mg/h | INTRAVENOUS | Status: DC
  Administered 2018-05-16 – 2018-05-17 (×3): 8 mg/h via INTRAVENOUS
  Filled 2018-05-16 (×7): qty 80

## 2018-05-16 MED ORDER — SODIUM CHLORIDE 0.9 % IV SOLN
80.0000 mg | Freq: Once | INTRAVENOUS | Status: AC
  Administered 2018-05-16: 80 mg via INTRAVENOUS
  Filled 2018-05-16: qty 80

## 2018-05-16 NOTE — ED Provider Notes (Addendum)
[facility] EMERGENCY DEPARTMENT Provider Note   CSN: [id number] Arrival date & time: 05/16/18  1049    History   Chief Complaint No chief complaint on file.   HPI Trevor Mendez is a 38 y.o. male.     HPI Patient is a 38 year old male with a history of heavy over-the-counter pain medication use secondary to a history of migraine headaches.  He presents the emergency department with described melanotic stools over the past 2 days with an episode of vomiting bright red blood 2 days ago.  He has a prior history of alcohol abuse but reports sobriety for 5 years.  Denies fevers and chills.  No chest pain or shortness of breath.  No significant lightheadedness described.  Reports some mild upper abdominal discomfort.   Past Medical History:  Diagnosis Date  . DEPRESSION, MAJOR, SEVERE 09/13/2009   Qualifier: Diagnosis of  By: Debby Bud MD, Rosalyn Gess   . History of alcohol abuse   . HTN (hypertension) 10/09/2011  . MIGRAINE HEADACHE 04/10/2007   Qualifier: Diagnosis of  By: Genelle Gather CMA, [place]      Patient Active Problem List   Diagnosis Date Noted  . Sinusitis, acute 06/24/2014  . Health maintenance alteration 11/30/2012  . Alcohol dependence (HCC) 09/02/2012  . 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 Generalized anxiety disorder 08/27/2012  . HTN (hypertension) 10/09/2011  . DEPRESSION, MAJOR, SEVERE 09/13/2009  . MIGRAINE HEADACHE 04/10/2007    Past Surgical History:  Procedure Laterality Date  . finger surgury    . ORIF ANKLE FRACTURE Right   . WISDOM TOOTH EXTRACTION          Home Medications    Prior to Admission medications   Medication Sig Start Date End Date Taking? Authorizing Provider  doxycycline (VIBRA-TABS) 100 MG tablet PLEASE SEE ATTACHED FOR DETAILED DIRECTIONS 08/15/17   [provider]  hydrocortisone (ANUSOL-HC) 2.5 % rectal cream Place 1 application rectally 2 (two) times daily.  08/19/17   Waldon Merl, PA-C    Family History Family History  Problem Relation Age of Onset  . Hypertension Mother   . Hypertension Father   . Alcohol abuse Father   . Alcohol abuse Paternal Aunt   . Alcohol abuse Paternal Uncle   . Alcohol abuse Paternal Grandfather   . Schizophrenia Paternal Grandfather   . Alcohol abuse Paternal Grandmother     Social History Social History   Tobacco Use  . Smoking status: Former Games developer  . Smokeless tobacco: Current User    Types: Chew  Substance Use Topics  . 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   . Drug use: No    Frequency: 30.0 times per week    Types: Benzodiazepines    Comment: previous benzo addiction     Allergies   Codeine   Review of Systems Review of Systems  All other systems reviewed and are negative.    Physical Exam Updated Vital Signs There were no vitals taken for this visit.  Physical Exam Vitals signs and nursing note reviewed.  Constitutional:      Appearance: He is well-developed.  HENT:     Head: Normocephalic and atraumatic.  Neck:     Musculoskeletal: Normal range of motion.  Cardiovascular:     Rate and Rhythm: Regular rhythm. Tachycardia present.     Heart sounds: Normal heart sounds.  Pulmonary:     Effort: Pulmonary effort is normal. No respiratory  distress.     Breath sounds: Normal breath sounds.  Abdominal:     General: There is no distension.     Palpations: Abdomen is soft.     Tenderness: There is no abdominal tenderness.  Musculoskeletal: Normal range of motion.  Skin:    General: Skin is warm and dry.  Neurological:     Mental Status: He is alert and oriented to person, place, and time.  Psychiatric:        Judgment: Judgment normal.      ED Treatments / Results  Labs (all labs ordered are listed, but only abnormal results are displayed) Labs Reviewed  COMPREHENSIVE METABOLIC PANEL - Abnormal; Notable for the following  components:      Result Value   Glucose, Bld 112 (*)    Total Bilirubin 1.9 (*)    All other components within normal limits  LIPASE, BLOOD - Abnormal; Notable for the following components:   Lipase 54 (*)    All other components within normal limits  POC OCCULT BLOOD, ED - Abnormal; Notable for the following components:   Fecal Occult Bld POSITIVE (*)    All other components within normal limits  CBC  PROTIME-INR  TYPE AND SCREEN  ABO/RH    EKG None  Radiology No results found.  Procedures Procedures (including critical care time)  Medications Ordered in ED Medications  pantoprazole (PROTONIX) 80 mg in sodium chloride 0.9 % 250 mL (0.32 mg/mL) infusion (8 mg/hr Intravenous New Bag/Given 05/16/18 1159)  pantoprazole (PROTONIX) injection 40 mg (has no administration in time range)  pantoprazole (PROTONIX) 80 mg in sodium chloride 0.9 % 100 mL IVPB (0 mg Intravenous Stopped 05/16/18 1219)     Initial Impression / Assessment and Plan / ED Course  I have reviewed the triage vital signs and the nursing notes.  Pertinent labs & imaging results that were available during my care of the patient were reviewed by me and considered in my medical decision making (see chart for details).        Concern for upper GI bleed.  Stable vital signs at this time.  Presented somewhat tachycardic.  This improved with IV fluids.  He has had no hematemesis since yesterday.  He had a melanotic stool today.  He is Hemoccult positive.  This is likely gastritis/gastric/duodenal ulcer.  Patient will benefit from admission, IV Protonix, every 6 hours CBCs.  3:00 PM I spoke to Dr Russella Dar. His team will see the patient in the AM  Final Clinical Impressions(s) / ED Diagnoses   Final diagnoses:  Upper GI bleed    ED Discharge Orders    None       Azalia Bilis, MD 05/16/18 1427    Azalia Bilis, MD 05/16/18 1501

## 2018-05-16 NOTE — ED Triage Notes (Signed)
Pt to go to ED for further eval; pt ambulated out of [facility]

## 2018-05-16 NOTE — H&P (Signed)
History and Physical  Grantly Shaut BOF:[id number] DOB: 09-22-1980 DOA: 05/16/2018  Referring physician: Dr. Patria Mane, ED physician PCP: Waldon Merl, PA-C  Outpatient Specialists:   Patient Coming From: Home  Chief Complaint: Vomiting blood  HPI: Trevor Mendez is a 38 y.o. male with a history of migraines, hypertension.  Patient seen for a couple episodes of vomiting blood yesterday and black stools since yesterday.  He is continued to have melanotic stool today.  In addition, he describes 2 weeks of dull aching epigastric pain.  He has been taking a lot of Tums that helps for a brief period, then his discomfort increases again.  He does describe acid reflux and heartburn.  No other palliating or provoking factors.  Denies fevers, chills, nausea, vomiting.  Emergency Department Course: Hemoglobin 15.  Patient started on Protonix drip  Review of Systems:   Pt denies any fevers, chills, constipation, shortness of breath, dyspnea on exertion, orthopnea, cough, wheezing, palpitations, headache, vision changes, lightheadedness, dizziness, melena, rectal bleeding.  Review of systems are otherwise negative  Past Medical History:  Diagnosis Date  . DEPRESSION, MAJOR, SEVERE 09/13/2009   Qualifier: Diagnosis of  By: Debby Bud MD, Rosalyn Gess   . History of alcohol abuse   . HTN (hypertension) 10/09/2011  . MIGRAINE HEADACHE 04/10/2007   Qualifier: Diagnosis of  By: Genelle Gather CMA, [place]     Past Surgical History:  Procedure Laterality Date  . finger surgury    . ORIF ANKLE FRACTURE Right   . WISDOM TOOTH EXTRACTION     Social History:  reports that he has quit smoking. His smokeless tobacco use includes chew. He reports that he does not drink alcohol or use drugs. Patient lives at home  Allergies  Allergen Reactions  . Other Other (See Comments)    NARCOTICS. 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  . Codeine Nausea And Vomiting    Family History    Problem Relation Age of Onset  . Hypertension Mother   . Hypertension Father   . Alcohol abuse Father   . Alcohol abuse Paternal Aunt   . Alcohol abuse Paternal Uncle   . Alcohol abuse Paternal Grandfather   . Schizophrenia Paternal Grandfather   . Alcohol abuse Paternal Grandmother       Prior to Admission medications   Medication Sig Start Date End Date Taking? Authorizing Provider  aspirin-acetaminophen-caffeine (EXCEDRIN MIGRAINE) [phone] MG tablet Take 1-2 tablets by mouth every 6 (six) hours as needed for headache.   Yes [provider]  calcium carbonate (TUMS - DOSED IN MG ELEMENTAL CALCIUM) 500 MG chewable tablet Chew 1 tablet by mouth daily as needed for indigestion or heartburn.   Yes [provider]  ibuprofen (ADVIL,MOTRIN) 200 MG tablet Take 200-400 mg by mouth every 6 (six) hours as needed for mild pain.   Yes [provider]  hydrocortisone (ANUSOL-HC) 2.5 % rectal cream Place 1 application rectally 2 (two) times daily. Patient not taking: Reported on 05/16/2018 08/19/17   Waldon Merl, PA-C    Physical Exam: BP 117/85   Pulse 79   Temp 98.2 F (36.8 C) (Oral)   Resp 18   Ht 5\' 8"  (1.727 m)   Wt 63.5 kg   SpO2 99%   BMI 21.29 kg/m   . General: Young Caucasian male. Awake and alert and oriented x3. No acute cardiopulmonary distress.  Marland Kitchen HEENT: Normocephalic atraumatic.  Right and left ears normal in appearance.  Pupils equal, round, reactive  to light. Extraocular muscles are intact. Sclerae anicteric and noninjected.  Moist mucosal membranes. No mucosal lesions.  . Neck: Neck supple without lymphadenopathy. No carotid bruits. No masses palpated.  . Cardiovascular: Regular rate with normal S1-S2 sounds. No murmurs, rubs, gallops auscultated. No JVD.  Marland Kitchen Respiratory: Good respiratory effort with no wheezes, rales, rhonchi. Lungs clear to auscultation bilaterally.  No accessory muscle use. . Abdomen: Soft, mild tenderness in the  epigastric and left upper quadrant area without rebound or guarding, nondistended. Active bowel sounds. No masses or hepatosplenomegaly  . Skin: No rashes, lesions, or ulcerations.  Dry, warm to touch. 2+ dorsalis pedis and radial pulses. . Musculoskeletal: No calf or leg pain. All major joints not erythematous nontender.  No upper or lower joint deformation.  Good ROM.  No contractures  . Psychiatric: Intact judgment and insight. Pleasant and cooperative. . Neurologic: No focal neurological deficits. Strength is 5/5 and symmetric in upper and lower extremities.  Cranial nerves II through XII are grossly intact.           Labs on Admission: I have personally reviewed following labs and imaging studies  CBC: Recent Labs  Lab 05/16/18 1140  WBC 9.6  HGB 15.2  HCT 44.2  MCV 87.9  PLT 286   Basic Metabolic Panel: Recent Labs  Lab 05/16/18 1140  NA 138  K 3.8  CL 104  CO2 26  GLUCOSE 112*  BUN 19  CREATININE 1.01  CALCIUM 9.3   GFR: Estimated Creatinine Clearance: 90.8 mL/min (by C-G formula based on SCr of 1.01 mg/dL). Liver Function Tests: Recent Labs  Lab 05/16/18 1140  AST 28  ALT 24  ALKPHOS 61  BILITOT 1.9*  PROT 7.3  ALBUMIN 4.3   Recent Labs  Lab 05/16/18 1140  LIPASE 54*   No results for input(s): AMMONIA in the last 168 hours. Coagulation Profile: Recent Labs  Lab 05/16/18 1140  INR 1.05   Cardiac Enzymes: No results for input(s): CKTOTAL, CKMB, CKMBINDEX, TROPONINI in the last 168 hours. BNP (last 3 results) No results for input(s): PROBNP in the last 8760 hours. HbA1C: No results for input(s): HGBA1C in the last 72 hours. CBG: No results for input(s): GLUCAP in the last 168 hours. Lipid Profile: No results for input(s): CHOL, HDL, LDLCALC, TRIG, CHOLHDL, LDLDIRECT in the last 72 hours. Thyroid Function Tests: No results for input(s): TSH, T4TOTAL, FREET4, T3FREE, THYROIDAB in the last 72 hours. Anemia Panel: No results for input(s):  VITAMINB12, FOLATE, FERRITIN, TIBC, IRON, RETICCTPCT in the last 72 hours. Urine analysis: No results found for: COLORURINE, APPEARANCEUR, LABSPEC, PHURINE, GLUCOSEU, HGBUR, BILIRUBINUR, KETONESUR, PROTEINUR, UROBILINOGEN, NITRITE, LEUKOCYTESUR Sepsis Labs: @LABRCNTIP (procalcitonin:4,lacticidven:4) )No results found for this or any previous visit (from the past 240 hour(s)).   Radiological Exams on Admission: No results found.  Assessment/Plan: Active Problems:   Upper GI bleed    This patient was discussed with the ED physician, including pertinent vitals, physical exam findings, labs, and imaging.  We also discussed care given by the ED provider.  1. Upper GI bleed a. Observation b. Continue Protonix drip c. Repeat CBC this afternoon and this evening and tomorrow morning. d. Clear liquid e. H. pylori testing f. I did ask the ED to discuss patient with GI to arrange follow-up g. Discharge tomorrow  DVT prophylaxis: SCDs Consultants: None Code Status: Full code Family Communication: None Disposition Plan: Patient to return home following improvement of symptoms   Levie Heritage, DO

## 2018-05-16 NOTE — Progress Notes (Signed)
Update provided at bedside with pt and pt family per pt request

## 2018-05-16 NOTE — ED Triage Notes (Signed)
Pt reports 1 episode yesterday of vomiting with some blood @ 0230 in the morning. Pt also reports he started to have dark black tarry stools that began yesterday at 1500 hrs this date. Pt states he is a recovering addict, alcohol and pills.

## 2018-05-17 ENCOUNTER — Observation Stay (HOSPITAL_COMMUNITY): Payer: 59 | Admitting: Certified Registered Nurse Anesthetist

## 2018-05-17 ENCOUNTER — Encounter (HOSPITAL_COMMUNITY)
Admission: EM | Disposition: A | Discharge status: Home / Self Care | Payer: Self-pay | Source: Home / Self Care | Attending: Emergency Medicine

## 2018-05-17 ENCOUNTER — Encounter (HOSPITAL_COMMUNITY): Payer: Self-pay

## 2018-05-17 DIAGNOSIS — K259 Gastric ulcer, unspecified as acute or chronic, without hemorrhage or perforation: Secondary | ICD-10-CM | POA: Diagnosis present

## 2018-05-17 DIAGNOSIS — K921 Melena: Secondary | ICD-10-CM

## 2018-05-17 DIAGNOSIS — K250 Acute gastric ulcer with hemorrhage: Secondary | ICD-10-CM

## 2018-05-17 DIAGNOSIS — K449 Diaphragmatic hernia without obstruction or gangrene: Secondary | ICD-10-CM

## 2018-05-17 DIAGNOSIS — K269 Duodenal ulcer, unspecified as acute or chronic, without hemorrhage or perforation: Secondary | ICD-10-CM | POA: Diagnosis not present

## 2018-05-17 DIAGNOSIS — K209 Esophagitis, unspecified: Secondary | ICD-10-CM

## 2018-05-17 DIAGNOSIS — F1990 Other psychoactive substance use, unspecified, uncomplicated: Secondary | ICD-10-CM | POA: Diagnosis not present

## 2018-05-17 DIAGNOSIS — K3189 Other diseases of stomach and duodenum: Secondary | ICD-10-CM

## 2018-05-17 DIAGNOSIS — G43809 Other migraine, not intractable, without status migrainosus: Secondary | ICD-10-CM

## 2018-05-17 DIAGNOSIS — D649 Anemia, unspecified: Secondary | ICD-10-CM

## 2018-05-17 DIAGNOSIS — D62 Acute posthemorrhagic anemia: Secondary | ICD-10-CM

## 2018-05-17 DIAGNOSIS — K922 Gastrointestinal hemorrhage, unspecified: Secondary | ICD-10-CM | POA: Diagnosis not present

## 2018-05-17 HISTORY — PX: ESOPHAGOGASTRODUODENOSCOPY (EGD) WITH PROPOFOL: SHX5813

## 2018-05-17 HISTORY — PX: BIOPSY: SHX5522

## 2018-05-17 LAB — HEMOGLOBIN AND HEMATOCRIT, BLOOD
HCT: 36.3 % — ABNORMAL LOW (ref 39.0–52.0)
Hemoglobin: 12.2 g/dL — ABNORMAL LOW (ref 13.0–17.0)

## 2018-05-17 LAB — CBC
HEMATOCRIT: 38.7 % — AB (ref 39.0–52.0)
Hemoglobin: 12.8 g/dL — ABNORMAL LOW (ref 13.0–17.0)
MCH: 29.4 pg (ref 26.0–34.0)
MCHC: 33.1 g/dL (ref 30.0–36.0)
MCV: 89 fL (ref 80.0–100.0)
Platelets: 250 10*3/uL (ref 150–400)
RBC: 4.35 MIL/uL (ref 4.22–5.81)
RDW: 12.3 % (ref 11.5–15.5)
WBC: 9.2 10*3/uL (ref 4.0–10.5)
nRBC: 0 % (ref 0.0–0.2)

## 2018-05-17 SURGERY — ESOPHAGOGASTRODUODENOSCOPY (EGD) WITH PROPOFOL
Anesthesia: Monitor Anesthesia Care

## 2018-05-17 MED ORDER — MAGNESIUM CHLORIDE 64 MG PO TBEC
1.0000 | DELAYED_RELEASE_TABLET | Freq: Every day | ORAL | Status: DC
  Filled 2018-05-17: qty 1

## 2018-05-17 MED ORDER — PANTOPRAZOLE SODIUM 40 MG PO TBEC: 40.0000 mg | DELAYED_RELEASE_TABLET | Freq: Two times a day (BID) | ORAL | 1 refills | Status: DC

## 2018-05-17 MED ORDER — LACTATED RINGERS IV SOLN
INTRAVENOUS | Status: DC
  Administered 2018-05-17: 11:00:00 via INTRAVENOUS

## 2018-05-17 MED ORDER — SODIUM CHLORIDE 0.9 % IV SOLN: INTRAVENOUS | Status: DC

## 2018-05-17 MED ORDER — PROPOFOL 10 MG/ML IV BOLUS
INTRAVENOUS | Status: DC | PRN
  Administered 2018-05-17: 40 mg via INTRAVENOUS

## 2018-05-17 MED ORDER — MAGNESIUM CHLORIDE 64 MG PO TBEC: 1.0000 | DELAYED_RELEASE_TABLET | Freq: Every day | ORAL | Status: DC

## 2018-05-17 MED ORDER — PROPOFOL 500 MG/50ML IV EMUL
INTRAVENOUS | Status: DC | PRN
  Administered 2018-05-17: 200 ug/kg/min via INTRAVENOUS

## 2018-05-17 NOTE — Discharge Instructions (Signed)
Can use magnesium or vitamin B2 for migraines - Full liquid diet for 2 days. - PO PPI BID    - Await pathology results.    - Repeat upper endoscopy in 2 months to check                            healing.

## 2018-05-17 NOTE — Op Note (Signed)
[facility] Patient Name: Trevor Mendez Procedure Date : 05/17/2018 MRN: [id number] Attending MD: Justice Britain , MD Date of Birth: 10/14/1980 CSN: [id number] Age: 38 Admit Type: Inpatient Procedure:                Upper GI endoscopy Indications:              Epigastric abdominal pain, Acute post hemorrhagic                            anemia, Melena Providers:                Justice Britain, MD, Jeanella Cara, RN,                            Elspeth Cho Tech., Technician, Tawni Carnes,                            CRNA Referring MD:             Tomi Bamberger. Vann Medicines:                Monitored Anesthesia Care Complications:            No immediate complications. Estimated Blood Loss:     Estimated blood loss was minimal. Procedure:                Pre-Anesthesia Assessment:                           - Prior to the procedure, a History and Physical                            was performed, and patient medications and                            allergies were reviewed. The patient's tolerance of                            previous anesthesia was also reviewed. The risks                            and benefits of the procedure and the sedation                            options and risks were discussed with the patient.                            All questions were answered, and informed consent                            was obtained. Prior Anticoagulants: The patient has                            taken previous NSAID medication. ASA Grade                            Assessment: II -  A patient with mild systemic                            disease. After reviewing the risks and benefits,                            the patient was deemed in satisfactory condition to                            undergo the procedure.                           After obtaining informed consent, the endoscope was                            passed under direct vision. Throughout  the                            procedure, the patient's blood pressure, pulse, and                            oxygen saturations were monitored continuously. The                            GIF-H190 ([id number] Olympus gastroscope was                            introduced through the mouth, and advanced to the                            second part of duodenum. The upper GI endoscopy was                            accomplished without difficulty. The patient                            tolerated the procedure. Scope In: Scope Out: Findings:      No gross lesions were noted in the proximal esophagus and in the mid       esophagus.      LA Grade D (one or more mucosal breaks involving at least 75% of       esophageal circumference) esophagitis with no bleeding was found in the       distal esophagus. Biopsies were taken with a cold forceps for histology       to rule in/out Barrett's.      A 4 cm hiatal hernia was found. The proximal extent of the gastric folds       (end of tubular esophagus) was 36 cm from the incisors. The hiatal       narrowing was 40 cm from the incisors. The Z-line was 36 cm from the       incisors.      Multiple non-bleeding dispersed erosions were found in the cardia and in       the gastric fundus - within the hiatal hernia -> consistent with       Cameron's lesions.  Multiple dispersed, small non-bleeding erosions were found in the       gastric body, in the gastric antrum and in the prepyloric region of the       stomach. There were no stigmata of recent bleeding.      Three non-bleeding superficial gastric ulcers with a clean ulcer base       (Forrest Class III) were found in the gastric antrum. The largest lesion       was 10 mm in largest dimension.      No other gross lesions were noted in the entire examined stomach.       Biopsies were taken with a cold forceps for histology and Helicobacter       pylori testing.      Diffuse moderately erythematous  mucosa and with no stigmata of bleeding       was found in the duodenal bulb, in the D1/D2 angle/sweep.      Few non-bleeding superficial duodenal ulcers with a clean ulcer base       (Forrest Class III) were found in the duodenal bulb, in the D1/D2       angle/sweep. The largest lesion was 8 mm in largest dimension.      Normal mucosa was found in the second portion of the duodenum. Impression:               - No gross lesions in proximal/middle esophagus. LA                            Grade D esophagitis. Biopsied.                           - 4 cm hiatal hernia. Cameron's erosions without                            bleeding found within hernia.                           - Non-bleeding erosive gastropathy. Multiple                            non-bleeding gastric ulcers with a clean ulcer base                            (Forrest Class III). Biopsied for HP.                           - Erythematous duodenopathy. Multiple non-bleeding                            duodenal ulcers with a clean ulcer base (Forrest                            Class III).                           - Normal mucosa was found in the second portion of  the duodenum. Recommendation:           - The patient will be observed post-procedure,                            until all discharge criteria are met.                           - Discharge patient to home.                           - Patient has a contact number available for                            emergencies. The signs and symptoms of potential                            delayed complications were discussed with the                            patient. Return to normal activities tomorrow.                            Written discharge instructions were provided to the                            patient.                           - Full liquid diet for 2 days.                           - Transition to PO PPI BID                           -  Await pathology results.                           - Observe patient's clinical course.                           - Repeat upper endoscopy in 2 months to check                            healing.                           - The findings and recommendations were discussed                            with the patient.                           - The findings and recommendations were discussed                            with the patient's family. Procedure Code(s):        ---  Professional ---                           [phone], Esophagogastroduodenoscopy, flexible,                            transoral; with biopsy, single or multiple Diagnosis Code(s):        --- Professional ---                           K20.9, Esophagitis, unspecified                           K44.9, Diaphragmatic hernia without obstruction or                            gangrene                           K25.9, Gastric ulcer, unspecified as acute or                            chronic, without hemorrhage or perforation                           K31.89, Other diseases of stomach and duodenum                           K26.9, Duodenal ulcer, unspecified as acute or                            chronic, without hemorrhage or perforation                           R10.13, Epigastric pain                           D62, Acute posthemorrhagic anemia                           K92.1, Melena (includes Hematochezia) CPT copyright 2018 American Medical Association. All rights reserved. The codes documented in this report are preliminary and upon coder review may  be revised to meet current compliance requirements. Justice Britain, MD 05/17/2018 1:38:55 PM Number of Addenda: 0

## 2018-05-17 NOTE — Transfer of Care (Signed)
Immediate Anesthesia Transfer of Care Note  Patient: Trevor Mendez  Procedure(s) Performed: ESOPHAGOGASTRODUODENOSCOPY (EGD) WITH PROPOFOL (N/A ) BIOPSY  Patient Location: Endoscopy Unit  Anesthesia Type:MAC  Level of Consciousness: awake, alert , oriented and patient cooperative  Airway & Oxygen Therapy: Patient Spontanous Breathing and Patient connected to nasal cannula oxygen  Post-op Assessment: Report given to RN and Post -op Vital signs reviewed and stable  Post vital signs: Reviewed and stable  Last Vitals:  Vitals Value Taken Time  BP 118/70 05/17/2018  1:14 PM  Temp 36.6 C 05/17/2018  1:14 PM  Pulse 99 05/17/2018  1:14 PM  Resp 14 05/17/2018  1:14 PM  SpO2 98 % 05/17/2018  1:14 PM    Last Pain:  Vitals:   05/17/18 1314  TempSrc: Axillary  PainSc: 0-No pain         Complications: No apparent anesthesia complications

## 2018-05-17 NOTE — Anesthesia Preprocedure Evaluation (Signed)
Anesthesia Evaluation  Patient identified by MRN, date of birth, ID band Patient awake    Reviewed: Allergy & Precautions, NPO status , Patient's Chart, lab work & pertinent test results  Airway Mallampati: I  TM Distance: >3 FB Neck ROM: Full    Dental   Pulmonary former smoker,    Pulmonary exam normal        Cardiovascular hypertension, Pt. on medications Normal cardiovascular exam     Neuro/Psych Anxiety Depression    GI/Hepatic   Endo/Other    Renal/GU      Musculoskeletal   Abdominal   Peds  Hematology   Anesthesia Other Findings   Reproductive/Obstetrics                             Anesthesia Physical Anesthesia Plan  ASA: II  Anesthesia Plan: MAC   Post-op Pain Management:    Induction: Intravenous  PONV Risk Score and Plan: 1 and Treatment may vary due to age or medical condition  Airway Management Planned: Simple Face Mask  Additional Equipment:   Intra-op Plan:   Post-operative Plan:   Informed Consent: I have reviewed the patients History and Physical, chart, labs and discussed the procedure including the risks, benefits and alternatives for the proposed anesthesia with the patient or authorized representative who has indicated his/her understanding and acceptance.       Plan Discussed with: CRNA and Surgeon  Anesthesia Plan Comments: (Recovered Addict - no Narcotics per pt request.)        Anesthesia Quick Evaluation

## 2018-05-17 NOTE — Consult Note (Addendum)
Referring Provider: [facility]    Primary Care Physician:  Waldon Merl, PA-C Primary Gastroenterologist:  None     Reason for Consultation:   melena    ASSESSMENT / PLAN:     1. 38 yo male admitted with epigastric pain, hematemesis and melena in setting of NSAID. Hemodynamically stable, Normal BUN. Rule out PUD -continue PPI -Needs EGD. The risks and benefits of EGD were discussed and the patient agrees to proceed. Will see if procedure can be done today, if not will give clears and plan for tomorrow -If proves to have PUD will need alternative treatment for chronic headaches  2. Acute blood loss anemia. Hgb 12.8, down ~ 3 grams from baseline of 15.2.  -monitor H+H, he is currently stable in mid 12 range.      HPI:      HPI: Trevor Mendez is a 38 y.o. male with history of migraines admitted yesterday for epigastric pain, hematemesis and melena. He has been having 'indigestion" type discomfort for a couple of weeks. The pain is unrelated to eating. He has been taking Tums with only mild and temporary relief. On Saturday, around 2am, he vomited bright red blood and subsequently starting passing black stools. He has migraine headaches and regularly takes ASA, motrin, Goody powders, etc.  Patient has no other GI complaints. He gives a history of intermittent GERD symptoms for which he has taken Prilosec in the past. Non-drinker.    Data Reviewed:   Lipase 54,  Baseline hgb 15.2 Hgb 12.8, MCV 89 BUN 19 Tbili 1.9, remaining liver tests normal.   Past Medical History:  Diagnosis Date  . DEPRESSION, MAJOR, SEVERE 09/13/2009   Qualifier: Diagnosis of  By: Debby Bud MD, Rosalyn Gess   . History of alcohol abuse   . HTN (hypertension) 10/09/2011  . MIGRAINE HEADACHE 04/10/2007   Qualifier: Diagnosis of  By: Genelle Gather CMA, [place]      Past Surgical History:  Procedure Laterality Date  . finger surgury    . ORIF ANKLE FRACTURE Right   . WISDOM TOOTH EXTRACTION        Prior to Admission medications   Medication Sig Start Date End Date Taking? Authorizing Provider  aspirin-acetaminophen-caffeine (EXCEDRIN MIGRAINE) [phone] MG tablet Take 1-2 tablets by mouth every 6 (six) hours as needed for headache.   Yes [provider]  calcium carbonate (TUMS - DOSED IN MG ELEMENTAL CALCIUM) 500 MG chewable tablet Chew 1 tablet by mouth daily as needed for indigestion or heartburn.   Yes [provider]  ibuprofen (ADVIL,MOTRIN) 200 MG tablet Take 200-400 mg by mouth every 6 (six) hours as needed for mild pain.   Yes [provider]  hydrocortisone (ANUSOL-HC) 2.5 % rectal cream Place 1 application rectally 2 (two) times daily. Patient not taking: Reported on 05/16/2018 08/19/17   Waldon Merl, PA-C    Current Facility-Administered Medications  Medication Dose Route Frequency Provider Last Rate Last Dose  . ondansetron (ZOFRAN) tablet 4 mg  4 mg Oral Q6H PRN Levie Heritage, DO       Or  . ondansetron [facility]) injection 4 mg  4 mg Intravenous Q6H PRN Levie Heritage, DO      . pantoprazole (PROTONIX) 80 mg in sodium chloride 0.9 % 250 mL (0.32 mg/mL) infusion  8 mg/hr Intravenous Continuous Levie Heritage, DO 25 mL/hr at 05/17/18 0847 8 mg/hr at 05/17/18 0847  . [START ON 05/19/2018] pantoprazole (PROTONIX) injection 40 mg  40 mg  Intravenous Q12H Levie Heritage, DO        Allergies as of 05/16/2018 - Review Complete 05/16/2018  Allergen Reaction Noted  . Other Other (See Comments) 05/16/2018  . Codeine Nausea And Vomiting 10/09/2011    Family History  Problem Relation Age of Onset  . Hypertension Mother   . Hypertension Father   . Alcohol abuse Father   . Alcohol abuse Paternal Aunt   . Alcohol abuse Paternal Uncle   . Alcohol abuse Paternal Grandfather   . Schizophrenia Paternal Grandfather   . Alcohol abuse Paternal Grandmother     Social History   Socioeconomic History  . Marital status: Married    Spouse  name: Not on file  . Number of children: 1  . Years of education: 67  . Highest education level: Not on file  Occupational History  . Occupation: Chiropodist: [employer]  Social Needs  . Financial resource strain: Not on file  . Food insecurity:    Worry: Not on file    Inability: Not on file  . Transportation needs:    Medical: Not on file    Non-medical: Not on file  Tobacco Use  . Smoking status: Former Games developer  . Smokeless tobacco: Current User    Types: Chew  Substance and Sexual Activity  . Alcohol use: No    Comment: recovering alcoholic -- 68 months sober.   . Drug use: No    Frequency: 30.0 times per week    Types: Benzodiazepines    Comment: previous benzo addiction  . Sexual activity: Yes    Partners: Female  Lifestyle  . Physical activity:    Days per week: Not on file    Minutes per session: Not on file  . Stress: Not on file  Relationships  . Social connections:    Talks on phone: Not on file    Gets together: Not on file    Attends religious service: Not on file    Active member of club or organization: Not on file    Attends meetings of clubs or organizations: Not on file    Relationship status: Not on file  . Intimate partner violence:    Fear of current or ex partner: Not on file    Emotionally abused: Not on file    Physically abused: Not on file    Forced sexual activity: Not on file  Other Topics Concern  . 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      09/03/2012 [name] was born and grew up in [place], [place]. He has a younger sister. His parents are still together and healthy. He graduated from high school, and has all but a degree at [employer] in Musician. 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 He has been married for 5 years, and has an 5-month-old daughter. He currently lives with his  wife and daughter. 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 His social support system consists of his sponsor, wife, mother, father, sister, brother-in-law, and friends. He affiliates as a International aid/development worker. He denies any legal problems. He enjoys golf and firearms. 09/03/2012 AHW    Review of Systems: All systems reviewed and negative except where noted in HPI.  Physical Exam: Vital signs in last 24 hours: Temp:  [98 F (36.7 C)-98.5 F (36.9 C)] 98.2  F (36.8 C) (02/23 2347) Pulse Rate:  [52-116] 52 (02/23 2347) Resp:  [18-19] 18 (02/23 2347) BP: (107-157)/(72-104) 107/72 (02/23 2347) SpO2:  [97 %-100 %] 97 % (02/23 2347) Weight:  [63.5 kg] 63.5 kg (02/23 1134) Last BM Date: 05/16/18 General:   Alert,  Thin male in NAD Psych:  Pleasant, cooperative. Normal mood and affect. Eyes:  Pupils equal, sclera clear, no icterus.   Conjunctiva pink. Ears:  Normal auditory acuity. Nose:  No deformity, discharge,  or lesions. Neck:  Supple; no masses Lungs:  Clear throughout to auscultation.   No wheezes, crackles, or rhonchi.  Heart:  Regular rate and rhythm; no murmurs, no lower extremity edema Abdomen:  Soft, non-distended, mild epigastric tenderness, BS active, no palp mass    Rectal:  Deferred  Msk:  Symmetrical without gross deformities. . Neurologic:  Alert and  oriented x4;  grossly normal neurologically. Skin:  Intact without significant lesions or rashes.   Intake/Output from previous day: 02/23 0701 - 02/24 0700 In: 1169.5 [P.O.:720; I.V.:375.2; IV Piggyback:74.2] Out: -  Intake/Output this shift: No intake/output data recorded.  Lab Results: Recent Labs    05/16/18 1140 05/16/18 1820 05/16/18 2338 05/17/18 0540  WBC 9.6  --   --  9.2  HGB 15.2 12.7* 12.2* 12.8*  HCT 44.2 37.6* 36.3* 38.7*  PLT 286  --   --  250   BMET Recent Labs    05/16/18 1140  NA 138  K 3.8  CL 104  CO2 26  GLUCOSE 112*  BUN 19  CREATININE 1.01  CALCIUM 9.3   LFT Recent Labs      05/16/18 1140  PROT 7.3  ALBUMIN 4.3  AST 28  ALT 24  ALKPHOS 61  BILITOT 1.9*   PT/INR Recent Labs    05/16/18 1140  LABPROT 13.6  INR 1.05    Willette Cluster, NP-C @  05/17/2018, 9:23 AM

## 2018-05-17 NOTE — Progress Notes (Signed)
[facility] PROGRESS NOTE  Trevor Mendez EML:[id number] DOB: 01/01/1981 DOA: 05/16/2018 PCP: Waldon Merl, PA-C  Assessment/Plan: 1. Upper GI bleed. Pt with long hx of nsaid use due to chronic headaches. Prefers Goodie powders. Report 5-6 daily. No further hematemesis. Hg stable. EGD reveals gastric ulcer, duodenal ulcer, esophagitis. Recommending PPI BID, full liquid 2 days and overnight obs. Follow up H. Pylori testing OP. Will change PPI to oral. Provide full liquid diet. Likely discharge in am 2. Anemia. Hg stable. Related to above. Will need OP follow up 3. Hypertension. Controlled. Not taking any anti-hypertensive meds. Monitor OP   Code Status: full Family Communication: wife at bedside Disposition Plan: home tomorrow   Consultants:  mansouraty GI  Procedures: EGD 2/23 No gross lesions in proximal/middle esophagus. LA Grade D esophagitis. Biopsied. - 4 cm hiatal hernia. Cameron's erosions without bleeding found within hernia. - Non-bleeding erosive gastropathy. Multiple non-bleeding gastric ulcers with a clean ulcer base (Forrest Class III). Biopsied for HP. - Erythematous duodenopathy. Multiple non-bleeding duodenal ulcers with a clean ulcer base (Forrest Class III).  - Normal mucosa was found in the second portion of the duodenum.  Antibiotics:    HPI/Subjective: Admitted with gi bleed presenting with 2 episodes of hematemesis and melena. Long history of nsaid use and remote ETOH abuse. Stable post procedure.   Objective: Vitals:   05/17/18 1325 05/17/18 1345  BP: (!) 111/55 113/71  Pulse: 77 76  Resp: 17 16  Temp:    SpO2: 99% 100%    Intake/Output Summary (Last 24 hours) at 05/17/2018 1427 Last data filed at 05/17/2018 1306 Gross per 24 hour  Intake 1769.46 ml  Output -  Net 1769.46 ml   Filed Weights   05/16/18 1134  Weight: 63.5 kg    Exam:   General:  Awake alert oriented x3. No acute distress  Cardiovascular: rrr no mgr  no LE edema  Respiratory: normal effort BS clear bilaterally no wheeze  Abdomen: flat soft +BS mild tenderness epigastric area. No guarding or rebounding  Musculoskeletal: joints without swelling/erythema   Data Reviewed: Basic Metabolic Panel: Recent Labs  Lab 05/16/18 1140  NA 138  K 3.8  CL 104  CO2 26  GLUCOSE 112*  BUN 19  CREATININE 1.01  CALCIUM 9.3   Liver Function Tests: Recent Labs  Lab 05/16/18 1140  AST 28  ALT 24  ALKPHOS 61  BILITOT 1.9*  PROT 7.3  ALBUMIN 4.3   Recent Labs  Lab 05/16/18 1140  LIPASE 54*   No results for input(s): AMMONIA in the last 168 hours. CBC: Recent Labs  Lab 05/16/18 1140 05/16/18 1820 05/16/18 2338 05/17/18 0540  WBC 9.6  --   --  9.2  HGB 15.2 12.7* 12.2* 12.8*  HCT 44.2 37.6* 36.3* 38.7*  MCV 87.9  --   --  89.0  PLT 286  --   --  250   Cardiac Enzymes: No results for input(s): CKTOTAL, CKMB, CKMBINDEX, TROPONINI in the last 168 hours. BNP (last 3 results) No results for input(s): BNP in the last 8760 hours.  ProBNP (last 3 results) No results for input(s): PROBNP in the last 8760 hours.  CBG: No results for input(s): GLUCAP in the last 168 hours.  No results found for this or any previous visit (from the past 240 hour(s)).   Studies: No results found.  Scheduled Meds: . [START ON 05/19/2018] pantoprazole  40 mg Intravenous Q12H   Continuous Infusions: . pantoprozole (PROTONIX) infusion 8 mg/hr (  05/17/18 0847)    Principal Problem:   Upper GI bleed Active Problems:   Duodenal ulcer   Gastric ulcer   HTN (hypertension)   Hiatal hernia    Time spent: 69    [facility] M NP  [facility]  If 7PM-7AM, please contact night-coverage at [email or web address], password [facility] 05/17/2018, 2:27 PM  LOS: 0 days

## 2018-05-17 NOTE — Consult Note (Signed)
GASTROENTEROLOGY OUTPATIENT PROCEDURE H&P NOTE   Primary Care Physician: Waldon Merl, PA-C  HPI: Trevor Mendez is a 38 y.o. male who presents for EGD.  Past Medical History:  Diagnosis Date  . DEPRESSION, MAJOR, SEVERE 09/13/2009   Qualifier: Diagnosis of  By: Debby Bud MD, Rosalyn Gess   . History of alcohol abuse   . HTN (hypertension) 10/09/2011  . MIGRAINE HEADACHE 04/10/2007   Qualifier: Diagnosis of  By: Genelle Gather CMA, [place]     Past Surgical History:  Procedure Laterality Date  . finger surgury    . ORIF ANKLE FRACTURE Right   . WISDOM TOOTH EXTRACTION     Current Facility-Administered Medications  Medication Dose Route Frequency Provider Last Rate Last Dose  . 0.9 %  sodium chloride infusion   Intravenous Continuous Mansouraty, Netty Starring., MD      . lactated ringers infusion   Intravenous Continuous Mansouraty, Netty Starring., MD 10 mL/hr at 05/17/18 1109    . [MAR Hold] ondansetron (ZOFRAN) tablet 4 mg  4 mg Oral Q6H PRN Levie Heritage, DO       Or  . Mitzi Hansen Hold] ondansetron [facility]) injection 4 mg  4 mg Intravenous Q6H PRN Levie Heritage, DO      . pantoprazole (PROTONIX) 80 mg in sodium chloride 0.9 % 250 mL (0.32 mg/mL) infusion  8 mg/hr Intravenous Continuous Levie Heritage, DO 25 mL/hr at 05/17/18 0847 8 mg/hr at 05/17/18 0847  . [MAR Hold] pantoprazole (PROTONIX) injection 40 mg  40 mg Intravenous Q12H Levie Heritage, DO       Allergies  Allergen Reactions  . Other Other (See Comments)    NARCOTICS. 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  . Codeine Nausea And Vomiting   Family History  Problem Relation Age of Onset  . Hypertension Mother   . Hypertension Father   . Alcohol abuse Father   . Alcohol abuse Paternal Aunt   . Alcohol abuse Paternal Uncle   . Alcohol abuse Paternal Grandfather   . Schizophrenia Paternal Grandfather   . Alcohol abuse Paternal Grandmother    Social History   Socioeconomic History  . Marital  status: Married    Spouse name: Not on file  . Number of children: 1  . Years of education: 16  . Highest education level: Not on file  Occupational History  . Occupation: Chiropodist: [employer]  Social Needs  . Financial resource strain: Not on file  . Food insecurity:    Worry: Not on file    Inability: Not on file  . Transportation needs:    Medical: Not on file    Non-medical: Not on file  Tobacco Use  . Smoking status: Former Games developer  . Smokeless tobacco: Current User    Types: Chew  Substance and Sexual Activity  . Alcohol use: No    Comment: recovering alcoholic -- 68 months sober.   . Drug use: No    Frequency: 30.0 times per week    Types: Benzodiazepines    Comment: previous benzo addiction  . Sexual activity: Yes    Partners: Female  Lifestyle  . Physical activity:    Days per week: Not on file    Minutes per session: Not on file  . Stress: Not on file  Relationships  . Social connections:    Talks on phone: Not on file    Gets together: Not on file    Attends religious  service: Not on file    Active member of club or organization: Not on file    Attends meetings of clubs or organizations: Not on file    Relationship status: Not on file  . Intimate partner violence:    Fear of current or ex partner: Not on file    Emotionally abused: Not on file    Physically abused: Not on file    Forced sexual activity: Not on file  Other Topics Concern  . 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      09/03/2012 [name] was born and grew up in [place], [place]. He has a younger sister. His parents are still together and healthy. He graduated from high school, and has all but a degree at [employer] in Musician. 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 He has been married for 5 years, and has an 24-month-old daughter. He  currently lives with his wife and daughter. 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 His social support system consists of his sponsor, wife, mother, father, sister, brother-in-law, and friends. He affiliates as a International aid/development worker. He denies any legal problems. He enjoys golf and firearms. 09/03/2012 AHW    Physical Exam: Vital signs in last 24 hours: Temp:  [98 F (36.7 C)-98.5 F (36.9 C)] 98 F (36.7 C) (02/24 1107) Pulse Rate:  [50-93] 50 (02/24 1107) Resp:  [18-23] 23 (02/24 1107) BP: (107-134)/(72-87) 134/73 (02/24 1107) SpO2:  [97 %-100 %] 100 % (02/24 1107) Last BM Date: 05/16/18 GEN: NAD EYE: Sclerae anicteric ENT: MMM CV: RR without R/Gs  RESP: CTAB posteriorly GI: Soft, NT/ND NEURO:  Alert & Oriented x 3  Lab Results: Recent Labs    05/16/18 1140 05/16/18 1820 05/16/18 2338 05/17/18 0540  WBC 9.6  --   --  9.2  HGB 15.2 12.7* 12.2* 12.8*  HCT 44.2 37.6* 36.3* 38.7*  PLT 286  --   --  250   BMET Recent Labs    05/16/18 1140  NA 138  K 3.8  CL 104  CO2 26  GLUCOSE 112*  BUN 19  CREATININE 1.01  CALCIUM 9.3   LFT Recent Labs    05/16/18 1140  PROT 7.3  ALBUMIN 4.3  AST 28  ALT 24  ALKPHOS 61  BILITOT 1.9*   PT/INR Recent Labs    05/16/18 1140  LABPROT 13.6  INR 1.05     Impression / Plan: This is a 38 y.o.male who presents for EGD.  The risks and benefits of endoscopic evaluation were discussed with the patient; these include but are not limited to the risk of perforation, infection, bleeding, missed lesions, lack of diagnosis, severe illness requiring hospitalization, as well as anesthesia and sedation related illnesses.  The patient is agreeable to proceed.    Corliss Parish, MD [facility] Advanced Endoscopy Office # [phone]

## 2018-05-17 NOTE — Discharge Summary (Signed)
Physician Discharge Summary  Trevor Mendez XLK:[id number] DOB: [birth year] DOA: 05/16/2018  PCP: Waldon Merl, PA-C  Admit date: 05/16/2018 Discharge date: 05/17/2018  Time spent: 45 minutes  Recommendations for Outpatient Follow-up:  1. No NSAID use 2. protonix twice daily 3. Follow up with gastroenterology for post hospitalization visit and scheduling of OP repeat EGD 4. Follow up with PCP for evaluation of headaches and monitoring cbc   Discharge Diagnoses:  Principal Problem:   Upper GI bleed Active Problems:   Duodenal ulcer   Gastric ulcer   HTN (hypertension)   Hiatal hernia   Discharge Condition: stable   Diet recommendation: full liquid for 2 days then regular  Filed Weights   05/16/18 1134  Weight: 63.5 kg    History of present illness:  Trevor Mendez is a 38 y.o. male with history of migraines admitted 05/16/18 for epigastric pain, hematemesis and melena. He had been having 'indigestion" type discomfort for a couple of weeks. The pain seems unrelated to eating. He had been taking Tums with only mild and temporary relief. Two days prior to presentation he vomited bright red blood and subsequently starting passing Lacoya Wilbanks stools. He has migraine headaches and regularly takes ASA, motrin, Goody powders, etc.    Hospital Course:   1. Upper GI bleed. Pt with long hx of nsaid use due to chronic headaches. Prefers Goodie powders. Reports taking 5-6 daily. No further hematemesis. Hg stable. EGD revealed gastric ulcer, duodenal ulcer, esophagitis. Recommending PPI BID, full liquid 2 days. Follow up H. Pylori testing OP.  2. Anemia. Hg stable. Related to above. Will need OP follow up 3. Hypertension. Controlled. Not taking any anti-hypertensive meds. Monitor OP   Procedures:  EGD 2/23 No gross lesions in proximal/middle esophagus. LA Grade D esophagitis. Biopsied. - 4 cm hiatal hernia. Cameron's erosions without bleeding found within hernia. -  Non-bleeding erosive gastropathy. Multiple non-bleeding gastric ulcers with a clean ulcer base (Forrest Class III). Biopsied for HP. - Erythematous duodenopathy. Multiple non-bleeding duodenal ulcers with a clean ulcer base (Forrest Class III).  - Normal mucosa was found in the second portion of the duodenum.  Consultations:  mansouraty gastroenterology  Discharge Exam: Vitals:   05/17/18 1325 05/17/18 1345  BP: (!) 111/55 113/71  Pulse: 77 76  Resp: 17 16  Temp:    SpO2: 99% 100%    General: awake alert oriented in no acute distress Cardiovascular: rrr no mgr no LE edema Respiratory: normal effort BS clear bilaterally no wheezes Abdomen: flat soft +BS no guarding or rebounding  Discharge Instructions   Discharge Instructions    Call MD for:  persistant dizziness or light-headedness   Complete by:  As directed    Call MD for:  persistant nausea and vomiting   Complete by:  As directed    Diet - low sodium heart healthy   Complete by:  As directed    Discharge instructions   Complete by:  As directed    Full liquid diet for 2 days Take medications as directed Follow up with gastroenterology for post hospitalization 2-3 weeks Office will contact you regarding H pylori results NO NSAID use   Increase activity slowly   Complete by:  As directed      Allergies as of 05/17/2018      Reactions   Other Other (See Comments)   NARCOTICS. 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   Codeine Nausea And Vomiting      Medication List  STOP taking these medications   aspirin-acetaminophen-caffeine 250-250-65 MG tablet Commonly known as:  EXCEDRIN MIGRAINE   calcium carbonate 500 MG chewable tablet Commonly known as:  TUMS - dosed in mg elemental calcium   hydrocortisone 2.5 % rectal cream Commonly known as:  ANUSOL-HC   ibuprofen 200 MG tablet Commonly known as:  ADVIL,MOTRIN     TAKE these medications   magnesium chloride 64 MG Tbec SR  tablet Commonly known as:  SLOW-MAG Take 1 tablet (64 mg total) by mouth daily.   pantoprazole 40 MG tablet Commonly known as:  PROTONIX Take 1 tablet (40 mg total) by mouth 2 (two) times daily for 30 days.      Allergies  Allergen Reactions  . Other Other (See Comments)    NARCOTICS. 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  . Codeine Nausea And Vomiting   Follow-up Information    Waldon Merl, PA-C.   Specialty:  Family Medicine Contact information: [address] [place] [address] [phone]            The results of significant diagnostics from this hospitalization (including imaging, microbiology, ancillary and laboratory) are listed below for reference.    Significant Diagnostic Studies: No results found.  Microbiology: No results found for this or any previous visit (from the past 240 hour(s)).   Labs: Basic Metabolic Panel: Recent Labs  Lab 05/16/18 1140  NA 138  K 3.8  CL 104  CO2 26  GLUCOSE 112*  BUN 19  CREATININE 1.01  CALCIUM 9.3   Liver Function Tests: Recent Labs  Lab 05/16/18 1140  AST 28  ALT 24  ALKPHOS 61  BILITOT 1.9*  PROT 7.3  ALBUMIN 4.3   Recent Labs  Lab 05/16/18 1140  LIPASE 54*   No results for input(s): AMMONIA in the last 168 hours. CBC: Recent Labs  Lab 05/16/18 1140 05/16/18 1820 05/16/18 2338 05/17/18 0540  WBC 9.6  --   --  9.2  HGB 15.2 12.7* 12.2* 12.8*  HCT 44.2 37.6* 36.3* 38.7*  MCV 87.9  --   --  89.0  PLT 286  --   --  250   Cardiac Enzymes: No results for input(s): CKTOTAL, CKMB, CKMBINDEX, TROPONINI in the last 168 hours. BNP: BNP (last 3 results) No results for input(s): BNP in the last 8760 hours.  ProBNP (last 3 results) No results for input(s): PROBNP in the last 8760 hours.  CBG: No results for input(s): GLUCAP in the last 168 hours.     SignedGwenyth Bender NP [facility] 05/17/2018, 3:08 PM

## 2018-05-17 NOTE — Progress Notes (Signed)
EGD completed, tolerated well. Discharge instructions and AVS given to patient, PIV removed. Discharged home with spouse via wheelchair.

## 2018-05-18 ENCOUNTER — Telehealth: Payer: Self-pay

## 2018-05-18 NOTE — Anesthesia Postprocedure Evaluation (Signed)
Anesthesia Post Note  Patient: Trevor Mendez  Procedure(s) Performed: ESOPHAGOGASTRODUODENOSCOPY (EGD) WITH PROPOFOL (N/A ) BIOPSY     Patient location during evaluation: Endoscopy Anesthesia Type: MAC Level of consciousness: awake and alert Pain management: pain level controlled Vital Signs Assessment: post-procedure vital signs reviewed and stable Respiratory status: spontaneous breathing, nonlabored ventilation, respiratory function stable and patient connected to nasal cannula oxygen Cardiovascular status: stable and blood pressure returned to baseline Postop Assessment: no apparent nausea or vomiting Anesthetic complications: no    Last Vitals:  Vitals:   05/17/18 1325 05/17/18 1345  BP: (!) 111/55 113/71  Pulse: 77 76  Resp: 17 16  Temp:    SpO2: 99% 100%    Last Pain:  Vitals:   05/17/18 1345  TempSrc:   PainSc: 0-No pain                 Dewaine Morocho

## 2018-05-18 NOTE — Telephone Encounter (Signed)
Transition Care Management Follow-up Telephone Call  Admit date: 05/16/2018 Discharge date: 05/17/2018 Principal Problem:  Upper GI bleed   How have you been since you were released from the hospital? "I'm okay"   Do you understand why you were in the hospital? yes   Do you understand the discharge instructions? yes   Where were you discharged to? Home.    Items Reviewed:  Medications reviewed: yes  Allergies reviewed: yes  Dietary changes reviewed: yes  Referrals reviewed: yes   Functional Questionnaire:   Activities of Daily Living (ADLs):   He states they are independent in the following: ambulation, bathing and hygiene, feeding, continence, grooming, toileting and dressing States they require assistance with the following: None.    Any transportation issues/concerns?: no   Any patient concerns? no   Confirmed importance and date/time of follow-up visits scheduled yes  Provider Appointment booked with PCP 05/19/2018.   Confirmed with patient if condition begins to worsen call PCP or go to the ER.  Patient was given the office number and encouraged to call back with question or concerns.  : yes

## 2018-05-19 ENCOUNTER — Ambulatory Visit: Payer: 59 | Admitting: Physician Assistant

## 2018-05-19 ENCOUNTER — Other Ambulatory Visit: Payer: Self-pay

## 2018-05-19 ENCOUNTER — Encounter: Payer: Self-pay | Admitting: Physician Assistant

## 2018-05-19 VITALS — BP 118/80 | HR 56 | Temp 98.2°F | Resp 14 | Ht 69.0 in | Wt 146.0 lb

## 2018-05-19 DIAGNOSIS — K269 Duodenal ulcer, unspecified as acute or chronic, without hemorrhage or perforation: Secondary | ICD-10-CM | POA: Diagnosis not present

## 2018-05-19 DIAGNOSIS — K922 Gastrointestinal hemorrhage, unspecified: Secondary | ICD-10-CM | POA: Diagnosis not present

## 2018-05-19 DIAGNOSIS — K250 Acute gastric ulcer with hemorrhage: Secondary | ICD-10-CM | POA: Diagnosis not present

## 2018-05-19 LAB — COMPREHENSIVE METABOLIC PANEL
ALT: 17 U/L (ref 0–53)
AST: 15 U/L (ref 0–37)
Albumin: 4.4 g/dL (ref 3.5–5.2)
Alkaline Phosphatase: 67 U/L (ref 39–117)
BUN: 12 mg/dL (ref 6–23)
CO2: 26 mEq/L (ref 19–32)
Calcium: 9.2 mg/dL (ref 8.4–10.5)
Chloride: 107 mEq/L (ref 96–112)
Creatinine, Ser: 0.77 mg/dL (ref 0.40–1.50)
GFR: 113.95 mL/min (ref >60.00)
Glucose, Bld: 84 mg/dL (ref 70–99)
Potassium: 4.4 mEq/L (ref 3.5–5.1)
SODIUM: 141 meq/L (ref 135–145)
Total Bilirubin: 0.6 mg/dL (ref 0.2–1.2)
Total Protein: 6.7 g/dL (ref 6.0–8.3)

## 2018-05-19 LAB — CBC WITH DIFFERENTIAL/PLATELET
Basophils Absolute: 0 10*3/uL (ref 0.0–0.1)
Basophils Relative: 0.6 % (ref 0.0–3.0)
EOS PCT: 5.1 % — AB (ref 0.0–5.0)
Eosinophils Absolute: 0.3 10*3/uL (ref 0.0–0.7)
HCT: 36.2 % — ABNORMAL LOW (ref 39.0–52.0)
Hemoglobin: 12.6 g/dL — ABNORMAL LOW (ref 13.0–17.0)
LYMPHS ABS: 1.6 10*3/uL (ref 0.7–4.0)
Lymphocytes Relative: 23.2 % (ref 12.0–46.0)
MCHC: 34.8 g/dL (ref 30.0–36.0)
MCV: 88.2 fl (ref 78.0–100.0)
Monocytes Absolute: 0.8 10*3/uL (ref 0.1–1.0)
Monocytes Relative: 11.2 % (ref 3.0–12.0)
NEUTROS PCT: 59.9 % (ref 43.0–77.0)
Neutro Abs: 4 10*3/uL (ref 1.4–7.7)
Platelets: 260 10*3/uL (ref 150.0–400.0)
RBC: 4.1 Mil/uL — ABNORMAL LOW (ref 4.22–5.81)
RDW: 12.9 % (ref 11.5–15.5)
WBC: 6.7 10*3/uL (ref 4.0–10.5)

## 2018-05-19 NOTE — Patient Instructions (Signed)
Please increase OTC Omeprazole to 40 mg twice daily. I am working on getting the Protonix approved for you. Tylenol for aches and pains. Consider topical Icy Hot. I really would recommend swimming to help with joints.  No alcohol consumption. No use of NSAIDs, especially Goody Powders.  Please go to the lab today for blood work.  I will call you with your results. We will alter treatment regimen(s) if indicated by your results.   I would like to see you in 2 weeks.

## 2018-05-20 ENCOUNTER — Encounter: Payer: Self-pay | Admitting: Gastroenterology

## 2018-05-20 ENCOUNTER — Inpatient Hospital Stay: Payer: Self-pay | Admitting: Physician Assistant

## 2018-05-21 ENCOUNTER — Ambulatory Visit: Payer: Self-pay | Admitting: *Deleted

## 2018-05-21 NOTE — Telephone Encounter (Signed)
Summary: hemoglobin result    Pt would like a call to discuss his hemoglobin result he received today. He is concerned due to it being lower than earlier this week.      Call to patient- reviewed dates of labs and he was still in hospital when labs where drawn- patient was unaware he left the hospital with decreased hemoglobin. Advised patient to watch his symptoms- make sure he does follow up with GI- check with them for diet instructions and follow up with his lab work. Patient satisfied with information given and is aware it does take time to build up hemoglobin levels.  Reason for Disposition . [1] Follow-up call from patient regarding patient's clinical status AND [2] information NON-URGENT  Answer Assessment - Initial Assessment Questions 1. REASON FOR CALL or QUESTION: "What is your reason for calling today?" or "How can I best help you?" or "What question do you have that I can help answer?"     Patient has questions about his low hemoglobin reading 2. CALLER: Document the source of call. (e.g., laboratory, patient).     patient  Protocols used: PCP CALL - NO TRIAGE-A-AH

## 2018-05-21 NOTE — Telephone Encounter (Signed)
Spoke with patient. He has not had any recurring blood in stool, and he is feeling some better - he just wanted to make sure there was no cause for concern. He was made aware that there is not, stated verbal understanding.

## 2018-05-21 NOTE — Telephone Encounter (Signed)
I am not concerned. It is actually relatively stable. A .02-0.3 decrease is not significant. It will take some time for this level to return to normal, which is why I wanted to recheck it. That being said, is he having any recurrence of blood in the stool?

## 2018-05-24 ENCOUNTER — Telehealth: Payer: Self-pay | Admitting: Gastroenterology

## 2018-05-24 NOTE — Telephone Encounter (Signed)
PT calling to know the pathology results from his endo procedure that was done on 2-24

## 2018-05-24 NOTE — Telephone Encounter (Signed)
The pt advised that the results were sent on 2/27 via mail and are available via My Chart.   Trevor Mendez [address] [place] [address]  Dear Mr. Trevor Mendez,  I am writing to inform you that the biopsies taken during your recent endoscopic examination showed:   Diagnosis 1. Stomach, biopsy, Gastric - GASTRIC ANTRAL MUCOSA WITH MILD NONSPECIFIC REACTIVE GASTROPATHY - GASTRIC OXYNTIC MUCOSA WITH NO SPECIFIC HISTOPATHOLOGIC CHANGES - WARTHIN STARRY STAIN IS NEGATIVE FOR HELICOBACTER PYLORI 2. Esophagus, biopsy - ESOPHAGEAL SQUAMOUS MUCOSA WITH MILD VASCULAR CONGESTION, AND FOCAL SQUAMOUS BALLOONING, SUGGESTIVE OF REFLUX ESOPHAGITIS - NEGATIVE FOR INCREASED INTRAEPITHELIAL EOSINOPHILS   What does this all mean? Your stomach biopsies show evidence of reactive changes-gastropathy but no evidence of H. pylori infection.  This is good news. Your esophagus biopsies show evidence of significant acid reflux changes.  There is no eosinophilic esophagitis. We need to continue your acid reducing medicines at twice daily dosing until we see you back in clinic and we repeat your upper endoscopy to ensure that there is healing of the esophagus as well as the duodenal changes/ulcers and gastric ulcers.. Follow-up on 06/22/18 at 11 am in clinic. Please minimize any nonsteroidals moving forward.   Please call us at Dept: [phone] if you have persistent problems or have questions about your condition that have not been fully answered at this time.  Sincerely,  Lemar Lofty., MD

## 2018-05-25 NOTE — Progress Notes (Signed)
Patient presents to clinic today for hospital follow-up. Patient presented to ER on 05/16/2018 with c/o epigastric pain, hematemesis and melena. ER workup included labs (Hgb at 15). Patient was started on Protonix drip.  Patient was subsequently admitted to the hospital for further assessment and management. During hospital stay patient underwent EGD revealing gastric ulcers, duodenal ulcers and esophagitis. Biopsies tasking to assess for Barrett's and H. Pylori infection. Pending at discharge.. Patient was discharged home on 05/17/2018 to take Protonix BID, avoid NSAIDS or alcohol and  to follow-up with PCP.  Since discharge, patient endorses doing much better. Denies any vomiting, melena or hematochezia. Is still having some nausea. Notes his insurance would not pay for the Protonix so he has been taking Prilosec OTC 20 mg BID which is helping GERD symptoms. Is hydrating well. Still notes fatigue. Is eating a very bland diet. Is nervous about getting his biopsy results. Denies any new or worsening symptoms. No headaches since hospitalization..   Past Medical History:  Diagnosis Date  . DEPRESSION, MAJOR, SEVERE 09/13/2009   Qualifier: Diagnosis of  By: Linda Hedges MD, [employer]   . Duodenal ulcer   . Gastric ulcer   . Hiatal hernia   . History of alcohol abuse   . HTN (hypertension) 10/09/2011  . MIGRAINE HEADACHE 04/10/2007   Qualifier: Diagnosis of  By: Danny Lawless CMA, [place]      Current Outpatient Medications on File Prior to Visit  Medication Sig Dispense Refill  . omeprazole (PRILOSEC OTC) 20 MG tablet Take 20 mg by mouth 2 (two) times a week.    . magnesium chloride (SLOW-MAG) 64 MG TBEC SR tablet Take 1 tablet (64 mg total) by mouth daily. (Patient not taking: Reported on 05/19/2018) 60 tablet   . pantoprazole (PROTONIX) 40 MG tablet Take 1 tablet (40 mg total) by mouth 2 (two) times daily for 30 days. (Patient not taking: Reported on 05/19/2018) 60 tablet 1   No current facility-administered  medications on file prior to visit.     Allergies  Allergen Reactions  . Other Other (See Comments)    NARCOTICS. 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  . Codeine Nausea And Vomiting    Family History  Problem Relation Age of Onset  . Hypertension Mother   . Hypertension Father   . Alcohol abuse Father   . Alcohol abuse Paternal Aunt   . Alcohol abuse Paternal Uncle   . Alcohol abuse Paternal Grandfather   . Schizophrenia Paternal Grandfather   . Alcohol abuse Paternal Grandmother     Social History   Socioeconomic History  . Marital status: Married    Spouse name: Not on file  . Number of children: 1  . Years of education: 33  . Highest education level: Not on file  Occupational History  . Occupation: Information systems manager: [employer]  Social Needs  . Financial resource strain: Not on file  . Food insecurity:    Worry: Not on file    Inability: Not on file  . Transportation needs:    Medical: Not on file    Non-medical: Not on file  Tobacco Use  . Smoking status: Former Research scientist (life sciences)  . Smokeless tobacco: Current User    Types: Chew  Substance and Sexual Activity  . Alcohol use: No    Comment: recovering alcoholic -- 68 months sober.   . Drug use: No    Frequency: 30.0 times per week    Types: Benzodiazepines  Comment: previous benzo addiction  . Sexual activity: Yes    Partners: Female  Lifestyle  . Physical activity:    Days per week: Not on file    Minutes per session: Not on file  . Stress: Not on file  Relationships  . Social connections:    Talks on phone: Not on file    Gets together: Not on file    Attends religious service: Not on file    Active member of club or organization: Not on file    Attends meetings of clubs or organizations: Not on file    Relationship status: Not on file  Other Topics Concern  . Not on file  Social History Narrative   HS[employer], Hotel manager college. Married -'11(?). 1 dtr - '12. Work - Furniture conservator/restorer.       09/03/2012 AHW Legrand Como was born and grew up in [place], [place]. He has a younger sister. His parents are still together and healthy. He graduated from high school, and has all but a degree at [employer] in Arts development officer. 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 He has been married for 5 years, and has an 43-monthold daughter. He currently lives with his wife and daughter. 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 His social support system consists of his sponsor, wife, mother, father, sister, brother-in-law, and friends. 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 He enjoys golf and firearms. 09/03/2012 AHW    Review of Systems - See HPI.  All other ROS are negative.  BP 118/80   Pulse (!) 56   Temp 98.2 F (36.8 C) (Oral)   Resp 14   Ht '5\' 9"'$  (1.753 m)   Wt 146 lb (66.2 kg)   SpO2 99%   BMI 21.56 kg/m   Physical Exam Vitals signs reviewed.  Constitutional:      Appearance: Normal appearance.  HENT:     Head: Normocephalic and atraumatic.     Right Ear: Tympanic membrane normal.     Left Ear: Tympanic membrane normal.     Mouth/Throat:     Mouth: Mucous membranes are moist.  Eyes:     Conjunctiva/sclera: Conjunctivae normal.     Pupils: Pupils are equal, round, and reactive to light.  Cardiovascular:     Rate and Rhythm: Normal rate.     Pulses: Normal pulses.     Heart sounds: Normal heart sounds.  Pulmonary:     Effort: Pulmonary effort is normal.     Breath sounds: Normal breath sounds.  Abdominal:     General: Abdomen is flat. Bowel sounds are normal. There is no distension.     Palpations: Abdomen is soft. There is no mass.     Tenderness: There is no abdominal tenderness.  Neurological:     Mental Status: He is alert.  Psychiatric:        Mood and Affect: Mood normal.     Recent Results (from the past 2160 hour(s))  Type and screen MChloride    Status: None     Collection Time: 05/16/18 11:24 AM  Result Value Ref Range   ABO/RH(D) B POS    Antibody Screen NEG    Sample Expiration      05/19/2018 Performed at [facility] [address], [place]  [address]  ABO/Rh     Status: None   Collection Time: 05/16/18 11:24 AM  Result Value Ref Range   ABO/RH(D)      B POS Performed at [facility] [address], [place], [place] [address]   CBC     Status: None   Collection Time: 05/16/18 11:40 AM  Result Value Ref Range   WBC 9.6 4.0 - 10.5 K/uL   RBC 5.03 4.22 - 5.81 MIL/uL   Hemoglobin 15.2 13.0 - 17.0 g/dL   HCT 44.2 39.0 - 52.0 %   MCV 87.9 80.0 - 100.0 fL   MCH 30.2 26.0 - 34.0 pg   MCHC 34.4 30.0 - 36.0 g/dL   RDW 12.3 11.5 - 15.5 %   Platelets 286 150 - 400 K/uL   nRBC 0.0 0.0 - 0.2 %    Comment: Performed at [facility], [place] [address] [place], [place] [address]  Comprehensive metabolic panel     Status: Abnormal   Collection Time: 05/16/18 11:40 AM  Result Value Ref Range   Sodium 138 135 - 145 mmol/L   Potassium 3.8 3.5 - 5.1 mmol/L    Comment: HEMOLYSIS AT THIS LEVEL MAY AFFECT RESULT   Chloride 104 98 - 111 mmol/L   CO2 26 22 - 32 mmol/L   Glucose, Bld 112 (H) 70 - 99 mg/dL   BUN 19 6 - 20 mg/dL   Creatinine, Ser 1.01 0.61 - 1.24 mg/dL   Calcium 9.3 8.9 - 10.3 mg/dL   Total Protein 7.3 6.5 - 8.1 g/dL   Albumin 4.3 3.5 - 5.0 g/dL   AST 28 15 - 41 U/L   ALT 24 0 - 44 U/L   Alkaline Phosphatase 61 38 - 126 U/L   Total Bilirubin 1.9 (H) 0.3 - 1.2 mg/dL   GFR calc non Af Amer >60 >60 mL/min   GFR calc Af Amer >60 >60 mL/min   Anion gap 8 5 - 15    Comment: Performed at [facility] [address] [place], [place] [address]  Lipase, blood     Status: Abnormal   Collection Time: 05/16/18 11:40 AM  Result Value Ref Range   Lipase 54 (H) 11 - 51 U/L    Comment: Performed at [facility] [address], [place], [place] [address]  Protime-INR     Status: None   Collection  Time: 05/16/18 11:40 AM  Result Value Ref Range   Prothrombin Time 13.6 11.4 - 15.2 seconds   INR 1.05     Comment: Performed at [facility] [address], [place], [place] [address]  POC occult blood, ED     Status: Abnormal   Collection Time: 05/16/18  2:23 PM  Result Value Ref Range   Fecal Occult Bld POSITIVE (A) NEGATIVE  Hemoglobin and hematocrit, blood     Status: Abnormal   Collection Time: 05/16/18  6:20 PM  Result Value Ref Range   Hemoglobin 12.7 (L) 13.0 - 17.0 g/dL   HCT 37.6 (L) 39.0 - 52.0 %    Comment: Performed at [facility] [address], [place], [place] [address]  Hemoglobin and hematocrit, blood     Status: Abnormal   Collection Time: 05/16/18 11:38 PM  Result Value Ref Range   Hemoglobin 12.2 (L) 13.0 - 17.0 g/dL   HCT 36.3 (L) 39.0 - 52.0 %    Comment: Performed at [facility] [address] [place],  [address]  CBC     Status: Abnormal   Collection  Time: 05/17/18  5:40 AM  Result Value Ref Range   WBC 9.2 4.0 - 10.5 K/uL   RBC 4.35 4.22 - 5.81 MIL/uL   Hemoglobin 12.8 (L) 13.0 - 17.0 g/dL   HCT 38.7 (L) 39.0 - 52.0 %   MCV 89.0 80.0 - 100.0 fL   MCH 29.4 26.0 - 34.0 pg   MCHC 33.1 30.0 - 36.0 g/dL   RDW 12.3 11.5 - 15.5 %   Platelets 250 150 - 400 K/uL   nRBC 0.0 0.0 - 0.2 %    Comment: Performed at [facility], [place] [address] [place], [place] [address]  CBC w/Diff     Status: Abnormal   Collection Time: 05/19/18 12:00 PM  Result Value Ref Range   WBC 6.7 4.0 - 10.5 K/uL   RBC 4.10 (L) 4.22 - 5.81 Mil/uL   Hemoglobin 12.6 (L) 13.0 - 17.0 g/dL   HCT 36.2 (L) 39.0 - 52.0 %   MCV 88.2 78.0 - 100.0 fl   MCHC 34.8 30.0 - 36.0 g/dL   RDW 12.9 11.5 - 15.5 %   Platelets 260.0 150.0 - 400.0 K/uL   Neutrophils Relative % 59.9 43.0 - 77.0 %   Lymphocytes Relative 23.2 12.0 - 46.0 %   Monocytes Relative 11.2 3.0 - 12.0 %   Eosinophils Relative 5.1 (H) 0.0 - 5.0 %   Basophils Relative 0.6 0.0 - 3.0 %   Neutro Abs  4.0 1.4 - 7.7 K/uL   Lymphs Abs 1.6 0.7 - 4.0 K/uL   Monocytes Absolute 0.8 0.1 - 1.0 K/uL   Eosinophils Absolute 0.3 0.0 - 0.7 K/uL   Basophils Absolute 0.0 0.0 - 0.1 K/uL  Comp Met (CMET)     Status: None   Collection Time: 05/19/18 12:00 PM  Result Value Ref Range   Sodium 141 135 - 145 mEq/L   Potassium 4.4 3.5 - 5.1 mEq/L   Chloride 107 96 - 112 mEq/L   CO2 26 19 - 32 mEq/L   Glucose, Bld 84 70 - 99 mg/dL   BUN 12 6 - 23 mg/dL   Creatinine, Ser 0.77 0.40 - 1.50 mg/dL   Total Bilirubin 0.6 0.2 - 1.2 mg/dL   Alkaline Phosphatase 67 39 - 117 U/L   AST 15 0 - 37 U/L   ALT 17 0 - 53 U/L   Total Protein 6.7 6.0 - 8.3 g/dL   Albumin 4.4 3.5 - 5.2 g/dL   Calcium 9.2 8.4 - 10.5 mg/dL   GFR 113.95 >60.00 mL/min    Assessment/Plan: 1. Upper GI bleed Seems to have resolved. Continue BID PPI. Will repeat CBC and CMP. He is to schedule follow-up OP with GI. No NSAIDs, especially Good Powders. Tylenol if needed for any pain. - CBC w/Diff - Comp Met (CMET)  2. Duodenal ulcer 3. Acute gastric ulcer with hemorrhage Taking PPI BID (OTC dose). Will have him increase to 40 mg BID while we work on PA for [employer]. Biopsies negative for Barrett's or H. Pylori infection which is great news. Reassurance given. He is to schedule follow-up with GI. Strict ER precautions reviewed with patient and wife.    Leeanne Rio, PA-C

## 2018-06-02 ENCOUNTER — Ambulatory Visit: Payer: 59 | Admitting: Physician Assistant

## 2018-06-02 ENCOUNTER — Encounter: Payer: Self-pay | Admitting: Physician Assistant

## 2018-06-02 ENCOUNTER — Other Ambulatory Visit: Payer: Self-pay

## 2018-06-02 VITALS — BP 114/62 | HR 66 | Temp 98.2°F | Resp 14 | Ht 69.0 in | Wt 151.0 lb

## 2018-06-02 DIAGNOSIS — K269 Duodenal ulcer, unspecified as acute or chronic, without hemorrhage or perforation: Secondary | ICD-10-CM | POA: Diagnosis not present

## 2018-06-02 DIAGNOSIS — K922 Gastrointestinal hemorrhage, unspecified: Secondary | ICD-10-CM | POA: Diagnosis not present

## 2018-06-02 DIAGNOSIS — K250 Acute gastric ulcer with hemorrhage: Secondary | ICD-10-CM

## 2018-06-02 NOTE — Patient Instructions (Signed)
Please go to the lab today for blood work.  I will call you with your results. We will alter treatment regimen(s) if indicated by your results.   Continue current medication regimen. If anemia has not improved we will expedite your appointment with Gastroenterology.  Start a Peroxyl (made by [employer]) mouthwash daily to see if this helps with the tongue issue. If not, please let me know!!  Hang in there!

## 2018-06-02 NOTE — Progress Notes (Signed)
Patient presents to clinic today for 2nd follow-up after hospitalization for GERD and PUD with multiple non-bleeding gastric/duodenal ulcers. Since last visit patient endorses taking the Protonix twice daily as directed. IS keeping a bland diet and drinking plenty of water. Denies any alcohol consumption, NSAID use or late-night eating. Did note an episode of epigastric pain with eating this past weekend but none since. Denies melena, hematochezia or tenesmus. Notes good urinary and stool output. Denies any fevers or chills. Still noting significant malaise. Denies SOB or palpitations. Is scheduled for follow-up with GI at the end of the month.  Past Medical History:  Diagnosis Date  . DEPRESSION, MAJOR, SEVERE 09/13/2009   Qualifier: Diagnosis of  By: Linda Hedges MD, Heinz Knuckles   . Duodenal ulcer   . Gastric ulcer   . Hiatal hernia   . History of alcohol abuse   . HTN (hypertension) 10/09/2011  . MIGRAINE HEADACHE 04/10/2007   Qualifier: Diagnosis of  By: Danny Lawless CMA, [place]      Current Outpatient Medications on File Prior to Visit  Medication Sig Dispense Refill  . pantoprazole (PROTONIX) 40 MG tablet Take 1 tablet (40 mg total) by mouth 2 (two) times daily for 30 days. 60 tablet 1  . magnesium chloride (SLOW-MAG) 64 MG TBEC SR tablet Take 1 tablet (64 mg total) by mouth daily. (Patient not taking: Reported on 05/19/2018) 60 tablet    No current facility-administered medications on file prior to visit.     Allergies  Allergen Reactions  . Other Other (See Comments)    NARCOTICS. 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  . Codeine Nausea And Vomiting    Family History  Problem Relation Age of Onset  . Hypertension Mother   . Hypertension Father   . Alcohol abuse Father   . Alcohol abuse Paternal Aunt   . Alcohol abuse Paternal Uncle   . Alcohol abuse Paternal Grandfather   . Schizophrenia Paternal Grandfather   . Alcohol abuse Paternal Grandmother     Social  History   Socioeconomic History  . Marital status: Married    Spouse name: Not on file  . Number of children: 1  . Years of education: 51  . Highest education level: Not on file  Occupational History  . Occupation: Information systems manager: [employer]  Social Needs  . Financial resource strain: Not on file  . Food insecurity:    Worry: Not on file    Inability: Not on file  . Transportation needs:    Medical: Not on file    Non-medical: Not on file  Tobacco Use  . Smoking status: Former Research scientist (life sciences)  . Smokeless tobacco: Current User    Types: Chew  Substance and Sexual Activity  . Alcohol use: No    Comment: recovering alcoholic -- 68 months sober.   . Drug use: No    Frequency: 30.0 times per week    Types: Benzodiazepines    Comment: previous benzo addiction  . Sexual activity: Yes    Partners: Female  Lifestyle  . Physical activity:    Days per week: Not on file    Minutes per session: Not on file  . Stress: Not on file  Relationships  . Social connections:    Talks on phone: Not on file    Gets together: Not on file    Attends religious service: Not on file    Active member of club or organization: Not on file  Attends meetings of clubs or organizations: Not on file    Relationship status: Not on file  Other Topics Concern  . Not on file  Social History Narrative   HSG, Hotel [employer] college. Married -'11(?). 1 dtr - '12. Work - Furniture conservator/restorer.      09/03/2012 AHW Legrand Como was born and grew up in [place], [place]. He has a younger sister. His parents are still together and healthy. He graduated from high school, and has all but a degree at [employer] in Arts development officer. 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 He has been married for 5 years, and has an 52-monthold daughter. He currently lives with his wife and daughter. 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 His social support system consists of his  sponsor, wife, mother, father, sister, brother-in-law, and friends. 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 He enjoys golf and firearms. 09/03/2012 AHW   Review of Systems - See HPI.  All other ROS are negative.  BP 114/62   Pulse 66   Temp 98.2 F (36.8 C) (Oral)   Resp 14   Ht '5\' 9"'$  (1.753 m)   Wt 151 lb (68.5 kg)   SpO2 98%   BMI 22.30 kg/m   Physical Exam Vitals signs reviewed.  Constitutional:      Appearance: Normal appearance.  HENT:     Head: Normocephalic and atraumatic.     Mouth/Throat:     Pharynx: Oropharynx is clear.  Eyes:     Conjunctiva/sclera: Conjunctivae normal.  Neck:     Musculoskeletal: Neck supple.  Cardiovascular:     Rate and Rhythm: Normal rate and regular rhythm.     Pulses: Normal pulses.     Heart sounds: Normal heart sounds.  Pulmonary:     Effort: Pulmonary effort is normal.     Breath sounds: Normal breath sounds.  Abdominal:     General: Bowel sounds are normal. There is no distension.     Palpations: Abdomen is soft.     Tenderness: There is no abdominal tenderness.  Neurological:     General: No focal deficit present.     Mental Status: He is alert and oriented to person, place, and time.  Psychiatric:        Mood and Affect: Mood normal.     Recent Results (from the past 2160 hour(s))  Type and screen MRosebush    Status: None   Collection Time: 05/16/18 11:24 AM  Result Value Ref Range   ABO/RH(D) B POS    Antibody Screen NEG    Sample Expiration      05/19/2018 Performed at [facility] [address] [place]  [id number]  ABO/Rh     Status: None   Collection Time: 05/16/18 11:24 AM  Result Value Ref Range   ABO/RH(D)      B POS Performed at [facility] [id number]  CBC     Status: None   Collection Time: 05/16/18 11:40 AM  Result Value Ref Range   WBC 9.6 4.0 - 10.5 K/uL   RBC 5.03 4.22 - 5.81 MIL/uL   Hemoglobin  15.2 13.0 - 17.0 g/dL   HCT 44.2 39.0 - 52.0 %   MCV 87.9 80.0 - 100.0 fL   MCH 30.2 26.0 - 34.0 pg   MCHC 34.4 30.0 - 36.0 g/dL  RDW 12.3 11.5 - 15.5 %   Platelets 286 150 - 400 K/uL   nRBC 0.0 0.0 - 0.2 %    Comment: Performed at [facility], [place] [address] [place], [place] [address]  Comprehensive metabolic panel     Status: Abnormal   Collection Time: 05/16/18 11:40 AM  Result Value Ref Range   Sodium 138 135 - 145 mmol/L   Potassium 3.8 3.5 - 5.1 mmol/L    Comment: HEMOLYSIS AT THIS LEVEL MAY AFFECT RESULT   Chloride 104 98 - 111 mmol/L   CO2 26 22 - 32 mmol/L   Glucose, Bld 112 (H) 70 - 99 mg/dL   BUN 19 6 - 20 mg/dL   Creatinine, Ser 1.01 0.61 - 1.24 mg/dL   Calcium 9.3 8.9 - 10.3 mg/dL   Total Protein 7.3 6.5 - 8.1 g/dL   Albumin 4.3 3.5 - 5.0 g/dL   AST 28 15 - 41 U/L   ALT 24 0 - 44 U/L   Alkaline Phosphatase 61 38 - 126 U/L   Total Bilirubin 1.9 (H) 0.3 - 1.2 mg/dL   GFR calc non Af Amer >60 >60 mL/min   GFR calc Af Amer >60 >60 mL/min   Anion gap 8 5 - 15    Comment: Performed at [facility] [address] [place], [place] [address]  Lipase, blood     Status: Abnormal   Collection Time: 05/16/18 11:40 AM  Result Value Ref Range   Lipase 54 (H) 11 - 51 U/L    Comment: Performed at [facility] [address] [place], [place] [address]  Protime-INR     Status: None   Collection Time: 05/16/18 11:40 AM  Result Value Ref Range   Prothrombin Time 13.6 11.4 - 15.2 seconds   INR 1.05     Comment: Performed at [facility] [address] [place], [place] [address]  POC occult blood, ED     Status: Abnormal   Collection Time: 05/16/18  2:23 PM  Result Value Ref Range   Fecal Occult Bld POSITIVE (A) NEGATIVE  Hemoglobin and hematocrit, blood     Status: Abnormal   Collection Time: 05/16/18  6:20 PM  Result Value Ref Range   Hemoglobin 12.7 (L) 13.0 - 17.0 g/dL   HCT 37.6 (L) 39.0 - 52.0 %    Comment: Performed at [facility] [address] [place], [place] [address]  Hemoglobin and hematocrit, blood     Status: Abnormal   Collection Time: 05/16/18 11:38 PM  Result Value Ref Range   Hemoglobin 12.2 (L) 13.0 - 17.0 g/dL   HCT 36.3 (L) 39.0 - 52.0 %    Comment: Performed at [facility] [address], [place], [place] [address]  CBC     Status: Abnormal   Collection Time: 05/17/18  5:40 AM  Result Value Ref Range   WBC 9.2 4.0 - 10.5 K/uL   RBC 4.35 4.22 - 5.81 MIL/uL   Hemoglobin 12.8 (L) 13.0 - 17.0 g/dL   HCT 38.7 (L) 39.0 - 52.0 %   MCV 89.0 80.0 - 100.0 fL   MCH 29.4 26.0 - 34.0 pg   MCHC 33.1 30.0 - 36.0 g/dL   RDW 12.3 11.5 - 15.5 %   Platelets 250 150 - 400 K/uL   nRBC 0.0 0.0 - 0.2 %    Comment: Performed at [facility], [place] [address],  [place],  [address]  CBC w/Diff     Status: Abnormal   Collection Time: 05/19/18 12:00 PM  Result Value Ref Range   WBC 6.7 4.0 - 10.5 K/uL   RBC 4.10 (L) 4.22 - 5.81 Mil/uL   Hemoglobin 12.6 (L) 13.0 - 17.0 g/dL   HCT 36.2 (L) 39.0 - 52.0 %   MCV 88.2 78.0 - 100.0 fl   MCHC 34.8 30.0 - 36.0 g/dL   RDW 12.9 11.5 - 15.5 %   Platelets 260.0 150.0 - 400.0 K/uL   Neutrophils Relative % 59.9 43.0 - 77.0 %   Lymphocytes Relative 23.2 12.0 - 46.0 %   Monocytes Relative 11.2 3.0 - 12.0 %   Eosinophils Relative 5.1 (H) 0.0 - 5.0 %   Basophils Relative 0.6 0.0 - 3.0 %   Neutro Abs 4.0 1.4 - 7.7 K/uL   Lymphs Abs 1.6 0.7 - 4.0 K/uL   Monocytes Absolute 0.8 0.1 - 1.0 K/uL   Eosinophils Absolute 0.3 0.0 - 0.7 K/uL   Basophils Absolute 0.0 0.0 - 0.1 K/uL  Comp Met (CMET)     Status: None   Collection Time: 05/19/18 12:00 PM  Result Value Ref Range   Sodium 141 135 - 145 mEq/L   Potassium 4.4 3.5 - 5.1 mEq/L   Chloride 107 96 - 112 mEq/L   CO2 26 19 - 32 mEq/L   Glucose, Bld 84 70 - 99 mg/dL   BUN 12 6 - 23 mg/dL   Creatinine, Ser 0.77 0.40 - 1.50 mg/dL   Total Bilirubin 0.6 0.2 - 1.2 mg/dL   Alkaline Phosphatase 67 39 - 117 U/L    AST 15 0 - 37 U/L   ALT 17 0 - 53 U/L   Total Protein 6.7 6.0 - 8.3 g/dL   Albumin 4.4 3.5 - 5.2 g/dL   Calcium 9.2 8.4 - 10.5 mg/dL   GFR 113.95 >60.00 mL/min    Assessment/Plan: 1. Duodenal ulcer 2. Acute gastric ulcer with hemorrhage 3. Upper GI bleed Continue PPI BID and bland diet. Continue avoidance of any NSAID use or alcohol. Continue limiting caffeine intake. Supportive measures reviewed. Giving ongoing fatigue, want to check his hgb to ensure it is still rising and that there is no concern for recurrent bleed despite absence of noted melena. If not improving, will expedite GI appointment.Leeanne Rio, PA-C

## 2018-06-03 ENCOUNTER — Telehealth: Payer: Self-pay | Admitting: Gastroenterology

## 2018-06-03 LAB — CBC WITH DIFFERENTIAL/PLATELET
Basophils Absolute: 0.1 10*3/uL (ref 0.0–0.1)
Basophils Relative: 0.7 % (ref 0.0–3.0)
EOS PCT: 3.2 % (ref 0.0–5.0)
Eosinophils Absolute: 0.3 10*3/uL (ref 0.0–0.7)
HCT: 35.1 % — ABNORMAL LOW (ref 39.0–52.0)
HEMOGLOBIN: 11.9 g/dL — AB (ref 13.0–17.0)
Lymphocytes Relative: 22.3 % (ref 12.0–46.0)
Lymphs Abs: 1.8 10*3/uL (ref 0.7–4.0)
MCHC: 34 g/dL (ref 30.0–36.0)
MCV: 86.3 fl (ref 78.0–100.0)
Monocytes Absolute: 0.8 10*3/uL (ref 0.1–1.0)
Monocytes Relative: 9.4 % (ref 3.0–12.0)
Neutro Abs: 5.2 10*3/uL (ref 1.4–7.7)
Neutrophils Relative %: 64.4 % (ref 43.0–77.0)
Platelets: 335 10*3/uL (ref 150.0–400.0)
RBC: 4.07 Mil/uL — AB (ref 4.22–5.81)
RDW: 13.8 % (ref 11.5–15.5)
WBC: 8.1 10*3/uL (ref 4.0–10.5)

## 2018-06-03 LAB — COMPREHENSIVE METABOLIC PANEL
ALT: 18 U/L (ref 0–53)
AST: 17 U/L (ref 0–37)
Albumin: 4.5 g/dL (ref 3.5–5.2)
Alkaline Phosphatase: 73 U/L (ref 39–117)
BUN: 17 mg/dL (ref 6–23)
CO2: 27 mEq/L (ref 19–32)
CREATININE: 0.9 mg/dL (ref 0.40–1.50)
Calcium: 9.6 mg/dL (ref 8.4–10.5)
Chloride: 103 mEq/L (ref 96–112)
GFR: 95.16 mL/min (ref >60.00)
Glucose, Bld: 87 mg/dL (ref 70–99)
Potassium: 4.1 mEq/L (ref 3.5–5.1)
Sodium: 139 mEq/L (ref 135–145)
TOTAL PROTEIN: 6.6 g/dL (ref 6.0–8.3)
Total Bilirubin: 1.1 mg/dL (ref 0.2–1.2)

## 2018-06-03 NOTE — Telephone Encounter (Signed)
Trevor Mendez was advised and will notify the pt of 3/18 1:45 pm appt.

## 2018-06-03 NOTE — Telephone Encounter (Signed)
Marylene Land from [employer] called said that the patient anemia is worsening and has a GI bleed the provider Rody Daphine Deutscher asked if we can see him sooner. He is scheduled for 06-22-18  Marylene Land [phone]

## 2018-06-04 ENCOUNTER — Ambulatory Visit: Payer: Self-pay | Admitting: Physician Assistant

## 2018-06-08 ENCOUNTER — Telehealth: Payer: Self-pay

## 2018-06-08 NOTE — Telephone Encounter (Signed)
Covid-19 travel screening questions  Have you traveled in the last 14 days? NO If yes where?  Do you now or have you had a fever in the last 14 days? NO  Do you have any respiratory symptoms of shortness of breath or cough now or in the last 14 days? NO   Do you have any family members or close contacts with diagnosed or suspected Covid-19? NO       

## 2018-06-09 ENCOUNTER — Ambulatory Visit: Payer: 59 | Admitting: Physician Assistant

## 2018-06-09 ENCOUNTER — Other Ambulatory Visit: Payer: Self-pay

## 2018-06-09 ENCOUNTER — Other Ambulatory Visit (INDEPENDENT_AMBULATORY_CARE_PROVIDER_SITE_OTHER): Payer: 59

## 2018-06-09 ENCOUNTER — Encounter: Payer: Self-pay | Admitting: Physician Assistant

## 2018-06-09 VITALS — BP 118/70 | HR 62 | Temp 98.3°F | Ht 69.0 in | Wt 153.0 lb

## 2018-06-09 DIAGNOSIS — K269 Duodenal ulcer, unspecified as acute or chronic, without hemorrhage or perforation: Secondary | ICD-10-CM | POA: Diagnosis not present

## 2018-06-09 DIAGNOSIS — K254 Chronic or unspecified gastric ulcer with hemorrhage: Secondary | ICD-10-CM

## 2018-06-09 DIAGNOSIS — D500 Iron deficiency anemia secondary to blood loss (chronic): Secondary | ICD-10-CM | POA: Diagnosis not present

## 2018-06-09 LAB — HEMOGLOBIN: HEMOGLOBIN: 12.3 g/dL — AB (ref 13.0–17.0)

## 2018-06-09 NOTE — Progress Notes (Signed)
Chief Complaint: GI bleed, anemia  HPI:    Trevor Mendez is a 38 year old Caucasian male with a past medical history as listed below, known to Trevor Mendez from recent hospitalization, who presents clinic today with a complaint of GI bleed and anemia.    05/17/2018 patient was consulted by our service in the hospital for melena and hematemesis in the setting of NSAIDs.  Also an acute blood loss anemia with a hemoglobin 12.8 (down 3 g from baseline of 15.2).  Patient had an EGD that day with Trevor Mendez with no gross lesions in the proximal/mid esophagus, LA grade D esophagitis, 4 cm hiatal hernia with Trevor Mendez erosions without bleeding within the hernia, nonbleeding erosive gastropathy with multiple nonbleeding gastric ulcers with a clean ulcer base, erythematous to adenopathy with multiple nonbleeding duodenal ulcers with a clean ulcer base and normal mucosa in the second portion of duodenum.  Pathology showed reactive gastropathy and esophagitis.  Repeat EGD was recommended in 2 months to check on healing and patient was given a twice daily PPI.    06/02/2018 hemoglobin 11.9 (12.6 on 05/19/2018).  Normal CMP.    Today, the patient presents clinic accompanied by his girlfriend and explains that he has been feeling tired overall, ever since this all happened.  Tells me that he will do a project and then have to take a nap and takes it easy on the weekends.  Denies any further heartburn, reflux, nausea, vomiting or epigastric pain but interestingly does have occasional right lower quadrant pain which is mostly at night.  This is a "passing pain" that is resolved after 15 to 20 minutes.  Denies change in bowel habits.  Continues Pantoprazole 40 mg twice daily.  Has avoided NSAIDs.  He has slowly started gaining weight after his initial 14 pound weight loss prior to procedure.    Social history positive for his girlfriend keeping him on track with an "anti-inflammatory diet".  Patient tells me he has stopped  eating red meat and many other things that he likes, is also now only drinking 2 Sprites a day as opposed to [facility] that he was before.    Denies fever, chills, weight loss, anorexia or symptoms that awaken him from sleep.  Past Medical History:  Diagnosis Date  . DEPRESSION, MAJOR, SEVERE 09/13/2009   Qualifier: Diagnosis of  By: Trevor Mendez, Trevor Mendez   . Duodenal ulcer   . Gastric ulcer   . Hiatal hernia   . History of alcohol abuse   . HTN (hypertension) 10/09/2011  . MIGRAINE HEADACHE 04/10/2007   Qualifier: Diagnosis of  By: Trevor Mendez      Past Surgical History:  Procedure Laterality Date  . BIOPSY  05/17/2018   Procedure: BIOPSY;  Surgeon: Trevor Mendez Trevor Mendez., Mendez;  [facility] ENDOSCOPY;  Service: Gastroenterology;;  . ESOPHAGOGASTRODUODENOSCOPY (EGD) WITH PROPOFOL N/A 05/17/2018   Procedure: ESOPHAGOGASTRODUODENOSCOPY (EGD) WITH PROPOFOL;  Surgeon: Trevor Mendez., Mendez;  Location: [facility] ENDOSCOPY;  Service: Gastroenterology;  Laterality: N/A;  . finger surgury    . ORIF ANKLE FRACTURE Right   . WISDOM TOOTH EXTRACTION      Current Outpatient Medications  Medication Sig Dispense Refill  . magnesium chloride (SLOW-MAG) 64 MG TBEC SR tablet Take 1 tablet (64 mg total) by mouth daily. (Patient not taking: Reported on 05/19/2018) 60 tablet   . pantoprazole (PROTONIX) 40 MG tablet Take 1 tablet (40 mg total) by mouth 2 (two) times daily for 30 days. 60 tablet  1   No current facility-administered medications for this visit.     Allergies as of 06/09/2018 - Review Complete 06/02/2018  Allergen Reaction Noted  . Other Other (See Comments) 05/16/2018  . Codeine Nausea And Vomiting 10/09/2011    Family History  Problem Relation Age of Onset  . Hypertension Mother   . Hypertension Father   . Alcohol abuse Father   . Alcohol abuse Paternal Aunt   . Alcohol abuse Paternal Uncle   . Alcohol abuse Paternal Grandfather   . Schizophrenia Paternal  Grandfather   . Alcohol abuse Paternal Grandmother     Social History   Socioeconomic History  . Marital status: Married    Spouse name: Not on file  . Number of children: 1  . Years of education: 3314  . Highest education level: Not on file  Occupational History  . Occupation: Chiropodistmachinist    Employer: [employer]  Social Needs  . Financial resource strain: Not on file  . Food insecurity:    Worry: Not on file    Inability: Not on file  . Transportation needs:    Medical: Not on file    Non-medical: Not on file  Tobacco Use  . Smoking status: Former Games developermoker  . Smokeless tobacco: Current User    Types: Chew  Substance and Sexual Activity  . Alcohol use: No    Comment: recovering alcoholic -- 68 months sober.   . Drug use: No    Frequency: 30.0 times per week    Types: Benzodiazepines    Comment: previous benzo addiction  . Sexual activity: Yes    Partners: Female  Lifestyle  . Physical activity:    Days per week: Not on file    Minutes per session: Not on file  . Stress: Not on file  Relationships  . Social connections:    Talks on phone: Not on file    Gets together: Not on file    Attends religious service: Not on file    Active member of club or organization: Not on file    Attends meetings of clubs or organizations: Not on file    Relationship status: Not on file  . Intimate partner violence:    Fear of current or ex partner: Not on file    Emotionally abused: Not on file    Physically abused: Not on file    Forced sexual activity: Not on file  Other Topics Concern  . 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 Married -'11(?). 1 dtr - '12. Work - Chartered certified accountantmachinist.      09/03/2012 AHW Trevor NeedleMichael was born [place]. He has a younger sister. His parents are still together and healthy. He graduated from high school, and has all but a degree at [employer] in Musicianmachining. 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 He has been married for 5 years, and has an 4664-month-old daughter. He currently lives with his wife and daughter. 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 His social support system consists of his sponsor, wife, mother, father, sister, brother-in-law, and friends. 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 He denies any legal problems. He enjoys golf and firearms. 09/03/2012 AHW    Review of Systems:    Constitutional: No weight loss, fever or chills Cardiovascular: No chest pain Respiratory: No SOB  Gastrointestinal: See HPI and otherwise negative   Physical Exam:  Vital signs: BP 118/70   Pulse 62   Temp 98.3 F (36.8 C)   Ht 5\' 9"  (1.753 m)   Wt 153 lb (69.4 kg)   BMI 22.59 kg/m   Constitutional:   Pleasant Caucasian male appears to be in NAD, Well developed, Well nourished, alert and cooperative Respiratory: Respirations even and unlabored. Lungs clear to auscultation bilaterally.   No wheezes, crackles, or rhonchi.  Cardiovascular: Normal S1, S2. No MRG. Regular rate and rhythm. No peripheral edema, cyanosis or pallor.  Gastrointestinal:  Soft, nondistended, Mild RLQ ttp, Mild Epigastric ttp. No rebound or guarding. Normal bowel sounds. No appreciable masses or hepatomegaly. Rectal:  Not performed.  Psychiatric: Demonstrates good judgement and reason without abnormal affect or behaviors.  MOST RECENT LABS AND IMAGING: CBC    Component Value Date/Time   WBC 8.1 06/02/2018 1529   RBC 4.07 (L) 06/02/2018 1529   HGB 11.9 (L) 06/02/2018 1529   HCT 35.1 (L) 06/02/2018 1529   PLT 335.0 06/02/2018 1529   MCV 86.3 06/02/2018 1529   MCH 29.4 05/17/2018 0540   MCHC 34.0 06/02/2018 1529   RDW 13.8 06/02/2018 1529   LYMPHSABS 1.8 06/02/2018 1529   MONOABS 0.8 06/02/2018 1529   EOSABS 0.3 06/02/2018 1529   BASOSABS 0.1 06/02/2018 1529    CMP     Component Value Date/Time   NA 139 06/02/2018 1529   K 4.1 06/02/2018 1529   CL 103  06/02/2018 1529   CO2 27 06/02/2018 1529   GLUCOSE 87 06/02/2018 1529   BUN 17 06/02/2018 1529   CREATININE 0.90 06/02/2018 1529   CREATININE 1.00 08/19/2017 1644   CALCIUM 9.6 06/02/2018 1529   PROT 6.6 06/02/2018 1529   ALBUMIN 4.5 06/02/2018 1529   AST 17 06/02/2018 1529   ALT 18 06/02/2018 1529   ALKPHOS 73 06/02/2018 1529   BILITOT 1.1 06/02/2018 1529   GFRNONAA >60 05/16/2018 1140   GFRNONAA 97 08/19/2017 1644   GFRAA >60 05/16/2018 1140   GFRAA 113 08/19/2017 1644    Assessment: 1.  Gastric ulcer with hemorrhage: Due to NSAID use, no hgb dropping again 2.  Duodenal ulcer 3.  GI bleed with acute blood loss anemia: Due to the above, discovered 05/17/2018 via EGD during hospitalization  Plan: 1.  Patient's hemoglobin did drop about a point since hospitalization, he has seen no further melena but does continue with some mild abdominal pain.  Has avoided all NSAIDs. 2.  Continue Pantoprazole 40mg  PO twice daily, 30-60 minutes before breakfast and dinner. 3.  We will go ahead and schedule patient's repeat EGD with Trevor Mendez within the next few weeks to reevaluate. 4.  Ordered repeat hemoglobin today.  If this has continued to drop or has dropped drastically then will need to consider urgent/emergent EGD.  If continues to drop slowly would recommend iron supplementation daily. 5.  Patient to follow in clinic per recommendations from Trevor Mendez after time of procedure.  Trevor Meeker, Trevor Mendez [facility] 06/09/2018, 1:42 PM  Cc: Waldon Merl, Trevor Mendez

## 2018-06-09 NOTE — Patient Instructions (Addendum)
If you are age 37 or older, your body mass index should be between 23-30. Your Body mass index is 22.59 kg/m. If this is out of the aforementioned range listed, please consider follow up with your Primary Care Provider.  If you are age 11 or younger, your body mass index should be between 19-25. Your Body mass index is 22.59 kg/m. If this is out of the aformentioned range listed, please consider follow up with your Primary Care Provider.   You have been scheduled for an endoscopy. Please follow written instructions given to you at your visit today. If you use inhalers (even only as needed), please bring them with you on the day of your procedure. Your physician has requested that you go to www.startemmi.com and enter the access code given to you at your visit today. This web site gives a general overview about your procedure. However, you should still follow specific instructions given to you by our office regarding your preparation for the procedure.  Your provider has requested that you go to the basement level for lab work before leaving today. Press "B" on the elevator. The lab is located at the first door on the left as you exit the elevator. Hgb  Continue Pantoprazole 40 mg twice daily.  You have been given GERD handout.  Thank you for choosing me and [facility].    Hyacinth Meeker, PA-C  To help prevent the possible spread of infection to our patients, communities, and staff; we will be implementing the following measures:  Please only allow one visitor/family member to accompany you to any upcoming appointments with [facility]. If you have any concerns about this please contact our office to discuss prior to the appointment.

## 2018-06-10 NOTE — Progress Notes (Signed)
Agree with assessment and plan per PA Lemmon. Thank you for seeing him back in follow-up. Hemoglobin reviewed and stable. Reasonable to proceed with follow-up endoscopy at scheduled time. The patient may benefit from an abdominal ultrasound nonurgently if patient continues to have issues and will be considered after his follow-up endoscopy.

## 2018-06-17 ENCOUNTER — Telehealth: Payer: Self-pay

## 2018-06-17 NOTE — Telephone Encounter (Signed)
Covid-19 travel screening questions  Have you traveled in the last 14 days? No. If yes where?  Do you now or have you had a fever in the last 14 days? No.  Do you have any respiratory symptoms of shortness of breath or cough now or in the last 14 days? No.   Do you have a medical history of Congestive Heart Failure?  Do you have a medical history of lung disease?  Do you have any family members or close contacts with diagnosed or suspected Covid-19? No.      Talked to Patient and went over our new "NO care partner in the lobby" policy and went over our  screening questions. He agreed and understood and had no further questions.

## 2018-06-18 ENCOUNTER — Ambulatory Visit (AMBULATORY_SURGERY_CENTER): Payer: 59 | Admitting: Gastroenterology

## 2018-06-18 ENCOUNTER — Other Ambulatory Visit: Payer: Self-pay

## 2018-06-18 ENCOUNTER — Encounter: Payer: Self-pay | Admitting: Gastroenterology

## 2018-06-18 VITALS — BP 98/50 | HR 64 | Temp 97.7°F | Resp 12 | Ht 69.0 in | Wt 153.0 lb

## 2018-06-18 DIAGNOSIS — K210 Gastro-esophageal reflux disease with esophagitis: Secondary | ICD-10-CM | POA: Diagnosis not present

## 2018-06-18 DIAGNOSIS — K269 Duodenal ulcer, unspecified as acute or chronic, without hemorrhage or perforation: Secondary | ICD-10-CM

## 2018-06-18 DIAGNOSIS — K2980 Duodenitis without bleeding: Secondary | ICD-10-CM | POA: Diagnosis present

## 2018-06-18 DIAGNOSIS — K449 Diaphragmatic hernia without obstruction or gangrene: Secondary | ICD-10-CM | POA: Diagnosis not present

## 2018-06-18 DIAGNOSIS — K253 Acute gastric ulcer without hemorrhage or perforation: Secondary | ICD-10-CM | POA: Diagnosis not present

## 2018-06-18 DIAGNOSIS — K2970 Gastritis, unspecified, without bleeding: Secondary | ICD-10-CM

## 2018-06-18 DIAGNOSIS — K254 Chronic or unspecified gastric ulcer with hemorrhage: Secondary | ICD-10-CM

## 2018-06-18 MED ORDER — FERROUS GLUCONATE 324 (38 FE) MG PO TABS: 324.0000 mg | ORAL_TABLET | Freq: Every day | ORAL | 3 refills | Status: DC

## 2018-06-18 MED ORDER — SUCRALFATE 1 GM/10ML PO SUSP: 1.0000 g | Freq: Two times a day (BID) | ORAL | 0 refills | Status: DC

## 2018-06-18 MED ORDER — SODIUM CHLORIDE 0.9 % IV SOLN: 500.0000 mL | Freq: Once | INTRAVENOUS | Status: DC

## 2018-06-18 NOTE — Progress Notes (Signed)
To PACU, VSS. Report to [email or web address] 

## 2018-06-18 NOTE — Progress Notes (Signed)
Called to room to assist during endoscopic procedure.  Patient ID and intended procedure confirmed with present staff. Received instructions for my participation in the procedure from the performing physician.  

## 2018-06-18 NOTE — Op Note (Signed)
[place] Patient Name: Trevor Mendez Procedure Date: 06/18/2018 10:51 AM MRN: [id number] Endoscopist: Justice Britain , MD Age: 38 Referring MD:  Date of Birth: 07/02/1980 Gender: Male Account #: [id number] Procedure:                Upper GI endoscopy Indications:              Generalized abdominal pain, Follow-up of gastric                            ulcer, Follow-up of duodenal ulcer, Esophagitis,                            Follow-up of esophagitis Medicines:                Monitored Anesthesia Care Procedure:                Pre-Anesthesia Assessment:                           - Prior to the procedure, a History and Physical                            was performed, and patient medications and                            allergies were reviewed. The patient's tolerance of                            previous anesthesia was also reviewed. The risks                            and benefits of the procedure and the sedation                            options and risks were discussed with the patient.                            All questions were answered, and informed consent                            was obtained. Prior Anticoagulants: The patient has                            taken no previous anticoagulant or antiplatelet                            agents. ASA Grade Assessment: II - A patient with                            mild systemic disease. After reviewing the risks                            and benefits, the patient was deemed in  satisfactory condition to undergo the procedure.                           After obtaining informed consent, the endoscope was                            passed under direct vision. Throughout the                            procedure, the patient's blood pressure, pulse, and                            oxygen saturations were monitored continuously. The                            Model GIF-HQ190 [id number])  scope was introduced                            through the mouth, and advanced to the second part                            of duodenum. The upper GI endoscopy was                            accomplished without difficulty. The patient                            tolerated the procedure. Scope In: Scope Out: Findings:                 No gross lesions were noted in the proximal                            esophagus and in the mid esophagus.                           LA Grade A (one or more mucosal breaks less than 5                            mm, not extending between tops of 2 mucosal folds)                            esophagitis with no bleeding was found in the                            distal esophagus towards the GE Junction. This is                            healing from prior evaluation.                           A medium-sized hiatal hernia was found. The  proximal extent of the gastric folds (end of                            tubular esophagus) was 36 cm from the incisors. The                            hiatal narrowing was 40 cm from the incisors. The                            Z-line was 35 cm from the incisors.                           Biopsies were taken with a cold forceps in the                            entire esophagus for histology and rule out EoE.                           Two non-bleeding superficial gastric ulcers with a                            clean ulcer base (Forrest Class III) were found in                            the gastric antrum and in the prepyloric region of                            the stomach. Healing from prior endoscopy.                           A medium healed ulcer scar was found in the                            prepyloric region of the stomach.                           Patchy mildly erythematous mucosa was found in the                            gastric body and in the gastric antrum. Biopsies                             were taken with a cold forceps for histology and                            Helicobacter pylori testing.                           Two non-bleeding cratered duodenal ulcers with a                            clean ulcer base (Forrest Class III) were found in  the duodenal bulb. The largest lesion was 8 mm in                            largest dimension. Biopsies were taken with a cold                            forceps for histology. These are healing from prior                            but still present.                           No other gross lesions were noted in the duodenal                            bulb, in the D1/D2 sweep and in the second portion                            of the duodenum. Biopsies were taken with a cold                            forceps for histology to rule out Celiac                            disease/enteropathy. Complications:            No immediate complications. Estimated Blood Loss:     Estimated blood loss was minimal. Impression:               - No gross lesions in proximal/middle esophagus.                           - LA Grade A reflux esophagitis at distal esophagus                            into GE Junction..                           - Biopsies were taken with a cold forceps for                            histology in the entire esophagus for EoE.                           - Medium-sized hiatal hernia.                           - Non-bleeding gastric ulcers with a clean ulcer                            base (Forrest Class III) - improved from prior.                           - Scar in the prepyloric region of the stomach from  healing ulcer.                           - Erythematous mucosa in the gastric body and                            antrum. Biopsied for HP.                           - Non-bleeding duodenal ulcers with a clean ulcer                            base (Forrest Class  III). Biopsied for HP.                           - No gross lesions in the duodenal bulb, in the                            first portion of the duodenum and in the second                            portion of the duodenum. Biopsied for                            enteropathy/Celiac. Recommendation:           - The patient will be observed post-procedure,                            until all discharge criteria are met.                           - Discharge patient to home.                           - Patient has a contact number available for                            emergencies. The signs and symptoms of potential                            delayed complications were discussed with the                            patient. Return to normal activities tomorrow.                            Written discharge instructions were provided to the                            patient.                           - Continue present medications.                           -  Follow up pathology.                           - Start Sucralfate 1 g BID (make sure to not take                            any medication 1 hour prior or 1 hour after taking                            this medication to ensure absorption). Will give                            this medication for next 8-weeks.                           - Start Iron (ferrous gluconate 324 mg daily).                            Reminded that this could cause stools to become                            darker in quality.                           - Repeat CBC in 2-weeks with Iron/TIBC/Ferritin as                            well.                           - Repeat upper endoscopy in 2 months to check                            healing.                           - Schedule an Abdominal Ultrasound for 3-4 weeks                            (due to issues surrounding COVID-19).                           - Dependent on how patient is doing in the next                             4-weeks, will need a follow up in clinic to                            determine the need of any additional                            workup/management.                           - The findings and recommendations were discussed  with the patient. Justice Britain, MD 06/18/2018 11:37:39 AM

## 2018-06-18 NOTE — Patient Instructions (Addendum)
Start Carafate and Iron (Rx called in to [facility] on [address]) Repeat CBC and Iron/TIBC/Ferritin in 2 Weeks. Repeat Upper endoscopy in 2 months Schedule Abdominal Ultrasound in 3-4 weeks.  YOU HAD AN ENDOSCOPIC PROCEDURE TODAY AT THE [facility]:   Refer to the procedure report that was given to you for any specific questions about what was found during the examination.  If the procedure report does not answer your questions, please call your gastroenterologist to clarify.  If you requested that your care partner not be given the details of your procedure findings, then the procedure report has been included in a sealed envelope for you to review at your convenience later.  YOU SHOULD EXPECT: Some feelings of bloating in the abdomen. Passage of more gas than usual.  Walking can help get rid of the air that was put into your GI tract during the procedure and reduce the bloating. If you had a lower endoscopy (such as a colonoscopy or flexible sigmoidoscopy) you may notice spotting of blood in your stool or on the toilet paper. If you underwent a bowel prep for your procedure, you may not have a normal bowel movement for a few days.  Please Note:  You might notice some irritation and congestion in your nose or some drainage.  This is from the oxygen used during your procedure.  There is no need for concern and it should clear up in a day or so.  SYMPTOMS TO REPORT IMMEDIATELY:   Following upper endoscopy (EGD)  Vomiting of blood or coffee ground material  New chest pain or pain under the shoulder blades  Painful or persistently difficult swallowing  New shortness of breath  Fever of 100F or higher  Black, tarry-looking stools  For urgent or emergent issues, a gastroenterologist can be reached at any hour by calling [phone].   DIET:  We do recommend a small meal at first, but then you may proceed to your regular diet.  Drink plenty of fluids but you should avoid alcoholic  beverages for 24 hours.  ACTIVITY:  You should plan to take it easy for the rest of today and you should NOT DRIVE or use heavy machinery until tomorrow (because of the sedation medicines used during the test).    FOLLOW UP: Our staff will call the number listed on your records the next business day following your procedure to check on you and address any questions or concerns that you may have regarding the information given to you following your procedure. If we do not reach you, we will leave a message.  However, if you are feeling well and you are not experiencing any problems, there is no need to return our call.  We will assume that you have returned to your regular daily activities without incident.  If any biopsies were taken you will be contacted by phone or by letter within the next 1-3 weeks.  Please call us at [phone] if you have not heard about the biopsies in 3 weeks.    SIGNATURES/CONFIDENTIALITY: You and/or your care partner have signed paperwork which will be entered into your electronic medical record.  These signatures attest to the fact that that the information above on your After Visit Summary has been reviewed and is understood.  Full responsibility of the confidentiality of this discharge information lies with you and/or your care-partner.

## 2018-06-21 ENCOUNTER — Telehealth: Payer: Self-pay | Admitting: *Deleted

## 2018-06-21 ENCOUNTER — Other Ambulatory Visit: Payer: Self-pay

## 2018-06-21 DIAGNOSIS — K254 Chronic or unspecified gastric ulcer with hemorrhage: Secondary | ICD-10-CM

## 2018-06-21 NOTE — Telephone Encounter (Signed)
  Follow up Call-  Call back number 06/18/2018  Post procedure Call Back phone  # [phone]  Permission to leave phone message Yes  Some recent data might be hidden    [facility]

## 2018-06-21 NOTE — Telephone Encounter (Signed)
  Follow up Call-  Call back number 06/18/2018  Post procedure Call Back phone  # [phone]  Permission to leave phone message Yes  Some recent data might be hidden    [facility] 

## 2018-06-22 ENCOUNTER — Ambulatory Visit: Payer: Self-pay | Admitting: Gastroenterology

## 2018-06-25 ENCOUNTER — Encounter: Payer: Self-pay | Admitting: Gastroenterology

## 2018-08-02 ENCOUNTER — Telehealth: Payer: Self-pay | Admitting: Physician Assistant

## 2018-08-02 DIAGNOSIS — K250 Acute gastric ulcer with hemorrhage: Secondary | ICD-10-CM

## 2018-08-02 MED ORDER — PANTOPRAZOLE SODIUM 40 MG PO TBEC: 40.0000 mg | DELAYED_RELEASE_TABLET | Freq: Two times a day (BID) | ORAL | 3 refills | Status: DC

## 2018-08-02 NOTE — Telephone Encounter (Signed)
Copied from CRM [id number]. Topic: Quick Communication - Rx Refill/Question >> Aug 02, 2018 12:54 PM Louie Bun, Rosey Bath D wrote: Medication: pantoprazole (PROTONIX) 40 MG tablet (Expired)    Has the patient contacted their pharmacy? Yes (Agent: If no, request that the patient contact the pharmacy for the refill.) (Agent: If yes, when and what did the pharmacy advise?)  Preferred Pharmacy (with phone number or street name): [id number] - [place], [place] - [address]  Agent: Please be advised that RX refills may take up to 3 business days. We ask that you follow-up with your pharmacy.

## 2018-08-02 NOTE — Telephone Encounter (Signed)
Rx for Protonix was sent to the patient preferred pharmacy

## 2018-08-03 ENCOUNTER — Encounter: Payer: Self-pay | Admitting: Gastroenterology

## 2018-11-05 ENCOUNTER — Other Ambulatory Visit (INDEPENDENT_AMBULATORY_CARE_PROVIDER_SITE_OTHER): Payer: Managed Care, Other (non HMO)

## 2018-11-05 ENCOUNTER — Ambulatory Visit (INDEPENDENT_AMBULATORY_CARE_PROVIDER_SITE_OTHER): Payer: Managed Care, Other (non HMO) | Admitting: Gastroenterology

## 2018-11-05 ENCOUNTER — Encounter: Payer: Self-pay | Admitting: Gastroenterology

## 2018-11-05 VITALS — BP 108/78 | HR 60 | Temp 97.8°F | Ht 68.5 in | Wt 166.1 lb

## 2018-11-05 DIAGNOSIS — R109 Unspecified abdominal pain: Secondary | ICD-10-CM

## 2018-11-05 DIAGNOSIS — K253 Acute gastric ulcer without hemorrhage or perforation: Secondary | ICD-10-CM

## 2018-11-05 DIAGNOSIS — K2990 Gastroduodenitis, unspecified, without bleeding: Secondary | ICD-10-CM

## 2018-11-05 DIAGNOSIS — K269 Duodenal ulcer, unspecified as acute or chronic, without hemorrhage or perforation: Secondary | ICD-10-CM | POA: Diagnosis not present

## 2018-11-05 DIAGNOSIS — R1031 Right lower quadrant pain: Secondary | ICD-10-CM

## 2018-11-05 DIAGNOSIS — K257 Chronic gastric ulcer without hemorrhage or perforation: Secondary | ICD-10-CM

## 2018-11-05 DIAGNOSIS — D500 Iron deficiency anemia secondary to blood loss (chronic): Secondary | ICD-10-CM

## 2018-11-05 DIAGNOSIS — K254 Chronic or unspecified gastric ulcer with hemorrhage: Secondary | ICD-10-CM

## 2018-11-05 DIAGNOSIS — K2970 Gastritis, unspecified, without bleeding: Secondary | ICD-10-CM

## 2018-11-05 LAB — HEPATIC FUNCTION PANEL
ALT: 27 U/L (ref 0–53)
AST: 19 U/L (ref 0–37)
Albumin: 4.6 g/dL (ref 3.5–5.2)
Alkaline Phosphatase: 89 U/L (ref 39–117)
Bilirubin, Direct: 0.2 mg/dL (ref 0.0–0.3)
Total Bilirubin: 1.4 mg/dL — ABNORMAL HIGH (ref 0.2–1.2)
Total Protein: 7.4 g/dL (ref 6.0–8.3)

## 2018-11-05 LAB — CBC WITH DIFFERENTIAL/PLATELET
Basophils Absolute: 0.1 10*3/uL (ref 0.0–0.1)
Basophils Relative: 0.9 % (ref 0.0–3.0)
Eosinophils Absolute: 0.2 10*3/uL (ref 0.0–0.7)
Eosinophils Relative: 3.4 % (ref 0.0–5.0)
HCT: 47.3 % (ref 39.0–52.0)
Hemoglobin: 16 g/dL (ref 13.0–17.0)
Lymphocytes Relative: 26.7 % (ref 12.0–46.0)
Lymphs Abs: 1.8 10*3/uL (ref 0.7–4.0)
MCHC: 33.9 g/dL (ref 30.0–36.0)
MCV: 84.2 fl (ref 78.0–100.0)
Monocytes Absolute: 0.9 10*3/uL (ref 0.1–1.0)
Monocytes Relative: 13.4 % — ABNORMAL HIGH (ref 3.0–12.0)
Neutro Abs: 3.7 10*3/uL (ref 1.4–7.7)
Neutrophils Relative %: 55.6 % (ref 43.0–77.0)
Platelets: 226 10*3/uL (ref 150.0–400.0)
RBC: 5.61 Mil/uL (ref 4.22–5.81)
RDW: 15.6 % — ABNORMAL HIGH (ref 11.5–15.5)
WBC: 6.6 10*3/uL (ref 4.0–10.5)

## 2018-11-05 LAB — IBC + FERRITIN
Ferritin: 16.6 ng/mL — ABNORMAL LOW (ref 22.0–322.0)
Iron: 67 ug/dL (ref 42–165)
Saturation Ratios: 15.1 % — ABNORMAL LOW (ref 20.0–50.0)
Transferrin: 317 mg/dL (ref 212.0–360.0)

## 2018-11-05 LAB — LIPASE: Lipase: 56 U/L (ref 11.0–59.0)

## 2018-11-05 LAB — VITAMIN B12: Vitamin B-12: 320 pg/mL (ref 211–911)

## 2018-11-05 LAB — FOLATE: Folate: 10.9 ng/mL (ref >5.9)

## 2018-11-05 MED ORDER — PANTOPRAZOLE SODIUM 40 MG PO TBEC: 40.0000 mg | DELAYED_RELEASE_TABLET | Freq: Every day | ORAL | 3 refills | Status: DC

## 2018-11-05 NOTE — Patient Instructions (Addendum)
If you are age 37 or older, your body mass index should be between 23-30. Your Body mass index is 24.89 kg/m. If this is out of the aforementioned range listed, please consider follow up with your Primary Care Provider.  If you are age 62 or younger, your body mass index should be between 19-25. Your Body mass index is 24.89 kg/m. If this is out of the aformentioned range listed, please consider follow up with your Primary Care Provider.   Your provider has requested that you go to the basement level for lab work before leaving today. Press "B" on the elevator. The lab is located at the first door on the left as you exit the elevator.   Your provider has requested that you decrease Pantoprazole to once daily.   Thank you for choosing me and [facility].  Dr. Rush Landmark

## 2018-11-06 LAB — IRON,TIBC AND FERRITIN PANEL
%SAT: 22 % (calc) (ref 20–48)
Ferritin: 19 ng/mL — ABNORMAL LOW (ref 38–380)
Iron: 85 ug/dL (ref 50–180)
TIBC: 378 mcg/dL (calc) (ref 250–425)

## 2018-11-07 ENCOUNTER — Encounter: Payer: Self-pay | Admitting: Gastroenterology

## 2018-11-07 DIAGNOSIS — K2970 Gastritis, unspecified, without bleeding: Secondary | ICD-10-CM | POA: Insufficient documentation

## 2018-11-07 DIAGNOSIS — D500 Iron deficiency anemia secondary to blood loss (chronic): Secondary | ICD-10-CM | POA: Insufficient documentation

## 2018-11-07 DIAGNOSIS — R1031 Right lower quadrant pain: Secondary | ICD-10-CM | POA: Insufficient documentation

## 2018-11-07 NOTE — Progress Notes (Addendum)
GASTROENTEROLOGY OUTPATIENT CLINIC VISIT   Primary Care Provider Waldon MerlMartin, William C, PA-C [address] [place] [id number] [phone]  Patient Profile: Lupita LeashRandall Michael Rabel is a 38 y.o. male with a pmh significant for anxiety/depression, previous alcohol use disorder (now abstinent), hypertension, migraines, peptic ulcer disease (manifested on endoscopy in setting of hospitalization for melena), hiatal hernia, GERD.  The patient presents to the [facility] for an evaluation and management of problem(s) noted below:  Problem List 1. Chronic gastric ulcer without hemorrhage and without perforation   2. Duodenal ulcer   3. Gastritis and gastroduodenitis   4. Right lower quadrant abdominal pain   5. Anemia due to blood loss     History of Present Illness Please see initial consultation note in hospital as well as progress note by PA [facility] for full details of HPI.  Interval History The patient was last seen in March for an upper endoscopy which was performed earlier than expected due to the patient having persistent abdominal pain and discomfort.  At time of endoscopy we found persistence of duodenal ulcer and gastric ulcers but improving all compared to 1 month prior.  The plan was for a follow-up endoscopy in 2 to 3 months.  The patient scheduled a unanticipated follow-up a few weeks back due to having persistent abdominal discomfort as well as pain in the right lower quadrant that was different than his prior.  However, the patient notes that as soon as he was off the phone scheduling the clinic visit he began to change his diet and decrease his alcohol intake significantly.  He is now abstinent of alcohol.  He has been gaining weight.  He no longer smokes either.  In this particular setting he has had significant improvement in his abdominal discomfort and it is minimally present if there at all.  He almost canceled today's appointment.  His bowel movements  remain present and hard and he continues taking his iron twice daily.  At this point in time, the patient wonders if he still needs to be taking iron.  He denies any nausea or vomiting currently.  The previous midepigastric abdominal discomfort has improved as well.  His GERD symptoms are improved as well as he continues taking twice daily PPI.  The patient has had some sensation at times of incomplete evacuation over the course of the last few months but after he uses the restroom in time passes he will have improvement of this.  He only goes once daily.  His bowel movements are soft though formed and light brown in color.  If he eats significantly larger portions he will get discomfort so he has managed to minimize that.  GI Review of Systems Positive as above Negative for dysphagia, odynophagia, early satiety, bloating, melena, hematochezia, change in bowel habits  Review of Systems General: Positive for weight gain; denies fevers/chills HEENT: Denies oral lesions Cardiovascular: Denies chest pain Pulmonary: Denies shortness of breath Gastroenterological: See HPI Genitourinary: Denies darkened urine Hematological: Denies easy bruising/bleeding Endocrine: Denies temperature intolerance Dermatological: Denies jaundice Psychological: Mood is stable   Medications Current Outpatient Medications  Medication Sig Dispense Refill   sucralfate (CARAFATE) 1 GM/10ML suspension Take 10 mLs (1 g total) by mouth 2 (two) times daily. 1120 mL 0   ferrous gluconate (FERGON) 324 MG tablet Take 1 tablet (324 mg total) by mouth daily with breakfast. (Patient not taking: Reported on 11/05/2018) 30 tablet 3   pantoprazole (PROTONIX) 40 MG tablet Take 1  tablet (40 mg total) by mouth daily. 90 tablet 3   No current facility-administered medications for this visit.     Allergies Allergies  Allergen Reactions   Other Other (See Comments)    NARCOTICS. Pt is recovering alcoholic so prefers no strong  meds.   Codeine Nausea And Vomiting    Histories Past Medical History:  Diagnosis Date   Anxiety    DEPRESSION, MAJOR, SEVERE 09/13/2009   Qualifier: Diagnosis of  By: Debby BudNorins MD, Rosalyn GessMichael E    Duodenal ulcer    Gastric ulcer    Hiatal hernia    History of alcohol abuse    HTN (hypertension) 10/09/2011   MIGRAINE HEADACHE 04/10/2007   Qualifier: Diagnosis of  By: Genelle GatherShoffner CMA, [place]     Substance abuse [facility](HCC)    Past Surgical History:  Procedure Laterality Date   BIOPSY  05/17/2018   Procedure: BIOPSY;  Surgeon: Meridee ScoreMansouraty, Netty StarringGabriel Jr., MD;  [facility];  Service: Gastroenterology;;   ESOPHAGOGASTRODUODENOSCOPY (EGD) WITH PROPOFOL N/A 05/17/2018   Procedure: ESOPHAGOGASTRODUODENOSCOPY (EGD) WITH PROPOFOL;  Surgeon: Lemar LoftyMansouraty, Natalyn Szymanowski Jr., MD;  [facility];  Service: Gastroenterology;  Laterality: N/A;   finger surgury     ORIF ANKLE FRACTURE Right    WISDOM TOOTH EXTRACTION     Social History   Socioeconomic History   Marital status: Married    Spouse name: Not on file   Number of children: 1   Years of education: 14   Highest education level: Not on file  Occupational History   Occupation: Chiropodistmachinist    Employer: [employer]  Social Needs   Financial resource strain: Not on file   Food insecurity    Worry: Not on file    Inability: Not on file   Transportation needs    Medical: Not on file    Non-medical: Not on file  Tobacco Use   Smoking status: Former Smoker    Types: Cigarettes   Smokeless tobacco: Former NeurosurgeonUser    Types: Chew    Quit date: 05/20/2018   Tobacco comment: quit 2014  Substance and Sexual Activity   Alcohol use: No    Comment: recovering alcoholic -- 68 months sober.    Drug use: No    Frequency: 30.0 times per week    Types: Benzodiazepines    Comment: previous benzo addiction   Sexual activity: Yes    Partners: Female  Lifestyle   Physical activity    Days per week: Not on file    Minutes  per session: Not on file   Stress: Not on file  Relationships   Social connections    Talks on phone: Not on file    Gets together: Not on file    Attends religious service: Not on file    Active member of club or organization: Not on file    Attends meetings of clubs or organizations: Not on file    Relationship status: Not on file   Intimate partner violence    Fear of current or ex partner: Not on file    Emotionally abused: Not on file    Physically abused: Not on file    Forced sexual activity: Not on file  Other Topics Concern   Not on file  Social History Narrative   HSG, Scientist, 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 Married -'11(?). 1 dtr - '12. Work - Chartered certified accountantmachinist.      09/03/2012 AHW Casimiro NeedleMichael was born [place]. He has a younger sister. His parents  are still together and healthy. He graduated from high school, and has all but a degree at [employer] in Musicianmachining. 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 He has been married for 5 years, and has an 5328-month-old daughter. He currently lives with his wife and daughter. 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 His social support system consists of his sponsor, wife, mother, father, sister, brother-in-law, and friends. 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 He denies any legal problems. He enjoys golf and firearms. 09/03/2012 AHW   Family History  Problem Relation Age of Onset   Hypertension Mother    Hypertension Father    Alcohol abuse Father    Alcohol abuse Paternal Aunt    Alcohol abuse Paternal Uncle    Alcohol abuse Paternal Grandfather    Schizophrenia Paternal Grandfather    Alcohol abuse Paternal Grandmother    Colon cancer Maternal Grandfather    Esophageal cancer Neg Hx    Pancreatic cancer Neg Hx    Stomach cancer Neg Hx    Inflammatory bowel disease Neg Hx    I have reviewed his medical, social, and family history in detail and updated the  electronic medical record as necessary.    PHYSICAL EXAMINATION  BP 108/78 (BP Location: Left Arm, Patient Position: Sitting, Cuff Size: Normal)    Pulse 60    Temp 97.8 F (36.6 C)    Ht 5' 8.5" (1.74 m) Comment: height measured without shoes   Wt 166 lb 2 oz (75.4 kg)    BMI 24.89 kg/m  Wt Readings from Last 3 Encounters:  11/05/18 166 lb 2 oz (75.4 kg)  06/18/18 153 lb (69.4 kg)  06/09/18 153 lb (69.4 kg)  GEN: NAD, appears stated age, doesn't appear chronically ill PSYCH: Cooperative, without pressured speech EYE: Conjunctivae pink, sclerae anicteric ENT: MMM CV: RR without R/Gs  RESP: CTAB posteriorly GI: NABS, soft, NT/ND, without rebound or guarding, no HSM appreciated MSK/EXT: No lower extremity edema SKIN: No jaundice NEURO:  Alert & Oriented x 3, no focal deficits   REVIEW OF DATA  I reviewed the following data at the time of this encounter:  GI Procedures and Studies  March 2020 EGD - No gross lesions in proximal/middle esophagus. - LA Grade A reflux esophagitis at distal esophagus into GE J[place].. - Biopsies were taken with a cold forceps for histology in the entire esophagus for EoE. - Medium-sized hiatal hernia. - Non-bleeding gastric ulcers with a clean ulcer base (Forrest Class III) - improved from prior. - Scar in the prepyloric region of the stomach from healing ulcer. - Erythematous mucosa in the gastric body and antrum. Biopsied for HP. - Non-bleeding duodenal ulcers with a clean ulcer base (Forrest Class III). Biopsied for HP. - No gross lesions in the duodenal bulb, in the first portion of the duodenum and in the second portion of the duodenum. Biopsied for enteropathy/Celiac.  Laboratory Studies  Reviewed in epic  Imaging Studies  No relevant studies to review   ASSESSMENT  Mr. Cedric FishmanSharpe is a 38 y.o. male with a pmh significant for anxiety/depression, previous alcohol use disorder (now abstinent), hypertension, migraines, peptic ulcer disease  (manifested on endoscopy in setting of hospitalization for melena), hiatal hernia, GERD.  The patient is seen today for evaluation and management of:  1. Chronic gastric ulcer without hemorrhage and without perforation   2. Duodenal ulcer   3. Gastritis and gastroduodenitis   4. Right  lower quadrant abdominal pain   5. Anemia due to blood loss    The patient is clinically and hemodynamically stable.  He has done well in the setting of his alcohol cessation and tobacco cessation.  Not clear the underlying pathology of his symptoms in regards to his right lower quadrant abdominal discomfort but they are currently not present.  He had a significant anemia at the time of his hospitalization and so we should recheck his labs and see how his blood counts are doing at this point in time.  Some of his GI upset could have been a result of his continued iron usage.  I likely will be able to decrease his iron intake but we need to check his blood count as his iron disease.  If he has any evidence of iron deficiency then plan to not only repeat his EGD to ensure that gastric ulcer and duodenal ulcers have healed completely but may consider a colonoscopy if he remains a patient or iron insufficiency.  Otherwise I would continue to optimize the patient's bowel habits by being on a fiber supplement daily.  Also will be interesting to evaluate the patient and see if his sensation of incomplete evacuation continues.  Likely will be able to start decreasing his I think if he needs it at all.  BP I will go down to once daily once we perform his endoscopy to check on healing.  The risks and benefits of endoscopic evaluation were discussed with the patient; these include but are not limited to the risk of perforation, infection, bleeding, missed lesions, lack of diagnosis, severe illness requiring hospitalization, as well as anesthesia and sedation related illnesses.  The patient is agreeable to proceed.  All patient questions  were answered, to the best of my ability, and the patient agrees to the aforementioned plan of action with follow-up as indicated.   PLAN  Laboratories as outlined below Transition PPI to once daily Follow-up iron indices and patient is no longer iron deficient/sufficient then will decrease his iron intake to daily once versus every other day If any evidence of iron deficiency or iron insufficiency we will consider addition of a diagnostic colonoscopy Abdominal discomfort return we will consider a cross-sectional diagnostic colonoscopy if not already completed   Orders Placed This Encounter  Procedures   CBC   Lipase   Hepatic function panel   IBC + Ferritin   B12   Folate   Ambulatory referral to Gastroenterology    New Prescriptions   PANTOPRAZOLE (PROTONIX) 40 MG TABLET    Take 1 tablet (40 mg total) by mouth daily.   Modified Medications   No medications on file    Planned Follow Up No follow-ups on file.   Justice Britain, MD [facility] Office # [phone]

## 2018-11-18 ENCOUNTER — Telehealth: Payer: Self-pay

## 2018-11-18 NOTE — Telephone Encounter (Signed)
Covid-19 screening questions   Do you now or have you had a fever in the last 14 days?  Do you have any respiratory symptoms of shortness of breath or cough now or in the last 14 days?  Do you have any family members or close contacts with diagnosed or suspected Covid-19 in the past 14 days?  Have you been tested for Covid-19 and found to be positive?      L/m to c/b.  

## 2018-11-19 ENCOUNTER — Encounter: Payer: Self-pay | Admitting: Gastroenterology

## 2018-11-19 ENCOUNTER — Ambulatory Visit (AMBULATORY_SURGERY_CENTER): Payer: Managed Care, Other (non HMO) | Admitting: Gastroenterology

## 2018-11-19 ENCOUNTER — Other Ambulatory Visit: Payer: Self-pay

## 2018-11-19 VITALS — BP 129/88 | HR 69 | Temp 98.2°F | Resp 12 | Ht 68.0 in | Wt 166.0 lb

## 2018-11-19 DIAGNOSIS — K257 Chronic gastric ulcer without hemorrhage or perforation: Secondary | ICD-10-CM

## 2018-11-19 DIAGNOSIS — K210 Gastro-esophageal reflux disease with esophagitis: Secondary | ICD-10-CM

## 2018-11-19 DIAGNOSIS — K449 Diaphragmatic hernia without obstruction or gangrene: Secondary | ICD-10-CM

## 2018-11-19 MED ORDER — SODIUM CHLORIDE 0.9 % IV SOLN: 500.0000 mL | Freq: Once | INTRAVENOUS | Status: DC

## 2018-11-19 NOTE — Patient Instructions (Signed)
Refer to procedure report page 3 for detailed instructions on medication   YOU HAD AN ENDOSCOPIC PROCEDURE TODAY AT [facility]:   Refer to the procedure report that was given to you for any specific questions about what was found during the examination.  If the procedure report does not answer your questions, please call your gastroenterologist to clarify.  If you requested that your care partner not be given the details of your procedure findings, then the procedure report has been included in a sealed envelope for you to review at your convenience later.  YOU SHOULD EXPECT: Some feelings of bloating in the abdomen. Passage of more gas than usual.  Walking can help get rid of the air that was put into your GI tract during the procedure and reduce the bloating. If you had a lower endoscopy (such as a colonoscopy or flexible sigmoidoscopy) you may notice spotting of blood in your stool or on the toilet paper. If you underwent a bowel prep for your procedure, you may not have a normal bowel movement for a few days.  Please Note:  You might notice some irritation and congestion in your nose or some drainage.  This is from the oxygen used during your procedure.  There is no need for concern and it should clear up in a day or so.  SYMPTOMS TO REPORT IMMEDIATELY:   Following upper endoscopy (EGD)  Vomiting of blood or coffee ground material  New chest pain or pain under the shoulder blades  Painful or persistently difficult swallowing  New shortness of breath  Fever of 100F or higher  Black, tarry-looking stools  For urgent or emergent issues, a gastroenterologist can be reached at any hour by calling [phone].   DIET:  We do recommend a small meal at first, but then you may proceed to your regular diet.  Drink plenty of fluids but you should avoid alcoholic beverages for 24 hours.  ACTIVITY:  You should plan to take it easy for the rest of today and you should NOT DRIVE  or use heavy machinery until tomorrow (because of the sedation medicines used during the test).    FOLLOW UP: Our staff will call the number listed on your records 48-72 hours following your procedure to check on you and address any questions or concerns that you may have regarding the information given to you following your procedure. If we do not reach you, we will leave a message.  We will attempt to reach you two times.  During this call, we will ask if you have developed any symptoms of COVID 19. If you develop any symptoms (ie: fever, flu-like symptoms, shortness of breath, cough etc.) before then, please call [phone].  If you test positive for Covid 19 in the 2 weeks post procedure, please call and report this information to [place].    If any biopsies were taken you will be contacted by phone or by letter within the next 1-3 weeks.  Please call us at [phone] if you have not heard about the biopsies in 3 weeks.    SIGNATURES/CONFIDENTIALITY: You and/or your care partner have signed paperwork which will be entered into your electronic medical record.  These signatures attest to the fact that that the information above on your After Visit Summary has been reviewed and is understood.  Full responsibility of the confidentiality of this discharge information lies with you and/or your care-partner.

## 2018-11-19 NOTE — Progress Notes (Signed)
Temp taken by AR VS taken by CW  

## 2018-11-19 NOTE — Progress Notes (Signed)
Report to PACU, RN, vss, BBS= Clear.  

## 2018-11-19 NOTE — Progress Notes (Signed)
Called to room to assist during endoscopic procedure.  Patient ID and intended procedure confirmed with present staff. Received instructions for my participation in the procedure from the performing physician.  

## 2018-11-19 NOTE — Op Note (Signed)
[facility] Patient Name: Trevor Mendez Procedure Date: 11/19/2018 3:33 PM MRN: [id number] Endoscopist: Justice Britain , MD Age: 38 Referring MD:  Date of Birth: 02-01-1981 Gender: Male Account #: [id number] Procedure:                Upper GI endoscopy Indications:              Follow-up of chronic gastric ulcer, Follow-up of                            chronic duodenal ulcer Medicines:                Monitored Anesthesia Care Procedure:                Pre-Anesthesia Assessment:                           - Prior to the procedure, a History and Physical                            was performed, and patient medications and                            allergies were reviewed. The patient's tolerance of                            previous anesthesia was also reviewed. The risks                            and benefits of the procedure and the sedation                            options and risks were discussed with the patient.                            All questions were answered, and informed consent                            was obtained. Prior Anticoagulants: The patient has                            taken no previous anticoagulant or antiplatelet                            agents. ASA Grade Assessment: II - A patient with                            mild systemic disease. After reviewing the risks                            and benefits, the patient was deemed in                            satisfactory condition to undergo the procedure.  After obtaining informed consent, the endoscope was                            passed under direct vision. Throughout the                            procedure, the patient's blood pressure, pulse, and                            oxygen saturations were monitored continuously. The                            Endoscope was introduced through the mouth, and                            advanced to the second part of  duodenum. The upper                            GI endoscopy was accomplished without difficulty.                            The patient tolerated the procedure. Scope In: Scope Out: Findings:                 LA Grade B (one or more mucosal breaks greater than                            5 mm, not extending between the tops of two mucosal                            folds) esophagitis with no bleeding was found 35 to                            36 cm from the incisors. This was biopsied with a                            cold forceps for histology to rule out Barrett's                            esophagus.                           Normal mucosa was found in the entire esophagus                            otherwise.                           A 3 cm hiatal hernia was present.                           A small healed ulcer was found in the prepyloric  region of the stomach. The scar tissue was healthy                            in appearance.                           No other gross lesions were noted in the entire                            examined stomach.                           A small healed ulcer was found in the duodenal                            bulb. The scar tissue was healthy in appearance.                           No gross lesions were noted in the second portion                            of the duodenum. Complications:            No immediate complications. Estimated Blood Loss:     Estimated blood loss was minimal. Impression:               - LA Grade B reflux esophagitis. Biopsied.                           - 3 cm hiatal hernia.                           - Scar in the prepyloric region of the stomach. No                            other gross lesions in the stomach.                           - Duodenal scar. No other gross lesions in the                            second portion of the duodenum. Recommendation:           - The patient will be  observed post-procedure,                            until all discharge criteria are met.                           - Discharge patient to home.                           - Patient has a contact number available for                            emergencies. The signs  and symptoms of potential                            delayed complications were discussed with the                            patient. Return to normal activities tomorrow.                            Written discharge instructions were provided to the                            patient.                           - Resume previous diet.                           - Continue PPI once daily for now.                           - Plan to continue QOD Iron and recheck Iron                            studies in 6-weeks (will order after biopsy results                            return). Decision to pursue colonsocopy to be                            dictated by Iron deficiency/insufficiency based on                            labs.                           - Plan to repeat EGD prior to end of year.                           - Await pathology results.                           - The findings and recommendations were discussed                            with the patient. Justice Britain, MD 11/19/2018 4:06:51 PM

## 2018-11-23 ENCOUNTER — Telehealth: Payer: Self-pay | Admitting: *Deleted

## 2018-11-23 NOTE — Telephone Encounter (Signed)
1. Have you developed a fever since your procedure? no  2.   Have you had an respiratory symptoms (SOB or cough) since your procedure? no  3.   Have you tested positive for COVID 19 since your procedure no  4.   Have you had any family members/close contacts diagnosed with the COVID 19 since your procedure?  no   If yes to any of these questions please route to Joylene John, RN and Alphonsa Gin, Therapist, sports.     Follow up Call-  Call back number 11/19/2018 06/18/2018  Post procedure Call Back phone  # [phone]  Permission to leave phone message Yes Yes  Some recent data might be hidden     Patient questions:  Do you have a fever, pain , or abdominal swelling? No. Pain Score  0 *  Have you tolerated food without any problems? Yes.    Have you been able to return to your normal activities? Yes.    Do you have any questions about your discharge instructions: Diet   No. Medications  No. Follow up visit  No.  Do you have questions or concerns about your Care? No.  Actions: * If pain score is 4 or above: No action needed, pain <4.

## 2018-11-26 ENCOUNTER — Encounter: Payer: Self-pay | Admitting: Physician Assistant

## 2018-11-26 ENCOUNTER — Encounter: Payer: Self-pay | Admitting: Gastroenterology

## 2018-11-26 ENCOUNTER — Ambulatory Visit (INDEPENDENT_AMBULATORY_CARE_PROVIDER_SITE_OTHER): Payer: Managed Care, Other (non HMO) | Admitting: Physician Assistant

## 2018-11-26 ENCOUNTER — Other Ambulatory Visit: Payer: Self-pay

## 2018-11-26 VITALS — BP 120/80 | HR 76 | Temp 98.2°F | Resp 16 | Ht 69.0 in | Wt 168.0 lb

## 2018-11-26 DIAGNOSIS — M545 Low back pain, unspecified: Secondary | ICD-10-CM

## 2018-11-26 NOTE — Progress Notes (Signed)
Patient presents to clinic today c/o episodic pain in left lower back x 6-8 months but not occurring daily. Pain occurs only with leaning down and typically after he has been working for several hours.  Pain is sharp and about 9/10, lasting for 2-3 minutes before resolving. Sometimes will have radiation into posterior thigh noted as a pulling sensation. Denies radiation elsewhere. Has not taken anything for symptoms. Notes he tries to stretch out before work shifts, always wears very supportive footwear, and follows safe lifting practices.   Past Medical History:  Diagnosis Date  . Anxiety   . DEPRESSION, MAJOR, SEVERE 09/13/2009   Qualifier: Diagnosis of  By: Trevor Hedges MD, Trevor Mendez   . Duodenal ulcer   . Gastric ulcer   . Hiatal hernia   . History of alcohol abuse   . HTN (hypertension) 10/09/2011  . MIGRAINE HEADACHE 04/10/2007   Qualifier: Diagnosis of  By: Trevor Mendez CMA, [place]    . Substance abuse ([place])     Current Outpatient Medications on File Prior to Visit  Medication Sig Dispense Refill  . ferrous gluconate (FERGON) 324 MG tablet Take 1 tablet (324 mg total) by mouth daily with breakfast. 30 tablet 3  . pantoprazole (PROTONIX) 40 MG tablet Take 1 tablet (40 mg total) by mouth daily. 90 tablet 3  . sucralfate (CARAFATE) 1 GM/10ML suspension Take 10 mLs (1 g total) by mouth 2 (two) times daily. 1120 mL 0   No current facility-administered medications on file prior to visit.     Allergies  Allergen Reactions  . Other Other (See Comments)    NARCOTICS. 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  . Codeine Nausea And Vomiting    Family History  Problem Relation Age of Onset  . Hypertension Mother   . Hypertension Father   . Alcohol abuse Father   . Alcohol abuse Paternal Aunt   . Alcohol abuse Paternal Uncle   . Alcohol abuse Paternal Grandfather   . Schizophrenia Paternal Grandfather   . Alcohol abuse Paternal Grandmother   . Colon cancer Maternal Grandfather    . Esophageal cancer Neg Hx   . Pancreatic cancer Neg Hx   . Stomach cancer Neg Hx   . Inflammatory bowel disease Neg Hx     Social History   Socioeconomic History  . Marital status: Married    Spouse name: Not on file  . Number of children: 1  . Years of education: 50  . Highest education level: Not on file  Occupational History  . Occupation: Information systems manager: [employer]  Social Needs  . Financial resource strain: Not on file  . Food insecurity    Worry: Not on file    Inability: Not on file  . Transportation needs    Medical: Not on file    Non-medical: Not on file  Tobacco Use  . Smoking status: Former Smoker    Types: Cigarettes  . Smokeless tobacco: Former Systems developer    Types: Chew    Quit date: 05/20/2018  . Tobacco comment: quit 2014  Substance and Sexual Activity  . Alcohol use: No    Comment: recovering alcoholic -- 68 months sober.   . Drug use: No    Frequency: 30.0 times per week    Types: Benzodiazepines    Comment: previous benzo addiction  . Sexual activity: Yes    Partners: Female  Lifestyle  . Physical activity    Days per week: Not on file  Minutes per session: Not on file  . Stress: Not on file  Relationships  . Social Musician on phone: Not on file    Gets together: Not on file    Attends religious service: Not on file    Active member of club or organization: Not on file    Attends meetings of clubs or organizations: Not on file    Relationship status: Not on file  Other Topics Concern  . 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      09/03/2012 [name] was born and grew up in [place], [place]. He has a younger sister. His parents are still together and healthy. He graduated from high school, and has all but a degree at [employer] in Musician. 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  He has been married for 5 years, and has an 79-month-old daughter. He currently lives with his wife and daughter. 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 His social support system consists of his sponsor, wife, mother, father, sister, brother-in-law, and friends. He affiliates as a International aid/development worker. He denies any legal problems. He enjoys golf and firearms. 09/03/2012 A[facility]   Review of Systems - See HPI.  All other ROS are negative.  BP 120/80   Pulse 76   Temp 98.2 F (36.8 C) (Skin)   Resp 16   Ht 5\' 9"  (1.753 m)   Wt 168 lb (76.2 kg)   SpO2 98%   BMI 24.81 kg/m   Physical Exam Vitals signs reviewed.  Constitutional:      Appearance: Normal appearance.  HENT:     Head: Normocephalic and atraumatic.     Right Ear: Tympanic membrane normal.     Left Ear: Tympanic membrane normal.  Neck:     Musculoskeletal: Neck supple.  Cardiovascular:     Rate and Rhythm: Normal rate and regular rhythm.     Heart sounds: Normal heart sounds.  Pulmonary:     Effort: Pulmonary effort is normal.  Musculoskeletal:     Right hip: Normal.     Left hip: Normal.     Cervical back: Normal.     Thoracic back: Normal.     Lumbar back: He exhibits normal range of motion, no tenderness, no bony tenderness, no swelling, no pain and no spasm.  Neurological:     General: No focal deficit present.     Mental Status: He is alert and oriented to person, place, and time.  Psychiatric:        Mood and Affect: Mood normal.     Recent Results (from the past 2160 hour(s))  Folate     Status: None   Collection Time: 11/05/18 10:41 AM  Result Value Ref Range   Folate 10.9 >5.9 ng/mL  B12     Status: None   Collection Time: 11/05/18 10:41 AM  Result Value Ref Range   Vitamin B-12 320 211 - 911 pg/mL  IBC + Ferritin     Status: Abnormal   Collection Time: 11/05/18 10:41 AM  Result Value Ref Range   Iron 67 42 - 165 ug/dL   Transferrin 945.8 592.9 - 360.0 mg/dL   Saturation Ratios 24.4 (L) 20.0 -  50.0 %   Ferritin 16.6 (L) 22.0 - 322.0 ng/mL  Hepatic function panel     Status: Abnormal  Collection Time: 11/05/18 10:41 AM  Result Value Ref Range   Total Bilirubin 1.4 (H) 0.2 - 1.2 mg/dL   Bilirubin, Direct 0.2 0.0 - 0.3 mg/dL   Alkaline Phosphatase 89 39 - 117 U/L   AST 19 0 - 37 U/L   ALT 27 0 - 53 U/L   Total Protein 7.4 6.0 - 8.3 g/dL   Albumin 4.6 3.5 - 5.2 g/dL  Lipase     Status: None   Collection Time: 11/05/18 10:41 AM  Result Value Ref Range   Lipase 56.0 11.0 - 59.0 U/L  Iron, TIBC and Ferritin Panel     Status: Abnormal   Collection Time: 11/05/18 10:41 AM  Result Value Ref Range   Iron 85 50 - 180 mcg/dL   TIBC 409378 811250 - 914425 mcg/dL (calc)   %SAT 22 20 - 48 % (calc)   Ferritin 19 (L) 38 - 380 ng/mL  CBC with Differential/Platelet     Status: Abnormal   Collection Time: 11/05/18 10:41 AM  Result Value Ref Range   WBC 6.6 4.0 - 10.5 K/uL   RBC 5.61 4.22 - 5.81 Mil/uL   Hemoglobin 16.0 13.0 - 17.0 g/dL   HCT 78.247.3 95.639.0 - 21.352.0 %   MCV 84.2 78.0 - 100.0 fl   MCHC 33.9 30.0 - 36.0 g/dL   RDW 08.615.6 (H) 57.811.5 - 46.915.5 %   Platelets 226.0 150.0 - 400.0 K/uL   Neutrophils Relative % 55.6 43.0 - 77.0 %   Lymphocytes Relative 26.7 12.0 - 46.0 %   Monocytes Relative 13.4 (H) 3.0 - 12.0 %   Eosinophils Relative 3.4 0.0 - 5.0 %   Basophils Relative 0.9 0.0 - 3.0 %   Neutro Abs 3.7 1.4 - 7.7 K/uL   Lymphs Abs 1.8 0.7 - 4.0 K/uL   Monocytes Absolute 0.9 0.1 - 1.0 K/uL   Eosinophils Absolute 0.2 0.0 - 0.7 K/uL   Basophils Absolute 0.1 0.0 - 0.1 K/uL   Assessment/Plan: 1. Episodic low back pain Exam unremarkable today. Giving location and description of pain seems clearly related to perispinous musculature of L lumbar region. Supportive measures reviewed. Handout given. Referral to PT placed for further assessment and management so that we can help to prevent these episodes. No indication for imaging at present as this seems clearly muscular.  - Ambulatory referral to  Physical Therapy    Piedad ClimesWilliam Cody Kolten Ryback, PA-C

## 2018-11-26 NOTE — Patient Instructions (Signed)
Try to avoid heavy lifting when possible.  For now, bend more at the knees to reach down and grab things so this will lessen strain on some of the muscle groups in the lower back. Apply Biofreeze to the lower back a few times per day. Recommend heating pad for 15 minutes at night before bed.  I am setting you up with Physical Therapy for a detailed assessment and regimen.

## 2018-12-29 ENCOUNTER — Other Ambulatory Visit: Payer: Self-pay

## 2018-12-29 ENCOUNTER — Telehealth: Payer: Self-pay

## 2018-12-29 DIAGNOSIS — R1033 Periumbilical pain: Secondary | ICD-10-CM

## 2018-12-29 NOTE — Telephone Encounter (Signed)
Pt called returning your call 

## 2018-12-29 NOTE — Telephone Encounter (Signed)
Pt returned your call, pls call him again. °

## 2018-12-29 NOTE — Telephone Encounter (Signed)
Orders in for CBC,CMET,Amylase, Lipase and KUB (2 view). Called patient and left message to please call back

## 2018-12-30 ENCOUNTER — Telehealth: Payer: Self-pay

## 2018-12-30 NOTE — Telephone Encounter (Signed)
Spoke to patient and he states he found something stuck in his belly button and when he removed it the pain went away. Said he could still  Come in for the labs but didn't feel he needed the KUB now. Michela Pitcher he can not come in for the labs for 2 weeks though, because of work

## 2018-12-30 NOTE — Telephone Encounter (Signed)
See phone note

## 2019-01-07 ENCOUNTER — Other Ambulatory Visit (INDEPENDENT_AMBULATORY_CARE_PROVIDER_SITE_OTHER): Payer: Managed Care, Other (non HMO)

## 2019-01-07 DIAGNOSIS — R1033 Periumbilical pain: Secondary | ICD-10-CM

## 2019-01-07 LAB — COMPREHENSIVE METABOLIC PANEL
ALT: 33 U/L (ref 0–53)
AST: 22 U/L (ref 0–37)
Albumin: 4.6 g/dL (ref 3.5–5.2)
Alkaline Phosphatase: 88 U/L (ref 39–117)
BUN: 12 mg/dL (ref 6–23)
CO2: 28 mEq/L (ref 19–32)
Calcium: 9.4 mg/dL (ref 8.4–10.5)
Chloride: 103 mEq/L (ref 96–112)
Creatinine, Ser: 0.95 mg/dL (ref 0.40–1.50)
GFR: 89.11 mL/min (ref >60.00)
Glucose, Bld: 97 mg/dL (ref 70–99)
Potassium: 4.3 mEq/L (ref 3.5–5.1)
Sodium: 139 mEq/L (ref 135–145)
Total Bilirubin: 1.1 mg/dL (ref 0.2–1.2)
Total Protein: 7.3 g/dL (ref 6.0–8.3)

## 2019-01-07 LAB — CBC WITH DIFFERENTIAL/PLATELET
Basophils Absolute: 0.1 10*3/uL (ref 0.0–0.1)
Basophils Relative: 1.2 % (ref 0.0–3.0)
Eosinophils Absolute: 0.2 10*3/uL (ref 0.0–0.7)
Eosinophils Relative: 3.2 % (ref 0.0–5.0)
HCT: 46 % (ref 39.0–52.0)
Hemoglobin: 15.8 g/dL (ref 13.0–17.0)
Lymphocytes Relative: 27.9 % (ref 12.0–46.0)
Lymphs Abs: 2.1 10*3/uL (ref 0.7–4.0)
MCHC: 34.4 g/dL (ref 30.0–36.0)
MCV: 86.8 fl (ref 78.0–100.0)
Monocytes Absolute: 0.9 10*3/uL (ref 0.1–1.0)
Monocytes Relative: 11.8 % (ref 3.0–12.0)
Neutro Abs: 4.3 10*3/uL (ref 1.4–7.7)
Neutrophils Relative %: 55.9 % (ref 43.0–77.0)
Platelets: 254 10*3/uL (ref 150.0–400.0)
RBC: 5.29 Mil/uL (ref 4.22–5.81)
RDW: 13.9 % (ref 11.5–15.5)
WBC: 7.6 10*3/uL (ref 4.0–10.5)

## 2019-01-07 LAB — LIPASE: Lipase: 65 U/L — ABNORMAL HIGH (ref 11.0–59.0)

## 2019-01-07 LAB — AMYLASE: Amylase: 48 U/L (ref 27–131)

## 2019-04-13 ENCOUNTER — Telehealth: Payer: Self-pay | Admitting: *Deleted

## 2019-04-13 ENCOUNTER — Ambulatory Visit (INDEPENDENT_AMBULATORY_CARE_PROVIDER_SITE_OTHER): Payer: Managed Care, Other (non HMO) | Admitting: Physician Assistant

## 2019-04-13 ENCOUNTER — Other Ambulatory Visit: Payer: Self-pay

## 2019-04-13 ENCOUNTER — Ambulatory Visit (INDEPENDENT_AMBULATORY_CARE_PROVIDER_SITE_OTHER): Payer: Managed Care, Other (non HMO)

## 2019-04-13 ENCOUNTER — Encounter: Payer: Self-pay | Admitting: Physician Assistant

## 2019-04-13 DIAGNOSIS — J0101 Acute recurrent maxillary sinusitis: Secondary | ICD-10-CM

## 2019-04-13 DIAGNOSIS — K219 Gastro-esophageal reflux disease without esophagitis: Secondary | ICD-10-CM

## 2019-04-13 DIAGNOSIS — H60392 Other infective otitis externa, left ear: Secondary | ICD-10-CM

## 2019-04-13 MED ORDER — DOXYCYCLINE HYCLATE 100 MG PO CAPS: 100.0000 mg | ORAL_CAPSULE | Freq: Two times a day (BID) | ORAL | 0 refills | Status: DC

## 2019-04-13 MED ORDER — OMEPRAZOLE 20 MG PO CPDR: 20.0000 mg | DELAYED_RELEASE_CAPSULE | Freq: Two times a day (BID) | ORAL | 1 refills | Status: AC

## 2019-04-13 MED ORDER — NEOMYCIN-POLYMYXIN-HC 3.5-10000-1 OT SOLN: 3.0000 [drp] | Freq: Four times a day (QID) | OTIC | 0 refills | Status: DC

## 2019-04-13 MED ORDER — PANTOPRAZOLE SODIUM 40 MG PO TBEC: 40.0000 mg | DELAYED_RELEASE_TABLET | Freq: Every day | ORAL | 0 refills | Status: DC

## 2019-04-13 NOTE — Telephone Encounter (Signed)
Pharmacy requesting changes on Rx Protonix  Stated insurance cover Rx Omeprozole 20mg  [phone]  Is this ok to change Rx?

## 2019-04-13 NOTE — Telephone Encounter (Signed)
I have sent in new Rx for the Omeprazole 20 mg BID. Quantity 60 (1 months supply) with a refill.

## 2019-04-13 NOTE — Progress Notes (Signed)
Virtual Visit via Video   I connected with patient on 04/13/19 at 11:00 AM EST by a video enabled telemedicine application and verified that I am speaking with the correct person using two identifiers.  Location patient: Home Location provider: [facility] Persons participating in the virtual visit: Patient, Provider, CMA (Patina Moore)  I discussed the limitations of evaluation and management by telemedicine and the availability of in person appointments. The patient expressed understanding and agreed to proceed.  Subjective:   HPI:   Patient presents via [facility] c/o continued pain of L ear described as aching/throbbing, worse with pulling on the ear or trying to put in ear buds. Associated symptoms include left-sided maxillary sinus pain, sore throat, fatigue. Patient endorses being seen at Urgent Care on 03/19/2019 for this. Was diagnosed with ear infection and sinusitis, placed on Augmentin x 7 days. Better but did not fully resolve. After 4-5 days recurred and has been constant since then.   Also having increase in indigestion and recurrence of heart burn during this. Has history of GERD, gastritis and gastric/duodenal ulcers. Not taking PPI. Notes recent recurrence of BRBPR. Has not contacted GI. Again noted fatigue without shortness of breath.   ROS:   See pertinent positives and negatives per HPI.  Patient Active Problem List   Diagnosis Date Noted  . Gastritis and gastroduodenitis 11/07/2018  . Anemia due to blood loss 11/07/2018  . Right lower quadrant abdominal pain 11/07/2018  . Duodenal ulcer   . Gastric ulcer   . Hiatal hernia   . Upper GI bleed 05/16/2018  . Sinusitis, acute 06/24/2014  . Health maintenance alteration 11/30/2012  . Alcohol dependence ([facility]) 09/02/2012  . 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 Generalized anxiety disorder 08/27/2012  . HTN (hypertension) 10/09/2011  . DEPRESSION, MAJOR, SEVERE  09/13/2009  . MIGRAINE HEADACHE 04/10/2007    Social History   Tobacco Use  . Smoking status: Former Smoker    Types: Cigarettes  . Smokeless tobacco: Former Neurosurgeon    Types: Chew    Quit date: 05/20/2018  . Tobacco comment: quit 2014  Substance Use Topics  . Alcohol use: No    Comment: recovering alcoholic -- 68 months sober.     Current Outpatient Medications:  .  ferrous gluconate (FERGON) 324 MG tablet, Take 1 tablet (324 mg total) by mouth daily with breakfast. (Patient not taking: Reported on 04/13/2019), Disp: 30 tablet, Rfl: 3 .  pantoprazole (PROTONIX) 40 MG tablet, Take 1 tablet (40 mg total) by mouth daily. (Patient not taking: Reported on 04/13/2019), Disp: 90 tablet, Rfl: 3  Allergies  Allergen Reactions  . Other Other (See Comments)    NARCOTICS. 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  . Codeine Nausea And Vomiting    Objective:   There were no vitals taken for this visit.  Patient is well-developed, well-nourished in no acute distress.  Resting comfortably at home.  Head is normocephalic, atraumatic.  No labored breathing.  Speech is clear and coherent with logical contest.  Patient is alert and oriented at baseline.  Pain with moving L ear lobe or pressing on tragus  Assessment and Plan:   1. Other infective acute otitis externa of left ear Start Cortisporin otic solution.  Supportive measures and OTC medications reviewed.  Follow-up if not resolving. - neomycin-polymyxin-hydrocortisone (CORTISPORIN) OTIC solution; Place 3 drops into the left ear 4 (four) times daily. For 5-7 days  Dispense: 10 mL; Refill: 0  2.  Acute recurrent maxillary sinusitis Recently on Augmentin.  Will start course of doxycycline.  Restart Flonase.  Supportive measures and OTC medications reviewed. - doxycycline (VIBRAMYCIN) 100 MG capsule; Take 1 capsule (100 mg total) by mouth 2 (two) times daily.  Dispense: 14 capsule; Refill: 0  3. Gastroesophageal reflux disease  without esophagitis Recurrent.  Is not taking PPI.  No current follow-up with GI.  Some recurrence of bleeding without noted melena.  Is no longer on iron supplement as well.  Will have patient come in to check blood count.  Restart Protonix daily as directed.  Patient is to contact his GI provider for follow-up.    Leeanne Rio, PA-C 04/13/2019

## 2019-04-13 NOTE — Progress Notes (Signed)
I have discussed the procedure for the virtual visit with the patient who has given consent to proceed with assessment and treatment.   Rutherford Alarie S Porschea Borys, CMA     

## 2019-04-14 LAB — CBC WITH DIFFERENTIAL/PLATELET
Basophils Absolute: 0.1 10*3/uL (ref 0.0–0.1)
Basophils Relative: 1.1 % (ref 0.0–3.0)
Eosinophils Absolute: 0.2 10*3/uL (ref 0.0–0.7)
Eosinophils Relative: 2.4 % (ref 0.0–5.0)
HCT: 46.8 % (ref 39.0–52.0)
Hemoglobin: 15.9 g/dL (ref 13.0–17.0)
Lymphocytes Relative: 20.8 % (ref 12.0–46.0)
Lymphs Abs: 1.5 10*3/uL (ref 0.7–4.0)
MCHC: 34.1 g/dL (ref 30.0–36.0)
MCV: 86.4 fl (ref 78.0–100.0)
Monocytes Absolute: 0.9 10*3/uL (ref 0.1–1.0)
Monocytes Relative: 12.8 % — ABNORMAL HIGH (ref 3.0–12.0)
Neutro Abs: 4.6 10*3/uL (ref 1.4–7.7)
Neutrophils Relative %: 62.9 % (ref 43.0–77.0)
Platelets: 239 10*3/uL (ref 150.0–400.0)
RBC: 5.42 Mil/uL (ref 4.22–5.81)
RDW: 13.5 % (ref 11.5–15.5)
WBC: 7.3 10*3/uL (ref 4.0–10.5)

## 2019-06-01 ENCOUNTER — Other Ambulatory Visit: Payer: Self-pay | Admitting: Physician Assistant

## 2019-11-11 ENCOUNTER — Other Ambulatory Visit: Payer: Self-pay | Admitting: Physician Assistant

## 2019-11-23 ENCOUNTER — Ambulatory Visit (INDEPENDENT_AMBULATORY_CARE_PROVIDER_SITE_OTHER): Payer: 59 | Admitting: Physician Assistant

## 2019-11-23 ENCOUNTER — Encounter: Payer: Self-pay | Admitting: Physician Assistant

## 2019-11-23 ENCOUNTER — Other Ambulatory Visit: Payer: Self-pay

## 2019-11-23 VITALS — BP 120/82 | HR 57 | Temp 97.3°F | Resp 16 | Ht 69.0 in | Wt 172.0 lb

## 2019-11-23 DIAGNOSIS — Z0000 Encounter for general adult medical examination without abnormal findings: Secondary | ICD-10-CM | POA: Diagnosis not present

## 2019-11-23 DIAGNOSIS — K449 Diaphragmatic hernia without obstruction or gangrene: Secondary | ICD-10-CM | POA: Diagnosis not present

## 2019-11-23 DIAGNOSIS — R5382 Chronic fatigue, unspecified: Secondary | ICD-10-CM | POA: Diagnosis not present

## 2019-11-23 LAB — CBC WITH DIFFERENTIAL/PLATELET
Basophils Absolute: 0 10*3/uL (ref 0.0–0.1)
Basophils Relative: 0.5 % (ref 0.0–3.0)
Eosinophils Absolute: 0.2 10*3/uL (ref 0.0–0.7)
Eosinophils Relative: 2.3 % (ref 0.0–5.0)
HCT: 44.4 % (ref 39.0–52.0)
Hemoglobin: 15.2 g/dL (ref 13.0–17.0)
Lymphocytes Relative: 28.8 % (ref 12.0–46.0)
Lymphs Abs: 1.9 10*3/uL (ref 0.7–4.0)
MCHC: 34.3 g/dL (ref 30.0–36.0)
MCV: 85.1 fl (ref 78.0–100.0)
Monocytes Absolute: 1 10*3/uL (ref 0.1–1.0)
Monocytes Relative: 14.9 % — ABNORMAL HIGH (ref 3.0–12.0)
Neutro Abs: 3.5 10*3/uL (ref 1.4–7.7)
Neutrophils Relative %: 53.5 % (ref 43.0–77.0)
Platelets: 251 10*3/uL (ref 150.0–400.0)
RBC: 5.22 Mil/uL (ref 4.22–5.81)
RDW: 13.5 % (ref 11.5–15.5)
WBC: 6.5 10*3/uL (ref 4.0–10.5)

## 2019-11-23 LAB — COMPREHENSIVE METABOLIC PANEL
ALT: 38 U/L (ref 0–53)
AST: 24 U/L (ref 0–37)
Albumin: 4.5 g/dL (ref 3.5–5.2)
Alkaline Phosphatase: 118 U/L — ABNORMAL HIGH (ref 39–117)
BUN: 8 mg/dL (ref 6–23)
CO2: 29 mEq/L (ref 19–32)
Calcium: 9.4 mg/dL (ref 8.4–10.5)
Chloride: 103 mEq/L (ref 96–112)
Creatinine, Ser: 0.86 mg/dL (ref 0.40–1.50)
GFR: 99.48 mL/min (ref >60.00)
Glucose, Bld: 83 mg/dL (ref 70–99)
Potassium: 4 mEq/L (ref 3.5–5.1)
Sodium: 139 mEq/L (ref 135–145)
Total Bilirubin: 0.9 mg/dL (ref 0.2–1.2)
Total Protein: 7 g/dL (ref 6.0–8.3)

## 2019-11-23 LAB — LIPID PANEL
Cholesterol: 161 mg/dL (ref 0–200)
HDL: 30.9 mg/dL — ABNORMAL LOW (ref >39.00)
LDL Cholesterol: 98 mg/dL (ref 0–99)
NonHDL: 129.62
Total CHOL/HDL Ratio: 5
Triglycerides: 160 mg/dL — ABNORMAL HIGH (ref 0.0–149.0)
VLDL: 32 mg/dL (ref 0.0–40.0)

## 2019-11-23 LAB — TSH: TSH: 1.23 u[IU]/mL (ref 0.35–4.50)

## 2019-11-23 LAB — VITAMIN D 25 HYDROXY (VIT D DEFICIENCY, FRACTURES): VITD: 29.32 ng/mL — ABNORMAL LOW (ref 30.00–100.00)

## 2019-11-23 LAB — HEMOGLOBIN A1C: Hgb A1c MFr Bld: 5.8 % (ref 4.6–6.5)

## 2019-11-23 NOTE — Patient Instructions (Signed)
Please go to the lab for blood work.   Our office will call you with your results unless you have chosen to receive results via MyChart.  If your blood work is normal we will follow-up each year for physicals and as scheduled for chronic medical problems.  If anything is abnormal we will treat accordingly and get you in for a follow-up.  I want you to go ahead and schedule a follow-up with your Gastroenterologist. I can always set you up with a different practice if you prefer.    Preventive Care 78-44 Years Old, Male Preventive care refers to lifestyle choices and visits with your health care provider that can promote health and wellness. This includes:  A yearly physical exam. This is also called an annual well check.  Regular dental and eye exams.  Immunizations.  Screening for certain conditions.  Healthy lifestyle choices, such as eating a healthy diet, getting regular exercise, not using drugs or products that contain nicotine and tobacco, and limiting alcohol use. What can I expect for my preventive care visit? Physical exam Your health care provider will check:  Height and weight. These may be used to calculate body mass index (BMI), which is a measurement that tells if you are at a healthy weight.  Heart rate and blood pressure.  Your skin for abnormal spots. Counseling Your health care provider may ask you questions about:  Alcohol, tobacco, and drug use.  Emotional well-being.  Home and relationship well-being.  Sexual activity.  Eating habits.  Work and work Astronomer. What immunizations do I need?  Influenza (flu) vaccine  This is recommended every year. Tetanus, diphtheria, and pertussis (Tdap) vaccine  You may need a Td booster every 10 years. Varicella (chickenpox) vaccine  You may need this vaccine if you have not already been vaccinated. Human papillomavirus (HPV) vaccine  If recommended by your health care provider, you may need three  doses over 6 months. Measles, mumps, and rubella (MMR) vaccine  You may need at least one dose of MMR. You may also need a second dose. Meningococcal conjugate (MenACWY) vaccine  One dose is recommended if you are 25-61 years old and a Orthoptist living in a residence hall, or if you have one of several medical conditions. You may also need additional booster doses. Pneumococcal conjugate (PCV13) vaccine  You may need this if you have certain conditions and were not previously vaccinated. Pneumococcal polysaccharide (PPSV23) vaccine  You may need one or two doses if you smoke cigarettes or if you have certain conditions. Hepatitis A vaccine  You may need this if you have certain conditions or if you travel or work in places where you may be exposed to hepatitis A. Hepatitis B vaccine  You may need this if you have certain conditions or if you travel or work in places where you may be exposed to hepatitis B. Haemophilus influenzae type b (Hib) vaccine  You may need this if you have certain risk factors. You may receive vaccines as individual doses or as more than one vaccine together in one shot (combination vaccines). Talk with your health care provider about the risks and benefits of combination vaccines. What tests do I need? Blood tests  Lipid and cholesterol levels. These may be checked every 5 years starting at age 60.  Hepatitis C test.  Hepatitis B test. Screening   Diabetes screening. This is done by checking your blood sugar (glucose) after you have not eaten for a while (fasting).  Sexually transmitted disease (STD) testing. Talk with your health care provider about your test results, treatment options, and if necessary, the need for more tests. Follow these instructions at home: Eating and drinking   Eat a diet that includes fresh fruits and vegetables, whole grains, lean protein, and low-fat dairy products.  Take vitamin and mineral supplements  as recommended by your health care provider.  Do not drink alcohol if your health care provider tells you not to drink.  If you drink alcohol: ? Limit how much you have to 0-2 drinks a day. ? Be aware of how much alcohol is in your drink. In the [place] one drink equals one 12 oz bottle of beer (355 mL), one 5 oz glass of wine (148 mL), or one 1 oz glass of hard liquor (44 mL). Lifestyle  Take daily care of your teeth and gums.  Stay active. Exercise for at least 30 minutes on 5 or more days each week.  Do not use any products that contain nicotine or tobacco, such as cigarettes, e-cigarettes, and chewing tobacco. If you need help quitting, ask your health care provider.  If you are sexually active, practice safe sex. Use a condom or other form of protection to prevent STIs (sexually transmitted infections). What's next?  Go to your health care provider once a year for a well check visit.  Ask your health care provider how often you should have your eyes and teeth checked.  Stay up to date on all vaccines. This information is not intended to replace advice given to you by your health care provider. Make sure you discuss any questions you have with your health care provider. Document Revised: 03/04/2018 Document Reviewed: 03/04/2018 Elsevier Patient Education  2020 [employer].

## 2019-11-23 NOTE — Progress Notes (Signed)
Patient presents to clinic today for annual exam.  Patient is fasting for labs.  Diet -- Endorses diet is overall well-balanced but is picky about fruits and vegetables. Has been very routine due to history of GERD/ulceration. Is trying to keep well-hydrated but notes he can do better.   Exercise -- Very active, working 65 hours per week. No exercise outside of work.   Health Maintenance: Immunizations --tetanus up-to-date.  Past Medical History:  Diagnosis Date  . Anxiety   . DEPRESSION, MAJOR, SEVERE 09/13/2009   Qualifier: Diagnosis of  By: Debby Bud MD, Rosalyn Gess   . Duodenal ulcer   . Gastric ulcer   . Hiatal hernia   . History of alcohol abuse   . HTN (hypertension) 10/09/2011  . MIGRAINE HEADACHE 04/10/2007   Qualifier: Diagnosis of  By: Genelle Gather CMA, [place]    . Substance abuse [facility])     Past Surgical History:  Procedure Laterality Date  . BIOPSY  05/17/2018   Procedure: BIOPSY;  Surgeon: Meridee Score Netty Starring., MD;  Location: [facility] ENDOSCOPY;  Service: Gastroenterology;;  . ESOPHAGOGASTRODUODENOSCOPY (EGD) WITH PROPOFOL N/A 05/17/2018   Procedure: ESOPHAGOGASTRODUODENOSCOPY (EGD) WITH PROPOFOL;  Surgeon: Lemar Lofty., MD;  Location: [facility] ENDOSCOPY;  Service: Gastroenterology;  Laterality: N/A;  . finger surgury    . ORIF ANKLE FRACTURE Right   . WISDOM TOOTH EXTRACTION      Current Outpatient Medications on File Prior to Visit  Medication Sig Dispense Refill  . omeprazole (PRILOSEC) 20 MG capsule Take 1 capsule (20 mg total) by mouth 2 (two) times daily before a meal. 60 capsule 1   No current facility-administered medications on file prior to visit.    Allergies  Allergen Reactions  . Other Other (See Comments)    NARCOTICS. 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  . Codeine Nausea And Vomiting    Family History  Problem Relation Age of Onset  . Hypertension Mother   . Hypertension Father   . Alcohol abuse Father   . Alcohol abuse  Paternal Aunt   . Alcohol abuse Paternal Uncle   . Alcohol abuse Paternal Grandfather   . Schizophrenia Paternal Grandfather   . Alcohol abuse Paternal Grandmother   . Colon cancer Maternal Grandfather   . Esophageal cancer Neg Hx   . Pancreatic cancer Neg Hx   . Stomach cancer Neg Hx   . Inflammatory bowel disease Neg Hx     Social History   Socioeconomic History  . Marital status: Married    Spouse name: Not on file  . Number of children: 1  . Years of education: 90  . Highest education level: Not on file  Occupational History  . Occupation: Chiropodist: [employer]  Tobacco Use  . Smoking status: Former Smoker    Types: Cigarettes  . Smokeless tobacco: Former Neurosurgeon    Types: Chew    Quit date: 05/20/2018  . Tobacco comment: quit 2014  Vaping Use  . Vaping Use: Never used  Substance and Sexual Activity  . Alcohol use: No    Comment: recovering alcoholic -- 68 months sober.   . Drug use: No    Frequency: 30.0 times per week    Types: Benzodiazepines    Comment: previous benzo addiction  . Sexual activity: Yes    Partners: Female  Other Topics Concern  . Not on file  Social History Narrative   HSG, Scientist, product/process development college. Married -'11(?). 1 dtr - '12. Work -  machinist.      09/03/2012 [name] was born and grew up in [place], [place]. He has a younger sister. His parents are still together and healthy. He graduated from high school, and has all but a degree at [employer] in Musician. 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 He has been married for 5 years, and has an 57-month-old daughter. He currently lives with his wife and daughter. 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 His social support system consists of his sponsor, wife, mother, father, sister, brother-in-law, and friends. He affiliates as a International aid/development worker. He denies any legal problems. He enjoys golf and firearms. 09/03/2012 AHW    Social Determinants of Health   Financial Resource Strain:   . Difficulty of Paying Living Expenses: Not on file  Food Insecurity:   . Worried About Programme researcher, broadcasting/film/video in the Last Year: Not on file  . Ran Out of Food in the Last Year: Not on file  Transportation Needs:   . Lack of Transportation (Medical): Not on file  . Lack of Transportation (Non-Medical): Not on file  Physical Activity:   . Days of Exercise per Week: Not on file  . Minutes of Exercise per Session: Not on file  Stress:   . Feeling of Stress : Not on file  Social Connections:   . Frequency of Communication with Friends and Family: Not on file  . Frequency of Social Gatherings with Friends and Family: Not on file  . Attends Religious Services: Not on file  . Active Member of Clubs or Organizations: Not on file  . Attends Banker Meetings: Not on file  . Marital Status: Not on file  Intimate Partner Violence:   . Fear of Current or Ex-Partner: Not on file  . Emotionally Abused: Not on file  . Physically Abused: Not on file  . Sexually Abused: Not on file   Review of Systems  Constitutional: Positive for malaise/fatigue. Negative for fever and weight loss.  HENT: Negative for ear discharge, ear pain, hearing loss and tinnitus.   Eyes: Negative for blurred vision, double vision, photophobia and pain.  Respiratory: Negative for cough and shortness of breath.   Cardiovascular: Negative for chest pain and palpitations.  Gastrointestinal: Positive for heartburn. Negative for abdominal pain, blood in stool, constipation, diarrhea, melena, nausea and vomiting.  Genitourinary: Negative for dysuria, flank pain, frequency, hematuria and urgency.  Musculoskeletal: Negative for falls.  Neurological: Negative for dizziness, loss of consciousness and headaches.  Endo/Heme/Allergies: Negative for environmental allergies.  Psychiatric/Behavioral: Negative for depression, hallucinations, substance abuse and  suicidal ideas. The patient is not nervous/anxious and does not have insomnia.    BP 120/82   Pulse (!) 57   Temp (!) 97.3 F (36.3 C) (Temporal)   Resp 16   Ht 5\' 9"  (1.753 m)   Wt 172 lb (78 kg)   SpO2 98%   BMI 25.40 kg/m   Physical Exam Vitals reviewed.  Constitutional:      General: He is not in acute distress.    Appearance: He is well-developed. He is not diaphoretic.  HENT:     Head: Normocephalic and atraumatic.     Right Ear: Tympanic membrane, ear canal and external ear normal.     Left Ear: Tympanic membrane, ear canal and external ear normal.     Nose: Nose normal.     Mouth/Throat:     Pharynx: No  posterior oropharyngeal erythema.  Eyes:     Conjunctiva/sclera: Conjunctivae normal.     Pupils: Pupils are equal, round, and reactive to light.  Neck:     Thyroid: No thyromegaly.  Cardiovascular:     Rate and Rhythm: Normal rate and regular rhythm.     Heart sounds: Normal heart sounds.  Pulmonary:     Effort: Pulmonary effort is normal. No respiratory distress.     Breath sounds: Normal breath sounds. No wheezing or rales.  Chest:     Chest wall: No tenderness.  Abdominal:     General: Bowel sounds are normal. There is no distension.     Palpations: Abdomen is soft. There is no mass.     Tenderness: There is no abdominal tenderness. There is no guarding or rebound.  Musculoskeletal:     Cervical back: Neck supple.  Lymphadenopathy:     Cervical: No cervical adenopathy.  Skin:    General: Skin is warm and dry.     Findings: No rash.  Neurological:     Mental Status: He is alert and oriented to person, place, and time.     Cranial Nerves: No cranial nerve deficit.     Assessment/Plan: 1. Visit for preventive health examination Depression screen negative. Health Maintenance reviewed. Preventive schedule discussed and handout given in AVS. Will obtain fasting labs today.  - CBC with Differential/Platelet - Comprehensive metabolic panel - Lipid  panel - Hemoglobin A1c  2. Chronic fatigue Mentioned during ROS. Will check labs to include CBC, TSH and Vit D. Mood stable. Dietary and exercise recommendations reviewed. - CBC with Differential/Platelet - TSH - Vitamin D (25 hydroxy)  3. Hiatal hernia Ongoing symptoms. Controlled with PPI BID. He is encouraged to schedule follow-up with GI as this is overdue.     This visit occurred during the SARS-CoV-2 public health emergency.  Safety protocols were in place, including screening questions prior to the visit, additional usage of staff PPE, and extensive cleaning of exam room while observing appropriate contact time as indicated for disinfecting solutions.    Piedad Climes, PA-C

## 2019-11-24 ENCOUNTER — Other Ambulatory Visit: Payer: Self-pay

## 2019-11-24 DIAGNOSIS — R5382 Chronic fatigue, unspecified: Secondary | ICD-10-CM

## 2019-11-24 MED ORDER — VITAMIN D (ERGOCALCIFEROL) 1.25 MG (50000 UNIT) PO CAPS: 50000.0000 [IU] | ORAL_CAPSULE | ORAL | 0 refills | Status: AC

## 2019-11-24 NOTE — Progress Notes (Signed)
tam

## 2020-02-29 ENCOUNTER — Ambulatory Visit (INDEPENDENT_AMBULATORY_CARE_PROVIDER_SITE_OTHER): Payer: 59

## 2020-02-29 ENCOUNTER — Other Ambulatory Visit: Payer: Self-pay

## 2020-02-29 DIAGNOSIS — R5382 Chronic fatigue, unspecified: Secondary | ICD-10-CM | POA: Diagnosis not present

## 2020-02-29 LAB — VITAMIN D 25 HYDROXY (VIT D DEFICIENCY, FRACTURES): VITD: 66.64 ng/mL (ref 30.00–100.00)

## 2020-03-09 ENCOUNTER — Telehealth (INDEPENDENT_AMBULATORY_CARE_PROVIDER_SITE_OTHER): Payer: 59 | Admitting: Physician Assistant

## 2020-03-09 ENCOUNTER — Other Ambulatory Visit: Payer: Self-pay

## 2020-03-09 ENCOUNTER — Encounter: Payer: Self-pay | Admitting: Physician Assistant

## 2020-03-09 DIAGNOSIS — J0100 Acute maxillary sinusitis, unspecified: Secondary | ICD-10-CM | POA: Diagnosis not present

## 2020-03-09 MED ORDER — AMOXICILLIN-POT CLAVULANATE 875-125 MG PO TABS: 1.0000 | ORAL_TABLET | Freq: Two times a day (BID) | ORAL | 0 refills | Status: DC

## 2020-03-09 NOTE — Progress Notes (Signed)
I have discussed the procedure for the virtual visit with the patient who has given consent to proceed with assessment and treatment.   Wah Sabic S Dontea Corlew, CMA     

## 2020-03-09 NOTE — Progress Notes (Signed)
   Virtual Visit via Video   I connected with patient on 03/09/20 at  3:30 PM EST by a video enabled telemedicine application and verified that I am speaking with the correct person using two identifiers.  Location patient: Home Location provider: [place] [facility], Office Persons participating in the virtual visit: Patient, Provider, CMA (Patina Moore)  I discussed the limitations of evaluation and management by telemedicine and the availability of in person appointments. The patient expressed understanding and agreed to proceed.  Subjective:   HPI:  Patient presents via Careg[facility] today for 1/2 weeks of nasal congestion with sinus pressure, postnasal drip and mild headache.  Over the past 40 hours has been having more sinus pain and upper tooth pain bilaterally.  Denies fever, chills, aches, chest congestion, cough or shortness of breath.  Denies loss of taste or smell.  Denies recent travel or sick contact.  Has been increasing water intake and taking Tylenol Cold and sinus with some relief of symptoms.  Notes he is prone to sinus infection.  ROS:   See pertinent positives and negatives per HPI.  Patient Active Problem List   Diagnosis Date Noted  . Gastritis and gastroduodenitis 11/07/2018  . Anemia due to blood loss 11/07/2018  . Right lower quadrant abdominal pain 11/07/2018  . Duodenal ulcer   . Gastric ulcer   . Hiatal hernia   . Alcohol dependence (HCC) 09/02/2012  . 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 MIGRAINE HEADACHE 04/10/2007    Social History   Tobacco Use  . Smoking status: Former Smoker    Types: Cigarettes  . Smokeless tobacco: Former Neurosurgeon    Types: Chew    Quit date: 05/20/2018  . Tobacco comment: quit 2014  Substance Use Topics  . Alcohol use: No    Comment: recovering alcoholic -- 68 months sober.     Current Outpatient Medications:  .  omeprazole (PRILOSEC) 20 MG capsule, Take 1 capsule (20 mg total) by mouth 2  (two) times daily before a meal., Disp: 60 capsule, Rfl: 1  Allergies  Allergen Reactions  . Other Other (See Comments)    NARCOTICS. 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  . Codeine Nausea And Vomiting    Objective:   There were no vitals taken for this visit.  Patient is well-developed, well-nourished in no acute distress.  Resting comfortably at home.  Head is normocephalic, atraumatic.  No labored breathing.  Speech is clear and coherent with logical content.  Patient is alert and oriented at baseline.  + TTP maxillary sinuses.   Assessment and Plan:   1. Acute non-recurrent maxillary sinusitis Rx Augmentin.  Increase fluids.  Rest.  Saline nasal spray.  Probiotic.  Mucinex as directed.  Humidifier in bedroom.  Call or return to clinic if symptoms are not improving. Strict COVID testing precautions discussed with patient who voices understanding.   - amoxicillin-clavulanate (AUGMENTIN) 875-125 MG tablet; Take 1 tablet by mouth 2 (two) times daily.  Dispense: 14 tablet; Refill: 0   Piedad Climes, [place] 03/09/2020

## 2020-05-26 ENCOUNTER — Ambulatory Visit (HOSPITAL_COMMUNITY)
Admission: EM | Admit: 2020-05-26 | Discharge: 2020-05-26 | Disposition: A | Discharge status: Home / Self Care | Payer: 59 | Attending: Emergency Medicine | Admitting: Emergency Medicine

## 2020-05-26 ENCOUNTER — Other Ambulatory Visit: Payer: Self-pay

## 2020-05-26 ENCOUNTER — Encounter (HOSPITAL_COMMUNITY): Payer: Self-pay | Admitting: Emergency Medicine

## 2020-05-26 ENCOUNTER — Ambulatory Visit (INDEPENDENT_AMBULATORY_CARE_PROVIDER_SITE_OTHER): Payer: 59

## 2020-05-26 DIAGNOSIS — S93421A Sprain of deltoid ligament of right ankle, initial encounter: Secondary | ICD-10-CM

## 2020-05-26 DIAGNOSIS — M25571 Pain in right ankle and joints of right foot: Secondary | ICD-10-CM | POA: Diagnosis not present

## 2020-05-26 DIAGNOSIS — X501XXA Overexertion from prolonged static or awkward postures, initial encounter: Secondary | ICD-10-CM | POA: Diagnosis not present

## 2020-05-26 MED ORDER — DICLOFENAC SODIUM 1 % EX GEL: 1.0000 "application " | Freq: Four times a day (QID) | CUTANEOUS | 0 refills | Status: DC

## 2020-05-26 NOTE — Discharge Instructions (Addendum)
1000 mg of Tylenol 3-4 times a day as needed for pain.  He can get the Voltaren topical over-the-counter but I have written you prescription in case your insurance covers it.  Ice, elevate your ankle.  Follow-up with [facility] if not better in a week or 2.  You may need physical therapy.  Below is a list of primary care practices who are taking new patients for you to follow-up with.  [facility] internal medicine clinic Ground Floor - [facility], [address] [place], [place], [place] [address] [phone]  [facility] [address] [place], [place] [address] [phone]  [facility] [address] [place], [place] [address] [phone]  [facility] [phone] [address] [place] [address]  [facility]: [address] [place] [address]  [phone]  [facility]: [address] [place] [address]   [phone]  [facility]: [address] [place] [address]  [phone]  [facility] : [address] [place] [address] [phone]  [facility]: [address] [place] [address] [phone]  [facility]: [address] [place] [address] [phone]  Dr. Oneal Grout [address] [facility] [place] [address]  [phone]  Dr. Jackie Plum, [facility]. [address] [place], [place] [address]  [phone]  Go to www.goodrx.com to look up your medications. This will give you a list of where you can find your prescriptions at the most affordable prices. Or ask the pharmacist what the cash price is, or if they have any other discount programs available to  help make your medication more affordable. This can be less expensive than what you would pay with insurance.

## 2020-05-26 NOTE — ED Provider Notes (Signed)
HPI  SUBJECTIVE:  Trevor Mendez is a 40 y.o. male who presents with sharp, sore right medial ankle pain after twisting his ankle twice today while wearing boots.  He states that he rolled his ankle outward both times.  No swelling, bruising, distal numbness or tingling.  He states that his foot feels "fat".  He tried Tylenol with some improvement in symptoms.  He also iced and elevated his ankle with improvement.  Symptoms are worse with weightbearing, ankle movement and placing torque on it.  He has a past medical history of hypertension, is a recovering alcoholic, gastric and duodenal ulcers, and is status post right ankle surgery.  States that he had the tendons tightened secondary to repetitive ankle sprains.  PMD: None.   Past Medical History:  Diagnosis Date  . Anxiety   . DEPRESSION, MAJOR, SEVERE 09/13/2009   Qualifier: Diagnosis of  By: Debby Bud MD, Rosalyn Gess   . Duodenal ulcer   . Gastric ulcer   . Hiatal hernia   . History of alcohol abuse   . HTN (hypertension) 10/09/2011  . MIGRAINE HEADACHE 04/10/2007   Qualifier: Diagnosis of  By: Genelle Gather CMA, [place]    . Substance abuse [facility])     Past Surgical History:  Procedure Laterality Date  . BIOPSY  05/17/2018   Procedure: BIOPSY;  Surgeon: Meridee Score Netty Starring., MD;  Location: [facility] ENDOSCOPY;  Service: Gastroenterology;;  . ESOPHAGOGASTRODUODENOSCOPY (EGD) WITH PROPOFOL N/A 05/17/2018   Procedure: ESOPHAGOGASTRODUODENOSCOPY (EGD) WITH PROPOFOL;  Surgeon: Lemar Lofty., MD;  Location: [facility] ENDOSCOPY;  Service: Gastroenterology;  Laterality: N/A;  . finger surgury    . ORIF ANKLE FRACTURE Right   . WISDOM TOOTH EXTRACTION      Family History  Problem Relation Age of Onset  . Hypertension Mother   . Hypertension Father   . Alcohol abuse Father   . Alcohol abuse Paternal Aunt   . Alcohol abuse Paternal Uncle   . Alcohol abuse Paternal Grandfather   . Schizophrenia Paternal Grandfather   . Alcohol abuse Paternal  Grandmother   . Colon cancer Maternal Grandfather   . Esophageal cancer Neg Hx   . Pancreatic cancer Neg Hx   . Stomach cancer Neg Hx   . Inflammatory bowel disease Neg Hx     Social History   Tobacco Use  . Smoking status: Former Smoker    Types: Cigarettes  . Smokeless tobacco: Former Neurosurgeon    Types: Chew    Quit date: 05/20/2018  . Tobacco comment: quit 2014  Vaping Use  . Vaping Use: Never used  Substance Use Topics  . Alcohol use: No    Comment: recovering alcoholic -- 68 months sober.   . Drug use: No    Frequency: 30.0 times per week    Types: Benzodiazepines    Comment: previous benzo addiction    No current facility-administered medications for this encounter.  Current Outpatient Medications:  .  diclofenac Sodium (VOLTAREN) 1 % GEL, Apply 1 application topically 4 (four) times daily., Disp: 100 g, Rfl: 0 .  omeprazole (PRILOSEC) 20 MG capsule, Take 1 capsule (20 mg total) by mouth 2 (two) times daily before a meal., Disp: 60 capsule, Rfl: 1  Allergies  Allergen Reactions  . Other Other (See Comments)    NARCOTICS. 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  . Codeine Nausea And Vomiting     ROS  As noted in HPI.   Physical Exam  BP (!) 142/100 (BP Location:  Right Arm)   Pulse 73   Temp 97.8 F (36.6 C) (Oral)   Resp 17   SpO2 96%   Constitutional: Well developed, well nourished, no acute distress Eyes:  EOMI, conjunctiva normal bilaterally HENT: Normocephalic, atraumatic,mucus membranes moist Respiratory: Normal inspiratory effort Cardiovascular: Normal rate GI: nondistended skin: No rash, skin intact Musculoskeletal: R  Ankle  Proximal fibula NT  Distal fibula NT, Medial malleolus NT,  Deltoid ligaments tender,  ATFL NT, calcaneofibular ligament NT, posterior tablofibular ligament NT,  Achilles NT, calcaneus NT,  Proximal 5th metatarsal NT, Midfoot NT, distal NVI with baseline sensation / motor to foot with DP 2+.  Patient able to  move all toes pain  with plantar flexion.  No pain with dorsiflexion pain with inversion/eversion. - bruising.- squeeze test. Neurologic: Alert & oriented x 3, no focal neuro deficits Psychiatric: Speech and behavior appropriate   ED Course   Medications - No data to display  Orders Placed This Encounter  Procedures  . DG Ankle Complete Right    Standing Status:   Standing    Number of Occurrences:   1    Order Specific Question:   Reason for Exam (SYMPTOM  OR DIAGNOSIS REQUIRED)    Answer:   right ankle pain, injury  . Apply ASO ankle    Standing Status:   Standing    Number of Occurrences:   1    Order Specific Question:   Laterality    Answer:   Right    No results found for this or any previous visit (from the past 24 hour(s)). DG Ankle Complete Right  Result Date: 05/26/2020 CLINICAL DATA:  Right ankle pain after twisting injury. EXAM: RIGHT ANKLE - COMPLETE 3+ VIEW COMPARISON:  Ankle radiograph 03/05/2016 FINDINGS: There is no evidence of fracture, dislocation, or joint effusion. Ankle mortise is preserved. There is no evidence of arthropathy or other focal bone abnormality. Tiny plantar calcaneal spur. Soft tissues are unremarkable. IMPRESSION: No fracture or subluxation of the right ankle. Electronically Signed   By: Narda Rutherford M.D.   On: 05/26/2020 16:56    ED Clinical Impression  1. Sprain of deltoid ligament of right ankle, initial encounter      ED Assessment/Plan   Reviewed imaging independently.  Normal ankle.  See radiology report for full details.  Patient with right medial ankle sprain.  Will send home with an ASO right ankle, ice, elevation, Tylenol, topical Voltaren.  Patient is recovering alcoholic, so no narcotics.  No NSAIDs because of history of gastric and peptic ulcers.  Follow-up with PMD of choice, will order assistance in finding a PMD and also provide primary care list.  Follow-up with [facility] in a week or 2 if not better,  discussed that he may need physical therapy.  Discussed imaging, MDM, treatment plan, and plan for follow-up with patient. Discussed sn/sx that should prompt return to the ED. patient agrees with plan.   Meds ordered this encounter  Medications  . DISCONTD: diclofenac Sodium (VOLTAREN) 1 % GEL    Sig: Apply 1 application topically 4 (four) times daily.    Dispense:  100 g    Refill:  0  . diclofenac Sodium (VOLTAREN) 1 % GEL    Sig: Apply 1 application topically 4 (four) times daily.    Dispense:  100 g    Refill:  0    *This clinic note was created using Scientist, clinical (histocompatibility and immunogenetics). Therefore, there may be occasional mistakes despite careful proofreading.   ?  Domenick Gong, MD 05/26/20 1743

## 2020-05-26 NOTE — ED Triage Notes (Signed)
Pt states that he twisted his ankle and fell. Pt states that when he steps down he feels a sharp pain shooting through his ankle. Pt denies any swelling or discoloration

## 2020-05-31 ENCOUNTER — Emergency Department (HOSPITAL_COMMUNITY): Payer: 59

## 2020-05-31 ENCOUNTER — Other Ambulatory Visit: Payer: Self-pay

## 2020-05-31 ENCOUNTER — Emergency Department (HOSPITAL_COMMUNITY)
Admission: EM | Admit: 2020-05-31 | Discharge: 2020-05-31 | Disposition: A | Discharge status: Home / Self Care | Payer: 59 | Attending: Emergency Medicine | Admitting: Emergency Medicine

## 2020-05-31 ENCOUNTER — Encounter (HOSPITAL_COMMUNITY): Payer: Self-pay | Admitting: Emergency Medicine

## 2020-05-31 ENCOUNTER — Ambulatory Visit (HOSPITAL_COMMUNITY)
Admission: EM | Admit: 2020-05-31 | Discharge: 2020-05-31 | Disposition: A | Discharge status: Home / Self Care | Payer: 59

## 2020-05-31 ENCOUNTER — Encounter (HOSPITAL_COMMUNITY): Payer: Self-pay | Admitting: *Deleted

## 2020-05-31 DIAGNOSIS — Z87891 Personal history of nicotine dependence: Secondary | ICD-10-CM | POA: Diagnosis not present

## 2020-05-31 DIAGNOSIS — R630 Anorexia: Secondary | ICD-10-CM | POA: Insufficient documentation

## 2020-05-31 DIAGNOSIS — I10 Essential (primary) hypertension: Secondary | ICD-10-CM | POA: Diagnosis not present

## 2020-05-31 DIAGNOSIS — R1032 Left lower quadrant pain: Secondary | ICD-10-CM

## 2020-05-31 DIAGNOSIS — K659 Peritonitis, unspecified: Secondary | ICD-10-CM | POA: Diagnosis not present

## 2020-05-31 DIAGNOSIS — K6389 Other specified diseases of intestine: Secondary | ICD-10-CM

## 2020-05-31 LAB — CBC
HCT: 49.6 % (ref 39.0–52.0)
Hemoglobin: 16.6 g/dL (ref 13.0–17.0)
MCH: 29.2 pg (ref 26.0–34.0)
MCHC: 33.5 g/dL (ref 30.0–36.0)
MCV: 87.3 fL (ref 80.0–100.0)
Platelets: 294 10*3/uL (ref 150–400)
RBC: 5.68 MIL/uL (ref 4.22–5.81)
RDW: 13.2 % (ref 11.5–15.5)
WBC: 7.7 10*3/uL (ref 4.0–10.5)
nRBC: 0 % (ref 0.0–0.2)

## 2020-05-31 LAB — URINALYSIS, ROUTINE W REFLEX MICROSCOPIC
Bilirubin Urine: NEGATIVE
Glucose, UA: NEGATIVE mg/dL
Hgb urine dipstick: NEGATIVE
Ketones, ur: NEGATIVE mg/dL
Leukocytes,Ua: NEGATIVE
Nitrite: NEGATIVE
Protein, ur: NEGATIVE mg/dL
Specific Gravity, Urine: 1.005 (ref 1.005–1.030)
pH: 8 (ref 5.0–8.0)

## 2020-05-31 LAB — COMPREHENSIVE METABOLIC PANEL
ALT: 52 U/L — ABNORMAL HIGH (ref 0–44)
AST: 36 U/L (ref 15–41)
Albumin: 4.3 g/dL (ref 3.5–5.0)
Alkaline Phosphatase: 118 U/L (ref 38–126)
Anion gap: 7 (ref 5–15)
BUN: 8 mg/dL (ref 6–20)
CO2: 26 mmol/L (ref 22–32)
Calcium: 9.6 mg/dL (ref 8.9–10.3)
Chloride: 105 mmol/L (ref 98–111)
Creatinine, Ser: 1 mg/dL (ref 0.61–1.24)
GFR, Estimated: >60 mL/min (ref >60)
Glucose, Bld: 103 mg/dL — ABNORMAL HIGH (ref 70–99)
Potassium: 4.2 mmol/L (ref 3.5–5.1)
Sodium: 138 mmol/L (ref 135–145)
Total Bilirubin: 1.4 mg/dL — ABNORMAL HIGH (ref 0.3–1.2)
Total Protein: 7.3 g/dL (ref 6.5–8.1)

## 2020-05-31 LAB — LIPASE, BLOOD: Lipase: 69 U/L — ABNORMAL HIGH (ref 11–51)

## 2020-05-31 MED ORDER — ACETAMINOPHEN 500 MG PO TABS
1000.0000 mg | ORAL_TABLET | Freq: Once | ORAL | Status: AC
  Administered 2020-05-31: 1000 mg via ORAL
  Filled 2020-05-31: qty 2

## 2020-05-31 MED ORDER — IOHEXOL 300 MG/ML  SOLN
100.0000 mL | Freq: Once | INTRAMUSCULAR | Status: AC | PRN
  Administered 2020-05-31: 100 mL via INTRAVENOUS

## 2020-05-31 NOTE — ED Provider Notes (Signed)
[facility] EMERGENCY DEPARTMENT Provider Note   CSN: [id number] Arrival date & time: 05/31/20  5361     History Chief Complaint  Patient presents with  . Abdominal Pain    Trevor Mendez is a 40 y.o. male presenting for evaluation of abdominal pain.  Patient states yesterday he developed sudden onset left lower quadrant abdominal pain.  He states it is constantly dull with intermittent sharp pain.  Hurts more with movement and palpation. Its does not radiate.  He also reports a decreased appetite over the past few days.  No associated fevers, chills, nausea, vomiting, constipation.  He has had soft stools.  Last BM was this morning, no melena no hematochezia.  No urinary symptoms or testicular pain.  No history of similar.  No history of kidney stones.  No previous abdominal surgeries.  No new foods or sick contacts.  He takes omeprazole daily for history of PUD.  He has not taken anything for pain.  He does follow with lower GI, last EGD was in 2020.  No previous colonoscopies, no history of diverticulitis.   HPI     Past Medical History:  Diagnosis Date  . Anxiety   . DEPRESSION, MAJOR, SEVERE 09/13/2009   Qualifier: Diagnosis of  By: Debby Bud MD, Rosalyn Gess   . Duodenal ulcer   . Gastric ulcer   . Hiatal hernia   . History of alcohol abuse   . HTN (hypertension) 10/09/2011  . MIGRAINE HEADACHE 04/10/2007   Qualifier: Diagnosis of  By: Genelle Gather CMA, [place]    . Substance abuse [facility])     Patient Active Problem List   Diagnosis Date Noted  . Gastritis and gastroduodenitis 11/07/2018  . Anemia due to blood loss 11/07/2018  . Right lower quadrant abdominal pain 11/07/2018  . Duodenal ulcer   . Gastric ulcer   . Hiatal hernia   . Alcohol dependence (HCC) 09/02/2012  . 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 MIGRAINE HEADACHE 04/10/2007    Past Surgical History:  Procedure Laterality Date  . BIOPSY  05/17/2018    Procedure: BIOPSY;  Surgeon: Meridee Score Netty Starring., MD;  Location: [facility] ENDOSCOPY;  Service: Gastroenterology;;  . ESOPHAGOGASTRODUODENOSCOPY (EGD) WITH PROPOFOL N/A 05/17/2018   Procedure: ESOPHAGOGASTRODUODENOSCOPY (EGD) WITH PROPOFOL;  Surgeon: Lemar Lofty., MD;  Location: [facility] ENDOSCOPY;  Service: Gastroenterology;  Laterality: N/A;  . finger surgury    . ORIF ANKLE FRACTURE Right   . WISDOM TOOTH EXTRACTION         Family History  Problem Relation Age of Onset  . Hypertension Mother   . Hypertension Father   . Alcohol abuse Father   . Alcohol abuse Paternal Aunt   . Alcohol abuse Paternal Uncle   . Alcohol abuse Paternal Grandfather   . Schizophrenia Paternal Grandfather   . Alcohol abuse Paternal Grandmother   . Colon cancer Maternal Grandfather   . Esophageal cancer Neg Hx   . Pancreatic cancer Neg Hx   . Stomach cancer Neg Hx   . Inflammatory bowel disease Neg Hx     Social History   Tobacco Use  . Smoking status: Former Smoker    Types: Cigarettes  . Smokeless tobacco: Former Neurosurgeon    Types: Chew    Quit date: 05/20/2018  . Tobacco comment: quit 2014  Vaping Use  . Vaping Use: Never used  Substance Use Topics  . Alcohol use: No    Comment: recovering alcoholic -- 68 months sober.   Marland Kitchen  Drug use: No    Frequency: 30.0 times per week    Types: Benzodiazepines    Comment: previous benzo addiction    Home Medications Prior to Admission medications   Medication Sig Start Date End Date Taking? Authorizing Provider  diclofenac Sodium (VOLTAREN) 1 % GEL Apply 1 application topically 4 (four) times daily. 05/26/20   Domenick Gong, MD  omeprazole (PRILOSEC) 20 MG capsule Take 1 capsule (20 mg total) by mouth 2 (two) times daily before a meal. 04/13/19   Waldon Merl, PA-C    Allergies    Other and Codeine  Review of Systems   Review of Systems  Gastrointestinal: Positive for abdominal pain.  All other systems reviewed and are  negative.   Physical Exam Updated Vital Signs BP 139/84 (BP Location: Left Arm)   Pulse 60   Temp 98.5 F (36.9 C) (Oral)   Resp 20   Ht 5\' 9"  (1.753 m)   Wt 77.1 kg   SpO2 97%   BMI 25.10 kg/m   Physical Exam Vitals and nursing note reviewed.  Constitutional:      General: He is not in acute distress.    Appearance: He is well-developed.     Comments: Resting in the bed in no acute distress  HENT:     Head: Normocephalic and atraumatic.  Eyes:     Conjunctiva/sclera: Conjunctivae normal.     Pupils: Pupils are equal, round, and reactive to light.  Cardiovascular:     Rate and Rhythm: Normal rate and regular rhythm.     Pulses: Normal pulses.  Pulmonary:     Effort: Pulmonary effort is normal. No respiratory distress.     Breath sounds: Normal breath sounds. No wheezing.  Abdominal:     General: There is no distension.     Palpations: Abdomen is soft. There is no mass.     Tenderness: There is abdominal tenderness in the left lower quadrant. There is no right CVA tenderness, left CVA tenderness, guarding or rebound.     Comments: Tenderness palpation of left lower quadrant abdomen.  No rigidity, guarding, distention.  Negative rebound.  No peritonitis.  Musculoskeletal:        General: Normal range of motion.     Cervical back: Normal range of motion and neck supple.  Skin:    General: Skin is warm and dry.  Neurological:     Mental Status: He is alert and oriented to person, place, and time.     ED Results / Procedures / Treatments   Labs (all labs ordered are listed, but only abnormal results are displayed) Labs Reviewed  LIPASE, BLOOD - Abnormal; Notable for the following components:      Result Value   Lipase 69 (*)    All other components within normal limits  COMPREHENSIVE METABOLIC PANEL - Abnormal; Notable for the following components:   Glucose, Bld 103 (*)    ALT 52 (*)    Total Bilirubin 1.4 (*)    All other components within normal limits   URINALYSIS, ROUTINE W REFLEX MICROSCOPIC - Abnormal; Notable for the following components:   Color, Urine STRAW (*)    All other components within normal limits  CBC    EKG None  Radiology CT ABDOMEN PELVIS W CONTRAST  Result Date: 05/31/2020 CLINICAL DATA:  Lower abdominal pain.  Concern for diverticulitis. EXAM: CT ABDOMEN AND PELVIS WITH CONTRAST TECHNIQUE: Multidetector CT imaging of the abdomen and pelvis was performed using the standard protocol  following bolus administration of intravenous contrast. CONTRAST:  OMNIPAQUE IOHEXOL 300 MG/ML  SOLN COMPARISON:  None. FINDINGS: Lower chest: No significant pulmonary nodules or acute consolidative airspace disease. Hepatobiliary: Normal liver size. No liver mass. Normal gallbladder with no radiopaque cholelithiasis. No biliary ductal dilatation. Pancreas: Normal, with no mass or duct dilation. Spleen: Normal size. No mass. Adrenals/Urinary Tract: Normal adrenals. No hydronephrosis. Subcentimeter hypodense lower left renal cortical lesion is too small to characterize and requires no follow-up. No suspicious contour deforming renal masses. Normal bladder. Stomach/Bowel: Normal non-distended stomach. Normal caliber small bowel with no small bowel wall thickening. Normal appendix. In the region of the proximal sigmoid colon, there is a pericolonic focus of peripheral fat stranding and central fat density (series 3/image 60), compatible with acute epiploic appendagitis. No large bowel wall or significant diverticulosis. Vascular/Lymphatic: Normal caliber abdominal aorta. Patent portal, splenic, hepatic and renal veins. No pathologically enlarged lymph nodes in the abdomen or pelvis. Reproductive: Normal size prostate. Other: No pneumoperitoneum, ascites or focal fluid collection. Musculoskeletal: No aggressive appearing focal osseous lesions. IMPRESSION: Acute epiploic appendagitis in the proximal sigmoid colon. This is a self-limiting condition  requiring only supportive care. Electronically Signed   By: Delbert Phenix M.D.   On: 05/31/2020 13:23    Procedures Procedures   Medications Ordered in ED Medications  acetaminophen (TYLENOL) tablet 1,000 mg (1,000 mg Oral Given 05/31/20 1155)  iohexol (OMNIPAQUE) 300 MG/ML solution 100 mL (100 mLs Intravenous Contrast Given 05/31/20 1317)    ED Course  I have reviewed the triage vital signs and the nursing notes.  Pertinent labs & imaging results that were available during my care of the patient were reviewed by me and considered in my medical decision making (see chart for details).    MDM Rules/Calculators/A&P                          Patient presented for evaluation of abdominal pain.  On exam, patient peers nontoxic.  No infectious symptoms including fever, nausea, vomiting.  However in the setting of left lower quadrant normal pain, concern for diverticulitis.  Also consider atypical appendicitis.  Consider viral GI illness.  Consider kidney stone.  Labs obtained from triage interpreted by me, overall reassuring.  No leukocytosis.  Lipase is minimally elevated, though this has been elevated in the past.  Likely not contributing.  Will obtain CT scan for further evaluation.  Patient is requesting to not receive any narcotic medicine.  Will give Tylenol.  CT consistent with epiploic appendagitis.  No other acute abnormalities or signs of infection.  Discussed findings with patient.  Discussed symptomatic treatment.  Discussed that this is self-limited.  Discussed concerning signs including fever or signs of infection.  At this time, patient appears safe for discharge.  Return precautions given.  Patient states he understands and agrees to plan  Final Clinical Impression(s) / ED Diagnoses Final diagnoses:  Epiploic appendagitis    Rx / DC Orders ED Discharge Orders    None       Alveria Apley, PA-C 05/31/20 1444    [name], Clabe Seal, DO 06/01/20 1610

## 2020-05-31 NOTE — ED Provider Notes (Addendum)
[facility]    CSN: [id number] Arrival date & time: 05/31/20  4967      History   Chief Complaint Chief Complaint  Patient presents with  . Abdominal Pain    Lower Right     HPI Trevor Mendez is a 40 y.o. male.   Patient presents with intermittent sharp LLQ abdominal pain 6/10  starting yesterday afternoon. Worsened at times by movement such as getting out of truck. Endorses softer stools and decreased appetite. Denies blood in stool, nausea, vomiting, urinary changes, changes in diet, fever, chills,  Shortness of breath, chest pain. History of GERD, duodenal ulcers, gastric ulcer with hemorrhage, hernia, and ETOH dependence. Currently on omperazole taken regularly. Last endoscopy 10/2018,   Past Medical History:  Diagnosis Date  . Anxiety   . DEPRESSION, MAJOR, SEVERE 09/13/2009   Qualifier: Diagnosis of  By: Debby Bud MD, Rosalyn Gess   . Duodenal ulcer   . Gastric ulcer   . Hiatal hernia   . History of alcohol abuse   . HTN (hypertension) 10/09/2011  . MIGRAINE HEADACHE 04/10/2007   Qualifier: Diagnosis of  By: Genelle Gather CMA, [place]    . Substance abuse [facility])     Patient Active Problem List   Diagnosis Date Noted  . Gastritis and gastroduodenitis 11/07/2018  . Anemia due to blood loss 11/07/2018  . Right lower quadrant abdominal pain 11/07/2018  . Duodenal ulcer   . Gastric ulcer   . Hiatal hernia   . Alcohol dependence (HCC) 09/02/2012  . 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 MIGRAINE HEADACHE 04/10/2007    Past Surgical History:  Procedure Laterality Date  . BIOPSY  05/17/2018   Procedure: BIOPSY;  Surgeon: Meridee Score Netty Starring., MD;  Location[facility] ENDOSCOPY;  Service: Gastroenterology;;  . ESOPHAGOGASTRODUODENOSCOPY (EGD) WITH PROPOFOL N/A 05/17/2018   Procedure: ESOPHAGOGASTRODUODENOSCOPY (EGD) WITH PROPOFOL;  Surgeon: Lemar Lofty., MD;  Location: [facility] ENDOSCOPY;  Service: Gastroenterology;  Laterality:  N/A;  . finger surgury    . ORIF ANKLE FRACTURE Right   . WISDOM TOOTH EXTRACTION         Home Medications    Prior to Admission medications   Medication Sig Start Date End Date Taking? Authorizing Provider  diclofenac Sodium (VOLTAREN) 1 % GEL Apply 1 application topically 4 (four) times daily. 05/26/20   Domenick Gong, MD  omeprazole (PRILOSEC) 20 MG capsule Take 1 capsule (20 mg total) by mouth 2 (two) times daily before a meal. 04/13/19   Waldon Merl, PA-C    Family History Family History  Problem Relation Age of Onset  . Hypertension Mother   . Hypertension Father   . Alcohol abuse Father   . Alcohol abuse Paternal Aunt   . Alcohol abuse Paternal Uncle   . Alcohol abuse Paternal Grandfather   . Schizophrenia Paternal Grandfather   . Alcohol abuse Paternal Grandmother   . Colon cancer Maternal Grandfather   . Esophageal cancer Neg Hx   . Pancreatic cancer Neg Hx   . Stomach cancer Neg Hx   . Inflammatory bowel disease Neg Hx     Social History Social History   Tobacco Use  . Smoking status: Former Smoker    Types: Cigarettes  . Smokeless tobacco: Former Neurosurgeon    Types: Chew    Quit date: 05/20/2018  . Tobacco comment: quit 2014  Vaping Use  . Vaping Use: Never used  Substance Use Topics  . Alcohol use: No    Comment: recovering  alcoholic -- 68 months sober.   . Drug use: No    Frequency: 30.0 times per week    Types: Benzodiazepines    Comment: previous benzo addiction     Allergies   Other and Codeine   Review of Systems Review of Systems  Constitutional: Positive for appetite change. Negative for activity change, chills, diaphoresis, fatigue, fever and unexpected weight change.  HENT: Negative.   Respiratory: Negative.   Cardiovascular: Negative.   Gastrointestinal: Positive for abdominal pain and diarrhea. Negative for abdominal distention, anal bleeding, blood in stool, constipation, nausea, rectal pain and vomiting.  Genitourinary:  Negative.   Musculoskeletal: Negative.   Skin: Negative.   Neurological: Negative.   Hematological: Negative.      Physical Exam Triage Vital Signs ED Triage Vitals  Enc Vitals Group     BP 05/31/20 0848 (!) 161/94     Pulse Rate 05/31/20 0848 66     Resp 05/31/20 0848 16     Temp 05/31/20 0848 97.8 F (36.6 C)     Temp Source 05/31/20 0848 Oral     SpO2 05/31/20 0848 96 %     Weight --      Height --      Head Circumference --      Peak Flow --      Pain Score 05/31/20 0845 6     Pain Loc --      Pain Edu? --      Excl. in GC? --    No data found.  Updated Vital Signs BP (!) 161/94 (BP Location: Left Arm)   Pulse 66   Temp 97.8 F (36.6 C) (Oral)   Resp 16   SpO2 96%   Visual Acuity Right Eye Distance:   Left Eye Distance:   Bilateral Distance:    Right Eye Near:   Left Eye Near:    Bilateral Near:     Physical Exam Constitutional:      Appearance: He is well-developed and normal weight.  HENT:     Head: Normocephalic.  Pulmonary:     Effort: Pulmonary effort is normal.  Abdominal:     General: Abdomen is flat.     Palpations: Abdomen is soft.     Tenderness: There is abdominal tenderness in the left lower quadrant. There is guarding.     Comments: Pressure applied to RLQ causing LLQ pain, patient jerked from table when palpating LLQ   Skin:    General: Skin is warm and dry.  Neurological:     Mental Status: He is alert and oriented to person, place, and time.  Psychiatric:        Mood and Affect: Mood normal.        Behavior: Behavior normal.      UC Treatments / Results  Labs (all labs ordered are listed, but only abnormal results are displayed) Labs Reviewed - No data to display  EKG   Radiology No results found.  Procedures Procedures (including critical care time)  Medications Ordered in UC Medications - No data to display  Initial Impression / Assessment and Plan / UC Course  I have reviewed the triage vital signs and the  nursing notes.  Pertinent labs & imaging results that were available during my care of the patient were reviewed by me and considered in my medical decision making (see chart for details).  Acute LLQ abdominal pain   Degree of pain elicited from exam concerning, patient advised to go to emergency  department for further evaluation for possible CT. Discussed with patient risk and benefits of further imaging and evaluation. Agreeable to plan of care.  Final Clinical Impressions(s) / UC Diagnoses   Final diagnoses:  None   Discharge Instructions   None    ED Prescriptions    None     PDMP not reviewed this encounter.   Valinda Hoar, NP 05/31/20 0918    Valinda Hoar, NP 05/31/20 [phone]

## 2020-05-31 NOTE — ED Triage Notes (Signed)
Pt presents with Indigestion that comes and goes, loss of appetite, lose stool that started yesterday. States has hx of ulcers but denies any blood in stools or vomiting blood.   States was unable to get an appt with GI doctor until April.

## 2020-05-31 NOTE — ED Triage Notes (Signed)
C/o left lower abd. Pain onset  yest at lunch  lbm this am states it was soft.

## 2020-05-31 NOTE — Discharge Instructions (Signed)
Your CT showed epiploic appendagitis, which is mostly an inflammatory condition. Treatment for this is pain control- it will improve on its own. Use Tylenol as needed for pain.  You may also use warm compresses to help with pain. Return to the emergency room if you develop high fevers, persistent vomiting, severe worsening pain, blood in your stool, or any new, worsening, or concerning symptoms

## 2020-05-31 NOTE — ED Notes (Signed)
Patient is being discharged from the Urgent Care and sent to the Emergency Department via POV . Per A. White, NP , patient is in need of higher level of care due to LLQ pain upon palpation with hx of ulcers. Patient is aware and verbalizes understanding of plan of care.  Vitals:   05/31/20 0848  BP: (!) 161/94  Pulse: 66  Resp: 16  Temp: 97.8 F (36.6 C)  SpO2: 96%

## 2020-07-13 ENCOUNTER — Encounter: Payer: Self-pay | Admitting: Gastroenterology

## 2020-07-13 ENCOUNTER — Ambulatory Visit (INDEPENDENT_AMBULATORY_CARE_PROVIDER_SITE_OTHER): Payer: 59 | Admitting: Gastroenterology

## 2020-07-13 ENCOUNTER — Other Ambulatory Visit (INDEPENDENT_AMBULATORY_CARE_PROVIDER_SITE_OTHER): Payer: 59

## 2020-07-13 VITALS — BP 136/94 | HR 68 | Ht 69.0 in | Wt 178.5 lb

## 2020-07-13 DIAGNOSIS — R1013 Epigastric pain: Secondary | ICD-10-CM

## 2020-07-13 DIAGNOSIS — R195 Other fecal abnormalities: Secondary | ICD-10-CM

## 2020-07-13 DIAGNOSIS — K625 Hemorrhage of anus and rectum: Secondary | ICD-10-CM | POA: Diagnosis not present

## 2020-07-13 DIAGNOSIS — K6389 Other specified diseases of intestine: Secondary | ICD-10-CM

## 2020-07-13 DIAGNOSIS — K648 Other hemorrhoids: Secondary | ICD-10-CM

## 2020-07-13 LAB — TSH: TSH: 1.09 u[IU]/mL (ref 0.35–4.50)

## 2020-07-13 LAB — C-REACTIVE PROTEIN: CRP: <1 mg/dL (ref 0.5–20.0)

## 2020-07-13 LAB — SEDIMENTATION RATE: Sed Rate: 15 mm/hr (ref 0–15)

## 2020-07-13 NOTE — Patient Instructions (Addendum)
Your provider has requested that you go to the basement level for lab work before leaving today. Press "B" on the elevator. The lab is located at the first door on the left as you exit the elevator.  Please purchase the following medications over the counter and take as directed: Metamucil OR Benefiber-- Take once daily x 1 week, then increase to twice daily thereafter.  Please follow up with Dr Meridee Score on 08/29/20 at 3:10 pm.  If you are age 39 or younger, your body mass index should be between 19-25. Your Body mass index is 26.36 kg/m. If this is out of the aformentioned range listed, please consider follow up with your Primary Care Provider.   Due to recent changes in healthcare laws, you may see the results of your imaging and laboratory studies on MyChart before your provider has had a chance to review them.  We understand that in some cases there may be results that are confusing or concerning to you. Not all laboratory results come back in the same time frame and the provider may be waiting for multiple results in order to interpret others.  Please give [place] 48 hours in order for your provider to thoroughly review all the results before contacting the office for clarification of your results.   It was a pleasure to see you today!  Dr. Meridee Score

## 2020-07-15 ENCOUNTER — Encounter: Payer: Self-pay | Admitting: Gastroenterology

## 2020-07-15 NOTE — Progress Notes (Signed)
GASTROENTEROLOGY OUTPATIENT CLINIC VISIT   Primary Care Provider Patient, No Pcp Per (Inactive) No address on file None  Patient Profile: Trevor Mendez is a 40 y.o. male with a pmh significant for anxiety/depression, previous alcohol use disorder (now abstinent), hypertension, migraines, peptic ulcer disease (manifested on endoscopy in setting of hospitalization for melena), hiatal hernia, GERD, prior epiploic appendagitis.  The patient presents to the [facility] for an evaluation and management of problem(s) noted below:  Problem List 1. Dyspepsia   2. BRBPR (bright red blood per rectum)   3. Internal hemorrhoids   4. Loose bowel movements   5. History of epiploic appendagitis     History of Present Illness Please see initial consultation note and prior progress notes for full details of HPI.   Interval History The patient presents for a follow-up after recent emergency department evaluation showing evidence of epiploic appendagitis.  Patient presented with significant abdominal pain for a few days and then came into the hospital and was found to have epiploic appendagitis on cross-sectional imaging.  Within the next 36 hours pain subsided.  He has not had any recurrence.  Patient's main issues at this time are to see if there is anything that can be done in regards to preventing recurrent epiploic appendagitis as well as chronic softer bowel movements/diarrhea that occur and have persisted over the course of the last few years.  Patient's weight is stable.  He has no nocturnal symptoms.  The patient does have bright red blood per rectum on the toilet paper a few times per week.  No unintentional weight loss.  No family history of GI malignancies.  The patient continues to take his twice daily omeprazole.  If he misses any dose he will have significant issues of indigestion and dyspepsia.  He is worried about continued PPI use however in the long-term.  GI  Review of Systems Positive as above Negative for odynophagia, early satiety, pain, bloating, melena   Review of Systems General: Denies fever/chills/unintentional weight loss HEENT: Denies oral lesions Cardiovascular: Denies chest pain Pulmonary: Denies shortness of breath Gastroenterological: See HPI Genitourinary: Denies darkened urine Hematological: Denies easy bruising/bleeding Endocrine: Denies temperature intolerance Dermatological: Denies jaundice Psychological: Mood is stable   Medications Current Outpatient Medications  Medication Sig Dispense Refill  . loratadine (CLARITIN) 10 MG tablet Take 10 mg by mouth daily.    Marland Kitchen omeprazole (PRILOSEC) 20 MG capsule Take 1 capsule (20 mg total) by mouth 2 (two) times daily before a meal. 60 capsule 1  . zinc gluconate 50 MG tablet Take 50 mg by mouth daily.     No current facility-administered medications for this visit.    Allergies Allergies  Allergen Reactions  . Other Other (See Comments)    NARCOTICS. 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  . Codeine Nausea And Vomiting    Histories Past Medical History:  Diagnosis Date  . Anxiety   . DEPRESSION, MAJOR, SEVERE 09/13/2009   Qualifier: Diagnosis of  By: Debby Bud MD, Rosalyn Gess   . Duodenal ulcer   . Gastric ulcer   . Hiatal hernia   . History of alcohol abuse   . HTN (hypertension) 10/09/2011  . MIGRAINE HEADACHE 04/10/2007   Qualifier: Diagnosis of  By: Genelle Gather CMA, [place]    . Substance abuse [facility])    Past Surgical History:  Procedure Laterality Date  . BIOPSY  05/17/2018   Procedure: BIOPSY;  Surgeon: Meridee Score Netty Starring., MD;  Location: [facility]  ENDOSCOPY;  Service: Gastroenterology;;  . ESOPHAGOGASTRODUODENOSCOPY (EGD) WITH PROPOFOL N/A 05/17/2018   Procedure: ESOPHAGOGASTRODUODENOSCOPY (EGD) WITH PROPOFOL;  Surgeon: Meridee Score Netty Starring., MD;  Location: [facility] ENDOSCOPY;  Service: Gastroenterology;  Laterality: N/A;  . finger surgury    . ORIF ANKLE  FRACTURE Right   . WISDOM TOOTH EXTRACTION     Social History   Socioeconomic History  . Marital status: Married    Spouse name: Not on file  . Number of children: 1  . Years of education: 22  . Highest education level: Not on file  Occupational History  . Occupation: Chiropodist: [employer]  Tobacco Use  . Smoking status: Former Smoker    Types: Cigarettes  . Smokeless tobacco: Former Neurosurgeon    Types: Chew    Quit date: 05/20/2018  . Tobacco comment: quit 2014  Vaping Use  . Vaping Use: Never used  Substance and Sexual Activity  . Alcohol use: No    Comment: recovering alcoholic -- 68 months sober.   . Drug use: No    Frequency: 30.0 times per week    Types: Benzodiazepines    Comment: previous benzo addiction  . Sexual activity: Yes    Partners: Female  Other Topics Concern  . 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      09/03/2012 [name] was born and grew up in [place], [place]. He has a younger sister. His parents are still together and healthy. He graduated from high school, and has all but a degree at [employer] in Musician. 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 He has been married for 5 years, and has an 48-month-old daughter. He currently lives with his wife and daughter. 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 His social support system consists of his sponsor, wife, mother, father, sister, brother-in-law, and friends. He affiliates as a International aid/development worker. He denies any legal problems. He enjoys golf and firearms. 09/03/2012 AHW   Social Determinants of Health   Financial Resource Strain: Not on file  Food Insecurity: Not on file  Transportation Needs: Not on file  Physical Activity: Not on file  Stress: Not on file  Social Connections: Not on file  Intimate Partner Violence: Not on file   Family History   Problem Relation Age of Onset  . Hypertension Mother   . Hypertension Father   . Alcohol abuse Father   . Alcohol abuse Paternal Aunt   . Alcohol abuse Paternal Uncle   . Alcohol abuse Paternal Grandfather   . Schizophrenia Paternal Grandfather   . Alcohol abuse Paternal Grandmother   . Colon cancer Maternal Grandfather   . Esophageal cancer Neg Hx   . Pancreatic cancer Neg Hx   . Stomach cancer Neg Hx   . Inflammatory bowel disease Neg Hx    I have reviewed his medical, social, and family history in detail and updated the electronic medical record as necessary.    PHYSICAL EXAMINATION  BP (!) 136/94 (BP Location: Left Arm, Patient Position: Sitting, Cuff Size: Normal)   Pulse 68   Ht 5\' 9"  (1.753 m)   Wt 178 lb 8 oz (81 kg)   BMI 26.36 kg/m  Wt Readings from Last 3 Encounters:  07/13/20 178 lb 8 oz (81 kg)  05/31/20 170 lb (77.1 kg)  11/23/19 172 lb (78 kg)  GEN: NAD, appears stated age, doesn't appear chronically ill PSYCH: Cooperative, without pressured speech EYE: Conjunctivae pink, sclerae anicteric ENT: Masked CV: RR without R/Gs  RESP: CTAB posteriorly GI: NABS, soft, NT/ND, without rebound or guarding, no HSM appreciated GU: Perianal exam shows no excoriations or significant external hemorrhoidal disease, digital rectal exam with normal perineal descent and evidence of internal hemorrhoids on palpation MSK/EXT: No lower extremity edema SKIN: No jaundice NEURO:  Alert & Oriented x 3, no focal deficits   REVIEW OF DATA  I reviewed the following data at the time of this encounter:  GI Procedures and Studies  Previously reviewed  Laboratory Studies  Reviewed in epic  Imaging Studies  March 2022 CT abdomen pelvis with contrast IMPRESSION: Acute epiploic appendagitis in the proximal sigmoid colon. This is a self-limiting condition requiring only supportive care.   ASSESSMENT  Mr. Robbins is a 40 y.o. male with a pmh significant for anxiety/depression,  previous alcohol use disorder (now abstinent), hypertension, migraines, peptic ulcer disease (manifested on endoscopy in setting of hospitalization for melena), hiatal hernia, GERD, prior epiploic appendagitis.  The patient is seen today for evaluation and management of:  1. Dyspepsia   2. BRBPR (bright red blood per rectum)   3. Internal hemorrhoids   4. Loose bowel movements   5. History of epiploic appendagitis    The patient is hemodynamically stable.  Longer standing issues of looser bowel movements are patient's main concern at this time.  We have discussed bulking his stool with Metamucil or Benefiber in an effort of trying to optimize his bowel habits.  It certainly could be a result of his continued PPI use over the long-term.  Endoscopic evaluation from below may be necessary at some point in the future.  Bright red blood per rectum most likely internal hemorrhoidal disease but if this persists we also will need to consider endoscopic evaluation from below ensure nothing else is being missed and we will transition some of his toileting techniques.  Hopefully he never has another bout of epiploic appendagitis but certainly will be at risk of torsion in the future if this is happened once but hopefully will not.  For now we will continue twice daily PPI in an effort of optimization of his dyspepsia.  At some point we will need to get him off of this program to the lowest effective dose however when we have decreased dose previously he has progressive symptoms.  All patient questions were answered to the best of my ability, and the patient agrees to the aforementioned plan of action with follow-up as indicated.   PLAN  Laboratories as outlined below Stool studies as outlined below Metamucil or Benefiber once daily Preparation H cream/ointment 2-4 times daily as needed Wet wipe initially and then may dry with normal toilet paper If bright red blood per rectum persists or symptoms of softer  bowel movements persist will pursue colonoscopic evaluation   Orders Placed This Encounter  Procedures  . Stool culture  . Ova and parasite examination  . TSH  . Tissue transglutaminase, IgA  . Sedimentation rate  . C-reactive protein  . Clostridium difficile Toxin B, Qualitative, Real-Time PCR  . Pancreatic elastase, fecal  . IgA    New Prescriptions   No medications on file   Modified Medications   No medications on file    Planned Follow Up No follow-ups on file.   Total Time in Face-to-Face and in Coordination of Care for patient including independent/personal interpretation/review of  prior testing, medical history, examination, medication adjustment, communicating results with the patient directly, and documentation with the EHR is 25 minutes.   Justice Britain, MD [facility] Office # [phone]

## 2020-07-16 DIAGNOSIS — R1013 Epigastric pain: Secondary | ICD-10-CM | POA: Insufficient documentation

## 2020-07-16 DIAGNOSIS — K6389 Other specified diseases of intestine: Secondary | ICD-10-CM | POA: Insufficient documentation

## 2020-07-16 DIAGNOSIS — R195 Other fecal abnormalities: Secondary | ICD-10-CM | POA: Insufficient documentation

## 2020-07-16 DIAGNOSIS — K648 Other hemorrhoids: Secondary | ICD-10-CM | POA: Insufficient documentation

## 2020-07-16 DIAGNOSIS — K625 Hemorrhage of anus and rectum: Secondary | ICD-10-CM | POA: Insufficient documentation

## 2020-07-16 LAB — TISSUE TRANSGLUTAMINASE, IGA: (tTG) Ab, IgA: <1 U/mL

## 2020-07-16 LAB — IGA: Immunoglobulin A: 212 mg/dL (ref 47–310)

## 2020-08-29 ENCOUNTER — Ambulatory Visit: Payer: 59 | Admitting: Gastroenterology

## 2021-05-02 ENCOUNTER — Encounter: Payer: Self-pay | Admitting: Family Medicine

## 2021-05-02 ENCOUNTER — Ambulatory Visit: Payer: 59 | Admitting: Registered Nurse

## 2021-05-02 ENCOUNTER — Ambulatory Visit: Payer: 59 | Admitting: Family Medicine

## 2021-05-02 VITALS — BP 120/80 | HR 66 | Temp 98.2°F | Resp 16 | Wt 184.0 lb

## 2021-05-02 DIAGNOSIS — G44209 Tension-type headache, unspecified, not intractable: Secondary | ICD-10-CM | POA: Diagnosis not present

## 2021-05-02 DIAGNOSIS — M62838 Other muscle spasm: Secondary | ICD-10-CM | POA: Diagnosis not present

## 2021-05-02 DIAGNOSIS — H6123 Impacted cerumen, bilateral: Secondary | ICD-10-CM

## 2021-05-02 MED ORDER — DICLOFENAC SODIUM 1 % EX GEL: 4.0000 g | Freq: Four times a day (QID) | CUTANEOUS | 3 refills | Status: AC

## 2021-05-02 MED ORDER — METHOCARBAMOL 500 MG PO TABS: 500.0000 mg | ORAL_TABLET | Freq: Three times a day (TID) | ORAL | 0 refills | Status: AC | PRN

## 2021-05-02 NOTE — Progress Notes (Signed)
° °  Subjective:    Patient ID: Trevor Mendez, male    DOB: 04-05-1980, 41 y.o.   MRN: [id number]  HPI Cerumen impaction- pt reports he has had sxs x2-3 weeks.  Wife fits people for hearing aids and told him he has wax buildup.  Has been using qtips and wears ear plugs at work.  + pain w/ sneezing and hiccups.  No dizziness.  HAs- hx of migraines w/ aura.  Not currently on medication.  Manages w/ caffeine.  'different HA' started last summer and lasted 3-4 months and then resolved.  Sxs started again this week.  Has had 4-5 HAs.  Pain in back of head, worse w/ movement.  Episodes last 10 minutes to hours.  Occur during work hours.  No noted trigger.  No aura or photo/phonophobia.  No dizziness, no N/V.  Takes daily antihistamine.   Review of Systems For ROS see HPI   This visit occurred during the SARS-CoV-2 public health emergency.  Safety protocols were in place, including screening questions prior to the visit, additional usage of staff PPE, and extensive cleaning of exam room while observing appropriate contact time as indicated for disinfecting solutions.      Objective:   Physical Exam Vitals reviewed.  Constitutional:      General: He is not in acute distress.    Appearance: Normal appearance.  HENT:     Head: Normocephalic and atraumatic.     Right Ear: There is impacted cerumen.     Left Ear: There is impacted cerumen.  Eyes:     Extraocular Movements: Extraocular movements intact.     Conjunctiva/sclera: Conjunctivae normal.     Pupils: Pupils are equal, round, and reactive to light.  Neck:     Comments: Bilateral trap spasm w/ TTP Musculoskeletal:        General: Tenderness (over traps bilaterally) present.  Skin:    General: Skin is warm and dry.  Neurological:     General: No focal deficit present.     Mental Status: He is alert and oriented to person, place, and time.  Psychiatric:        Mood and Affect: Mood normal.        Behavior: Behavior normal.         Thought Content: Thought content normal.          Assessment & Plan:  Impacted Cerumen- new.  Bilaterally.  Pt consented for wax removal and at first curette was used to remove wax but as I was not able to get to the wax against the drum, both ears were irrigated.  Pt tolerated procedure w/o difficulty and was pleased w/ immediate results.  Tension HA/Trap spasm- new.  Pt is not able to tolerate NSAIDs due to hx of gastric ulcers and GI bleeds so will start Voltaren gel on neck and shoulders.  Not able to take any pain medication due to hx of abuse.  Will start Methocarbamol to use as needed.  Discussed the importance of posture to avoid spasm, pain, and HAs.  Pt expressed understanding and is in agreement w/ plan.

## 2021-05-02 NOTE — Patient Instructions (Signed)
Follow up as needed or as scheduled Your headaches appear to be tension headaches due to muscle spasm/tightness USE the voltaren gel up to 4x/day on neck and upper shoulders HEAT! Use the Methocarbamol (muscle relaxer) prior to bed and as needed during the day- but may cause drowsiness Try and work on your posture Call with any questions or concerns Have a great weekend!!!

## 2022-10-21 IMAGING — DX DG ANKLE COMPLETE 3+V*R*
3 series · 3 of 3 positions shown · non-contrast
Comparison: Ankle radiograph 03/05/2016

CLINICAL DATA: Right ankle pain after twisting injury.

EXAM:
RIGHT ANKLE - COMPLETE 3+ VIEW

[ankle ap]
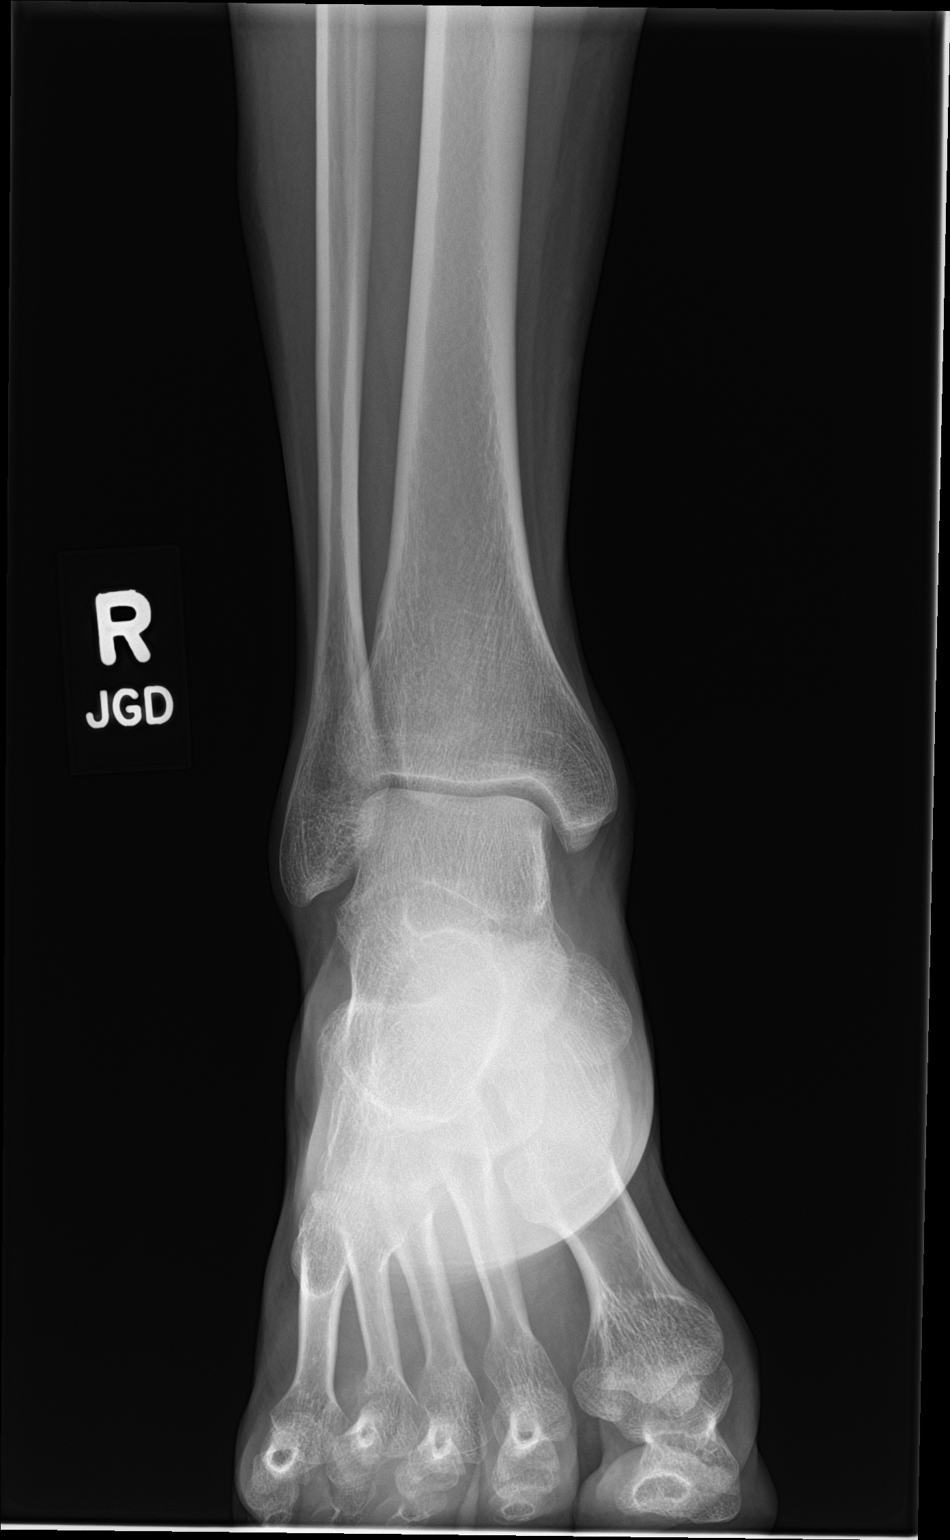

[ankle obl]
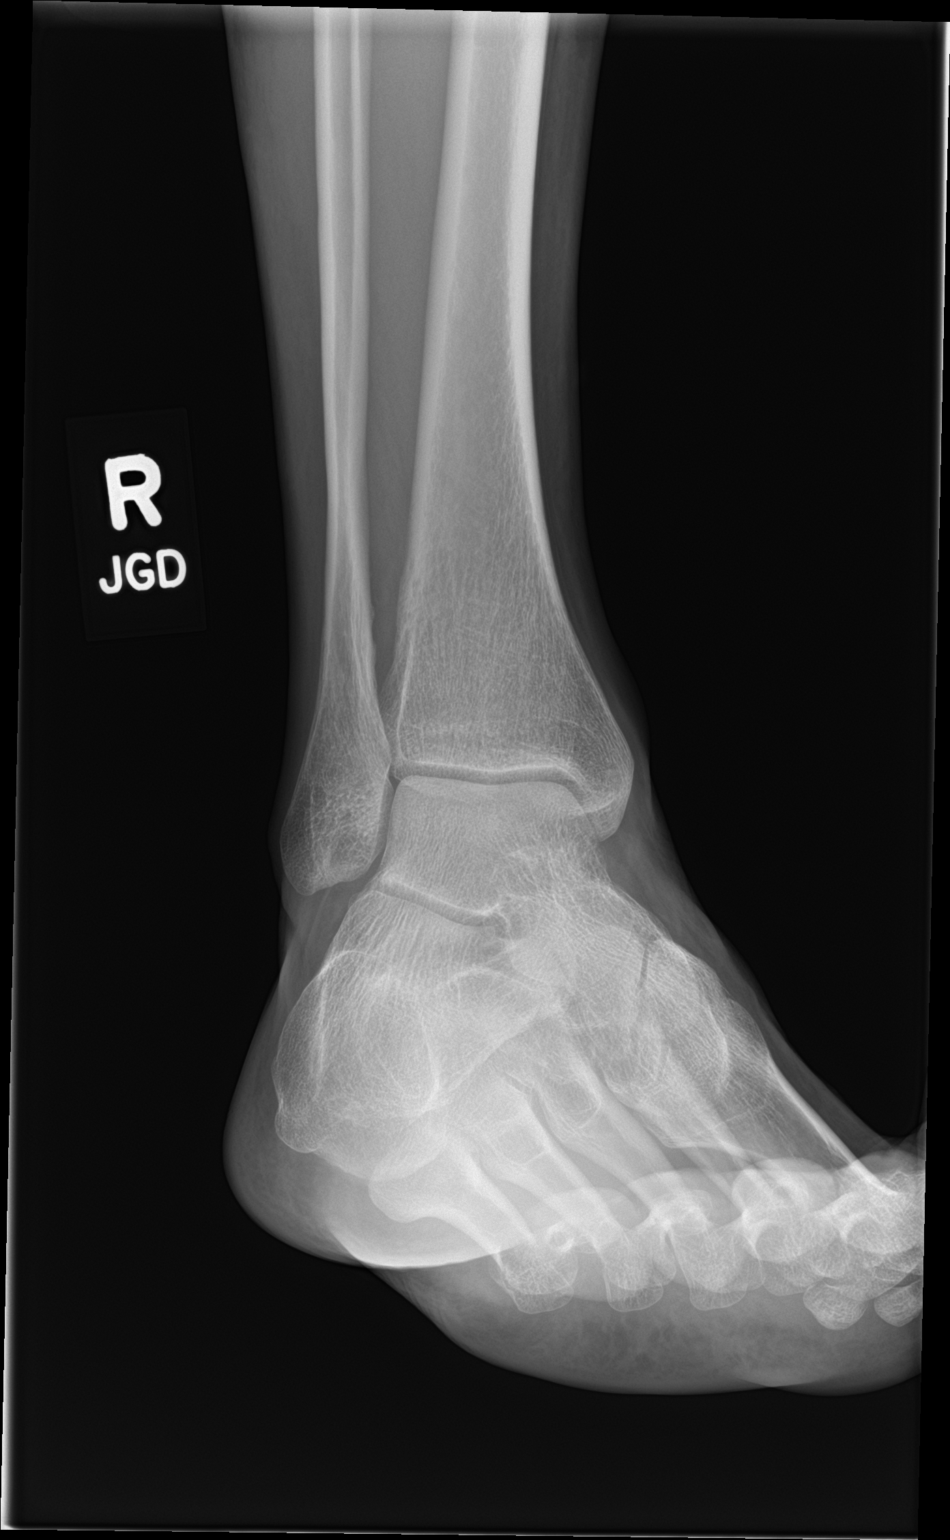

[ankle lat]
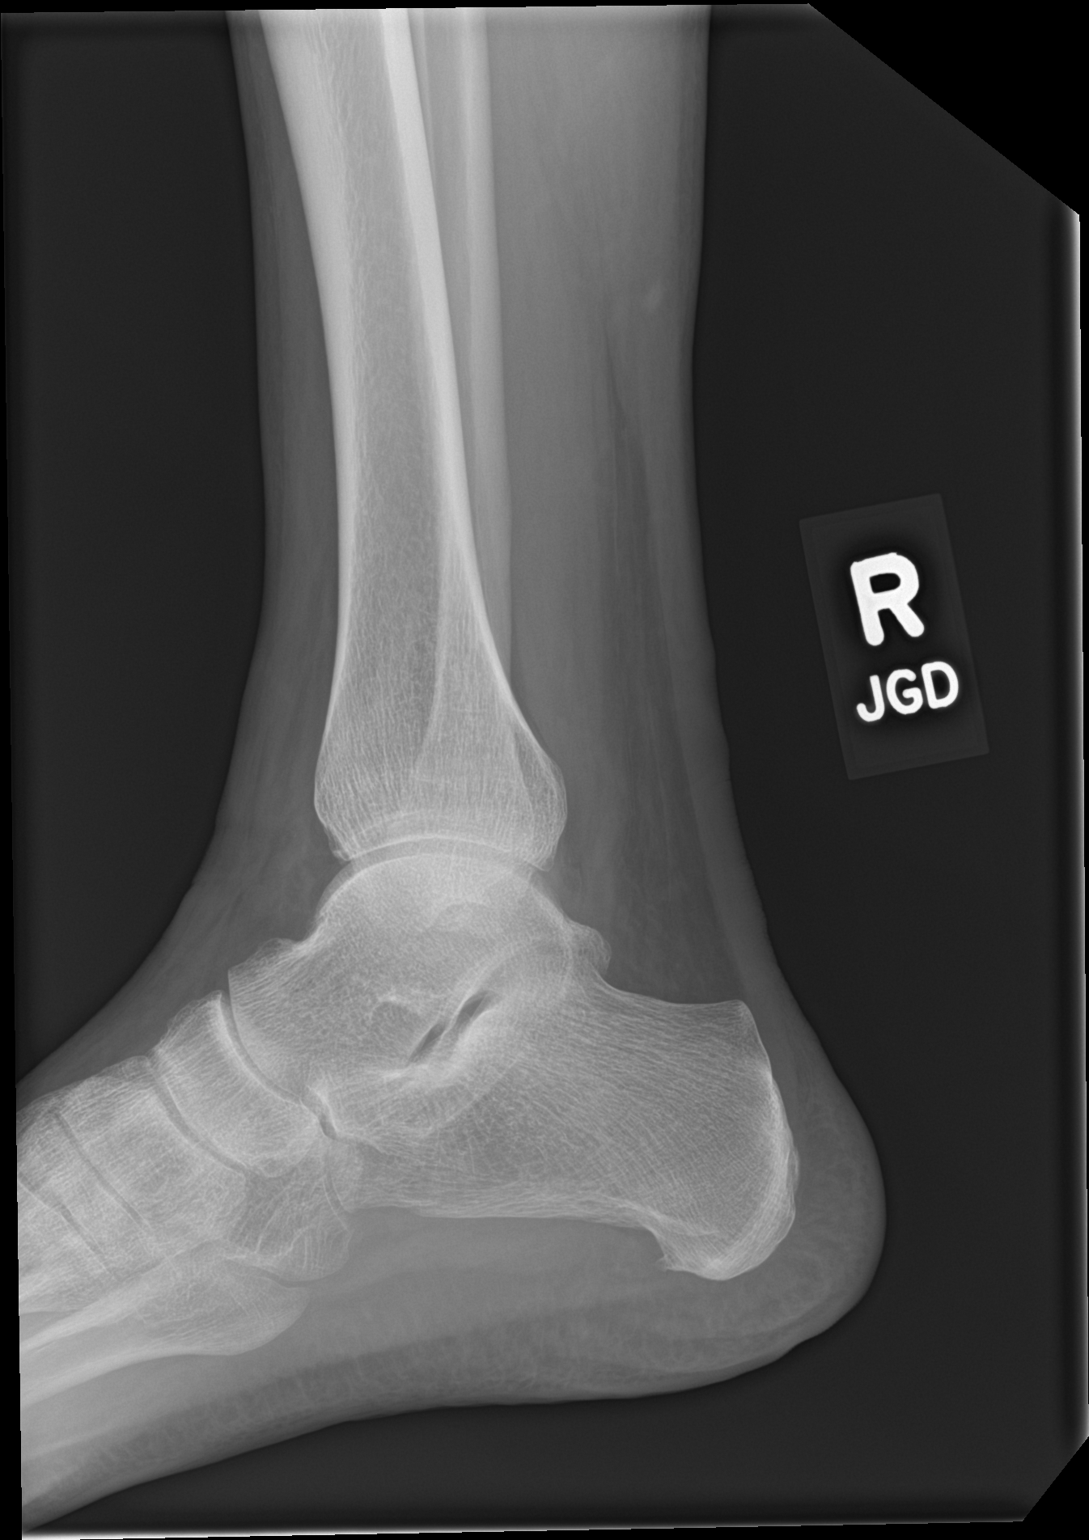

[3 of 3 positions shown; findings below may reference images not displayed]

FINDINGS: There is no evidence of fracture, dislocation, or joint effusion.
Ankle mortise is preserved. There is no evidence of arthropathy or
other focal bone abnormality. Tiny plantar calcaneal spur. Soft
tissues are unremarkable.
IMPRESSION: No fracture or subluxation of the right ankle.

## 2022-10-26 IMAGING — CT CT ABD-PELV W/ CM
2 of 4 series · 16 of 46 positions shown, 18 images · IV contrast (APPLIED)
Comparison: None.

CLINICAL DATA: Lower abdominal pain.  Concern for diverticulitis.

EXAM:
CT ABDOMEN AND PELVIS WITH CONTRAST
TECHNIQUE: Multidetector CT imaging of the abdomen and pelvis was performed
using the standard protocol following bolus administration of
intravenous contrast.
CONTRAST:  100mL OMNIPAQUE IOHEXOL 300 MG/ML  SOLN

[Series 3: abd/ pelvis 5.0 i30f 2 · axial · 0.73mm/px · z∈[+669,+1144]mm · 13 of 103 slices shown, 15 images]
[im 4/103  soft-tissue]
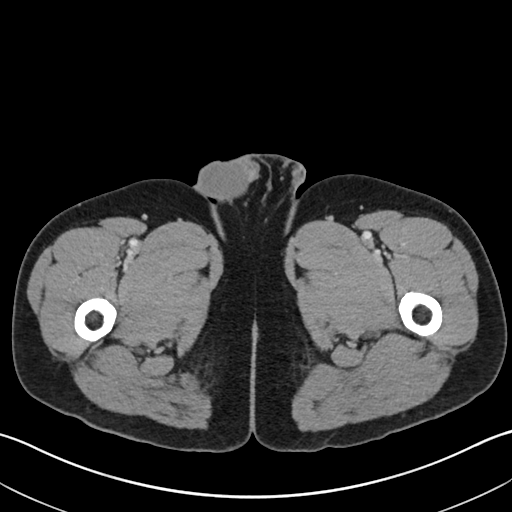
[im 4/103  bone]
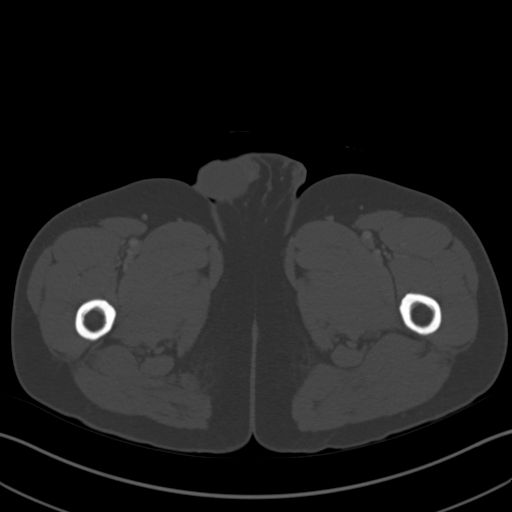
[im 12/103  soft-tissue]
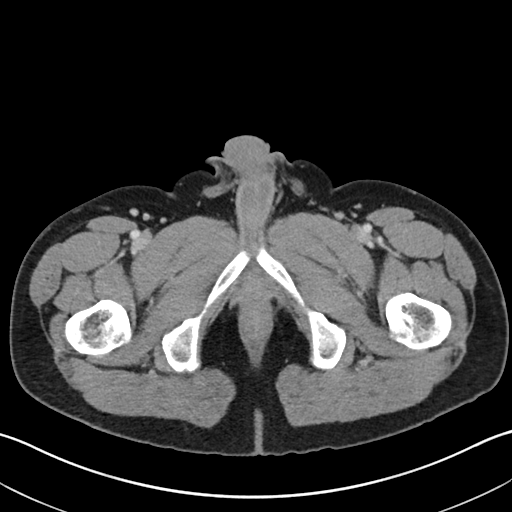
[im 20/103  soft-tissue]
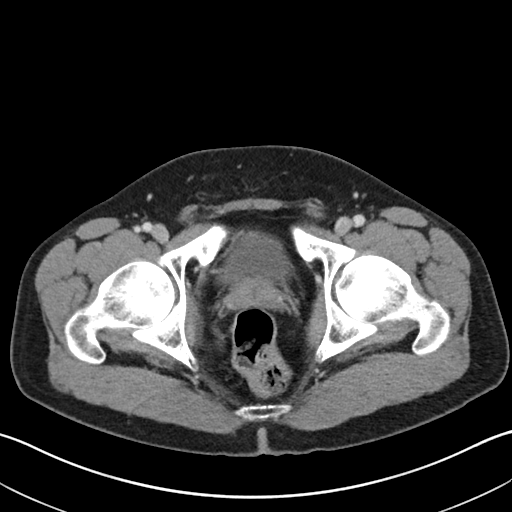
[im 28/103  soft-tissue]
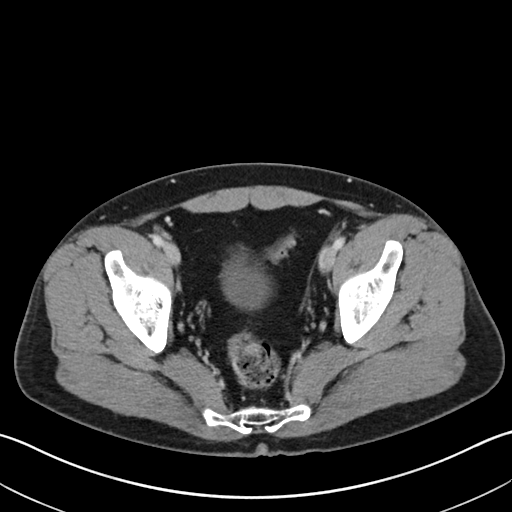
[im 36/103  soft-tissue]
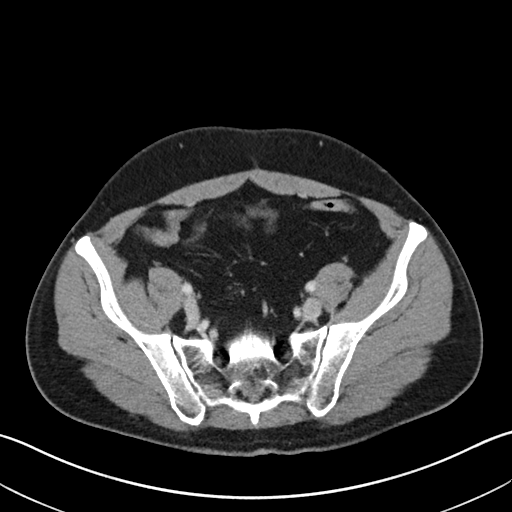
[im 44/103  soft-tissue]
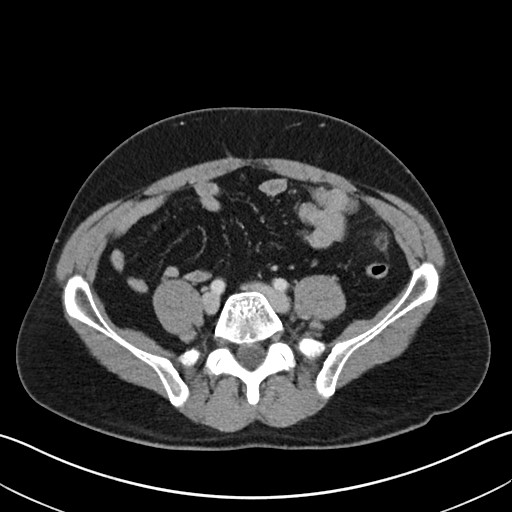
[im 52/103  soft-tissue]
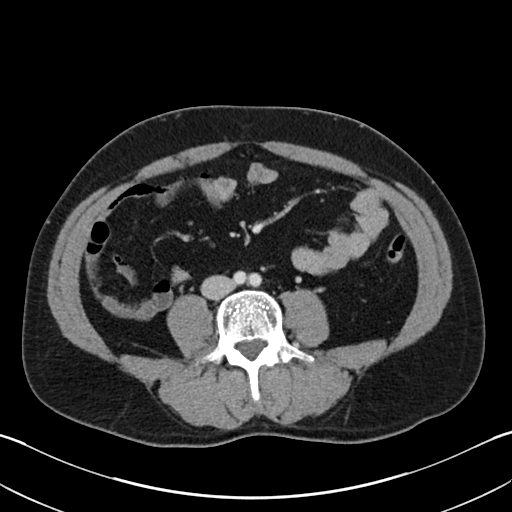
[im 59/103  soft-tissue]
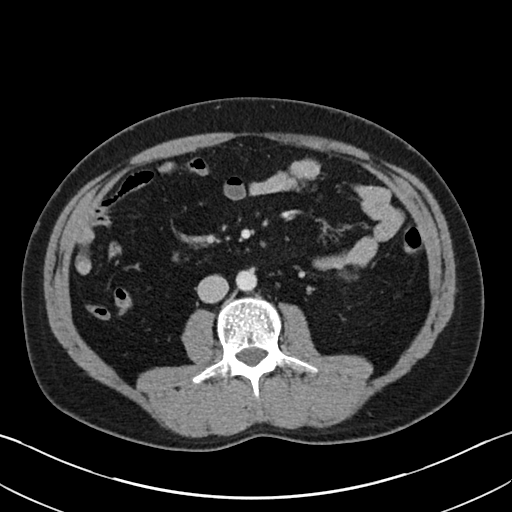
[im 67/103  soft-tissue]
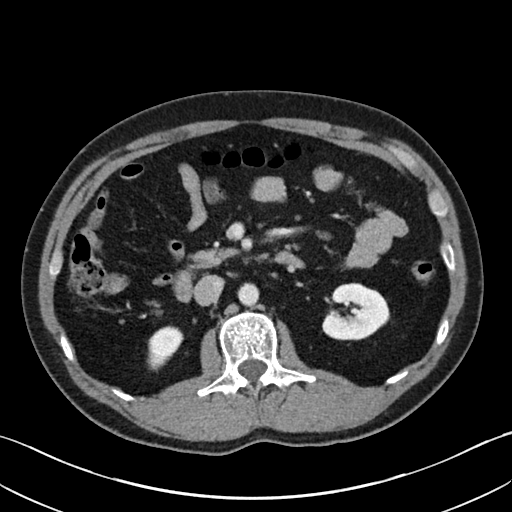
[im 67/103  bone]
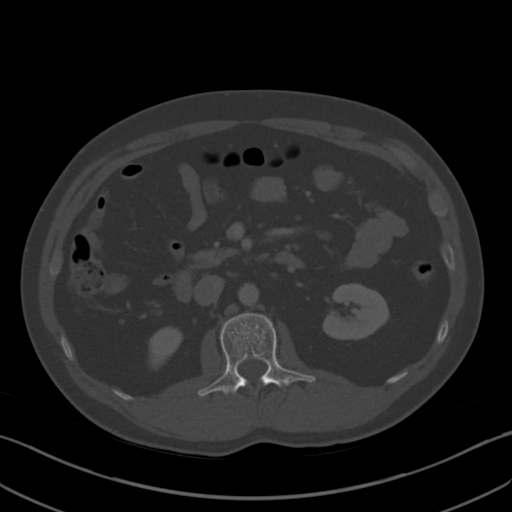
[im 75/103  soft-tissue]
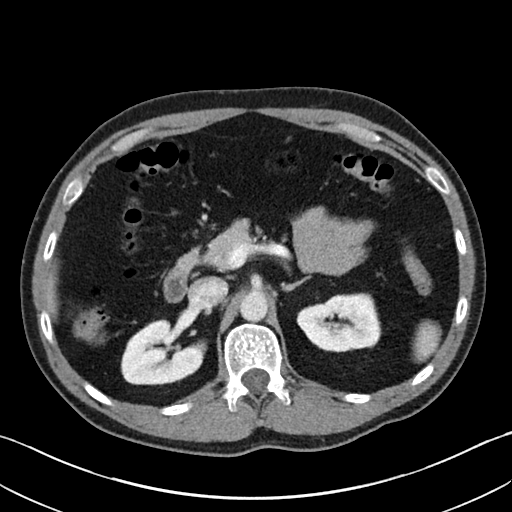
[im 83/103  soft-tissue]
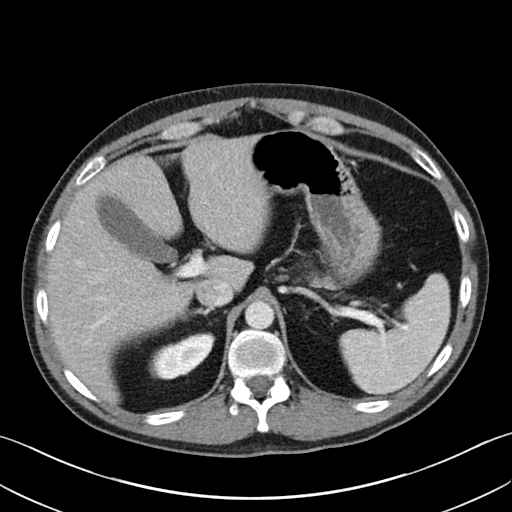
[im 91/103  soft-tissue]
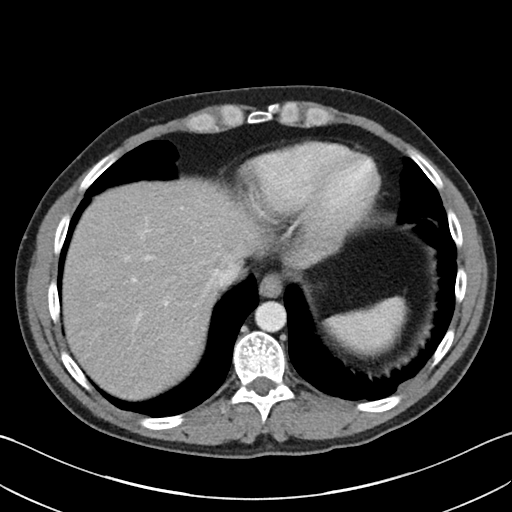
[im 99/103  soft-tissue]
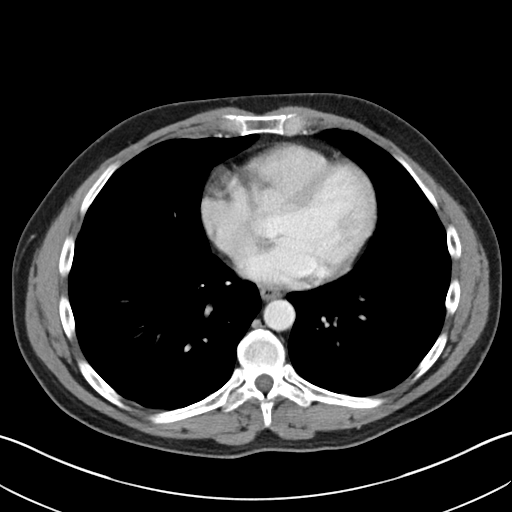

[Series 6: coronal soft tissue · coronal · 0.82mm/px · 3 of 100 slices shown]
[im 34/100  soft-tissue]
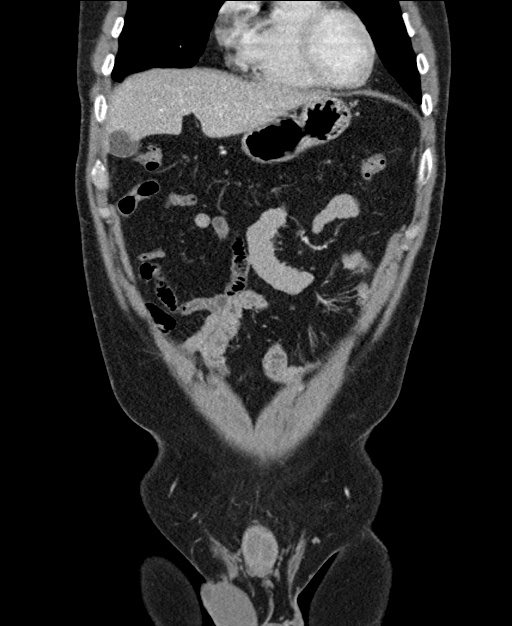
[im 45/100  soft-tissue]
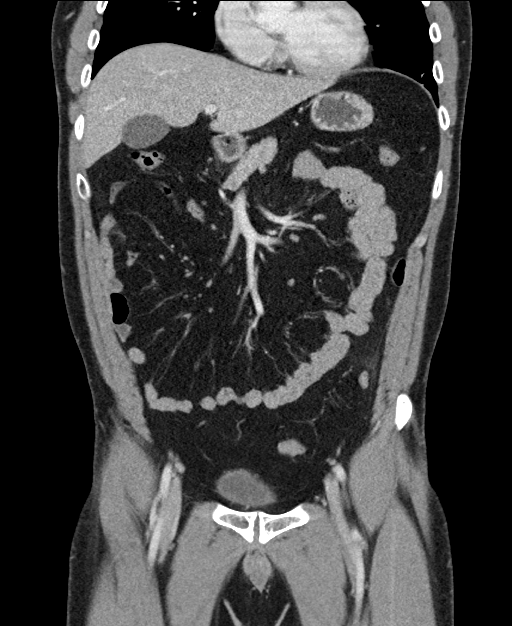
[im 56/100  soft-tissue]
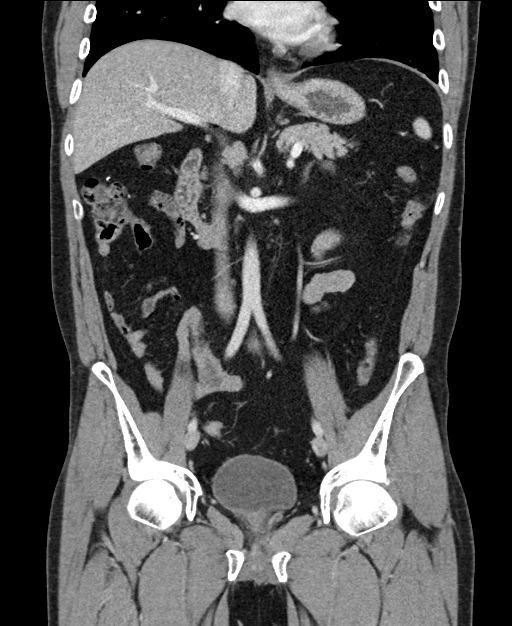

[16 of 46 positions shown; findings below may reference images not displayed]

FINDINGS: Lower chest: No significant pulmonary nodules or acute consolidative
airspace disease.

Hepatobiliary: Normal liver size. No liver mass. Normal gallbladder
with no radiopaque cholelithiasis. No biliary ductal dilatation.

Pancreas: Normal, with no mass or duct dilation.

Spleen: Normal size. No mass.

Adrenals/Urinary Tract: Normal adrenals. No hydronephrosis.
Subcentimeter hypodense lower left renal cortical lesion is too
small to characterize and requires no follow-up. No suspicious
contour deforming renal masses. Normal bladder.

Stomach/Bowel: Normal non-distended stomach. Normal caliber small
bowel with no small bowel wall thickening. Normal appendix. In the
region of the proximal sigmoid colon, there is a pericolonic focus
of peripheral fat stranding and central fat density (series 3/image
60), compatible with acute epiploic appendagitis. No large bowel
wall or significant diverticulosis.

Vascular/Lymphatic: Normal caliber abdominal aorta. Patent portal,
splenic, hepatic and renal veins. No pathologically enlarged lymph
nodes in the abdomen or pelvis.

Reproductive: Normal size prostate.

Other: No pneumoperitoneum, ascites or focal fluid collection.

Musculoskeletal: No aggressive appearing focal osseous lesions.
IMPRESSION: Acute epiploic appendagitis in the proximal sigmoid colon. This is a
self-limiting condition requiring only supportive care.
